# Patient Record
Sex: Male | Born: 1965
Health system: Southern US, Community
[De-identification: ages and names within clinical notes are randomized; demographics above are authoritative.]

## PROBLEM LIST (undated history)

## (undated) DIAGNOSIS — R011 Cardiac murmur, unspecified: Secondary | ICD-10-CM

## (undated) DIAGNOSIS — K219 Gastro-esophageal reflux disease without esophagitis: Secondary | ICD-10-CM

## (undated) DIAGNOSIS — J301 Allergic rhinitis due to pollen: Secondary | ICD-10-CM

## (undated) DIAGNOSIS — M1990 Unspecified osteoarthritis, unspecified site: Secondary | ICD-10-CM

## (undated) HISTORY — DX: Gastro-esophageal reflux disease without esophagitis: K21.9

## (undated) HISTORY — DX: Unspecified osteoarthritis, unspecified site: M19.90

## (undated) HISTORY — PX: SKIN CANCER EXCISION: SHX779

## (undated) HISTORY — PX: BILATERAL KNEE ARTHROSCOPY: SUR91

## (undated) HISTORY — DX: Cardiac murmur, unspecified: R01.1

## (undated) HISTORY — DX: Allergic rhinitis due to pollen: J30.1

## (undated) HISTORY — PX: ROTATOR CUFF REPAIR: SHX139

---

## 2003-04-22 ENCOUNTER — Encounter: Admission: RE | Admit: 2003-04-22 | Discharge: 2003-04-22 | Payer: Self-pay | Admitting: Family Medicine

## 2008-04-24 ENCOUNTER — Encounter: Payer: Self-pay | Admitting: Family Medicine

## 2008-07-17 ENCOUNTER — Ambulatory Visit: Payer: Self-pay | Admitting: Family Medicine

## 2008-07-17 DIAGNOSIS — J019 Acute sinusitis, unspecified: Secondary | ICD-10-CM | POA: Insufficient documentation

## 2008-07-17 DIAGNOSIS — K219 Gastro-esophageal reflux disease without esophagitis: Secondary | ICD-10-CM | POA: Insufficient documentation

## 2008-07-17 DIAGNOSIS — E119 Type 2 diabetes mellitus without complications: Secondary | ICD-10-CM | POA: Insufficient documentation

## 2008-08-12 ENCOUNTER — Ambulatory Visit: Payer: Self-pay | Admitting: Family Medicine

## 2008-10-16 ENCOUNTER — Ambulatory Visit: Payer: Self-pay | Admitting: Family Medicine

## 2008-10-16 DIAGNOSIS — M25519 Pain in unspecified shoulder: Secondary | ICD-10-CM | POA: Insufficient documentation

## 2008-10-16 LAB — CONVERTED CEMR LAB: Hgb A1c MFr Bld: 6.7 % — ABNORMAL HIGH (ref 4.6–6.5)

## 2009-03-25 ENCOUNTER — Telehealth: Payer: Self-pay | Admitting: Family Medicine

## 2009-04-17 ENCOUNTER — Ambulatory Visit: Payer: Self-pay | Admitting: Family Medicine

## 2009-04-17 LAB — CONVERTED CEMR LAB
ALT: 30 units/L (ref 0–53)
AST: 18 units/L (ref 0–37)
Albumin: 3.7 g/dL (ref 3.5–5.2)
Alkaline Phosphatase: 55 units/L (ref 39–117)
BUN: 18 mg/dL (ref 6–23)
Basophils Absolute: 0 10*3/uL (ref 0.0–0.1)
Basophils Relative: 0.5 % (ref 0.0–3.0)
Bilirubin, Direct: 0.1 mg/dL (ref 0.0–0.3)
Blood in Urine, dipstick: NEGATIVE
CO2: 30 meq/L (ref 19–32)
Calcium: 9.1 mg/dL (ref 8.4–10.5)
Chloride: 105 meq/L (ref 96–112)
Cholesterol: 159 mg/dL (ref 0–200)
Creatinine, Ser: 1 mg/dL (ref 0.4–1.5)
Creatinine,U: 145 mg/dL
Eosinophils Absolute: 0.1 10*3/uL (ref 0.0–0.7)
Eosinophils Relative: 1.5 % (ref 0.0–5.0)
GFR calc non Af Amer: 86.94 mL/min (ref >60)
Glucose, Bld: 139 mg/dL — ABNORMAL HIGH (ref 70–99)
Glucose, Urine, Semiquant: NEGATIVE
HCT: 38.9 % — ABNORMAL LOW (ref 39.0–52.0)
HDL: 41 mg/dL (ref >39.00)
Hemoglobin: 13.2 g/dL (ref 13.0–17.0)
Hgb A1c MFr Bld: 6.7 % — ABNORMAL HIGH (ref 4.6–6.5)
Ketones, urine, test strip: NEGATIVE
LDL Cholesterol: 109 mg/dL — ABNORMAL HIGH (ref 0–99)
Lymphocytes Relative: 27.5 % (ref 12.0–46.0)
Lymphs Abs: 1.1 10*3/uL (ref 0.7–4.0)
MCHC: 33.9 g/dL (ref 30.0–36.0)
MCV: 93.5 fL (ref 78.0–100.0)
Microalb Creat Ratio: 2.8 mg/g (ref 0.0–30.0)
Microalb, Ur: 0.4 mg/dL (ref 0.0–1.9)
Monocytes Absolute: 0.3 10*3/uL (ref 0.1–1.0)
Monocytes Relative: 7.3 % (ref 3.0–12.0)
Neutro Abs: 2.6 10*3/uL (ref 1.4–7.7)
Neutrophils Relative %: 63.2 % (ref 43.0–77.0)
Nitrite: NEGATIVE
Platelets: 243 10*3/uL (ref 150.0–400.0)
Potassium: 4.8 meq/L (ref 3.5–5.1)
Protein, U semiquant: NEGATIVE
RBC: 4.16 M/uL — ABNORMAL LOW (ref 4.22–5.81)
RDW: 12.1 % (ref 11.5–14.6)
Sodium: 141 meq/L (ref 135–145)
Specific Gravity, Urine: 1.02
TSH: 0.98 microintl units/mL (ref 0.35–5.50)
Total Bilirubin: 0.7 mg/dL (ref 0.3–1.2)
Total CHOL/HDL Ratio: 4
Total Protein: 6.1 g/dL (ref 6.0–8.3)
Triglycerides: 45 mg/dL (ref 0.0–149.0)
Urobilinogen, UA: 0.2
VLDL: 9 mg/dL (ref 0.0–40.0)
WBC Urine, dipstick: NEGATIVE
WBC: 4.1 10*3/uL — ABNORMAL LOW (ref 4.5–10.5)
pH: 5.5

## 2009-05-05 ENCOUNTER — Ambulatory Visit: Payer: Self-pay | Admitting: Family Medicine

## 2009-06-27 ENCOUNTER — Ambulatory Visit: Payer: Self-pay | Admitting: Family Medicine

## 2009-06-27 DIAGNOSIS — J069 Acute upper respiratory infection, unspecified: Secondary | ICD-10-CM | POA: Insufficient documentation

## 2009-10-01 ENCOUNTER — Encounter: Payer: Self-pay | Admitting: Family Medicine

## 2009-11-03 ENCOUNTER — Ambulatory Visit: Payer: Self-pay | Admitting: Family Medicine

## 2009-11-05 LAB — CONVERTED CEMR LAB: Hgb A1c MFr Bld: 7.2 % — ABNORMAL HIGH (ref 4.6–6.5)

## 2010-03-10 ENCOUNTER — Ambulatory Visit: Payer: Self-pay | Admitting: Family Medicine

## 2010-03-11 LAB — CONVERTED CEMR LAB: Hgb A1c MFr Bld: 7 % — ABNORMAL HIGH (ref 4.6–6.5)

## 2010-04-22 ENCOUNTER — Ambulatory Visit
Admission: RE | Admit: 2010-04-22 | Discharge: 2010-04-22 | Payer: Self-pay | Source: Home / Self Care | Attending: Internal Medicine | Admitting: Internal Medicine

## 2010-04-27 ENCOUNTER — Other Ambulatory Visit: Payer: Self-pay | Admitting: Family Medicine

## 2010-04-27 ENCOUNTER — Ambulatory Visit
Admission: RE | Admit: 2010-04-27 | Discharge: 2010-04-27 | Payer: Self-pay | Source: Home / Self Care | Attending: Family Medicine | Admitting: Family Medicine

## 2010-04-27 LAB — HEPATIC FUNCTION PANEL
ALT: 20 U/L (ref 0–53)
AST: 18 U/L (ref 0–37)
Albumin: 4.1 g/dL (ref 3.5–5.2)
Alkaline Phosphatase: 61 U/L (ref 39–117)
Bilirubin, Direct: 0.1 mg/dL (ref 0.0–0.3)
Total Bilirubin: 0.8 mg/dL (ref 0.3–1.2)
Total Protein: 6.7 g/dL (ref 6.0–8.3)

## 2010-04-27 LAB — LIPID PANEL
Cholesterol: 163 mg/dL (ref 0–200)
HDL: 39.4 mg/dL (ref >39.00)
LDL Cholesterol: 112 mg/dL — ABNORMAL HIGH (ref 0–99)
Total CHOL/HDL Ratio: 4
Triglycerides: 58 mg/dL (ref 0.0–149.0)
VLDL: 11.6 mg/dL (ref 0.0–40.0)

## 2010-04-27 LAB — CBC WITH DIFFERENTIAL/PLATELET
Basophils Absolute: 0 10*3/uL (ref 0.0–0.1)
Basophils Relative: 0.3 % (ref 0.0–3.0)
Eosinophils Absolute: 0 10*3/uL (ref 0.0–0.7)
Eosinophils Relative: 1 % (ref 0.0–5.0)
HCT: 37.8 % — ABNORMAL LOW (ref 39.0–52.0)
Hemoglobin: 13 g/dL (ref 13.0–17.0)
Lymphocytes Relative: 23.9 % (ref 12.0–46.0)
Lymphs Abs: 1.2 10*3/uL (ref 0.7–4.0)
MCHC: 34.5 g/dL (ref 30.0–36.0)
MCV: 92.1 fl (ref 78.0–100.0)
Monocytes Absolute: 0.3 10*3/uL (ref 0.1–1.0)
Monocytes Relative: 5.9 % (ref 3.0–12.0)
Neutro Abs: 3.5 10*3/uL (ref 1.4–7.7)
Neutrophils Relative %: 68.9 % (ref 43.0–77.0)
Platelets: 248 10*3/uL (ref 150.0–400.0)
RBC: 4.1 Mil/uL — ABNORMAL LOW (ref 4.22–5.81)
RDW: 12.4 % (ref 11.5–14.6)
WBC: 5.1 10*3/uL (ref 4.5–10.5)

## 2010-04-27 LAB — CONVERTED CEMR LAB
Glucose, Urine, Semiquant: NEGATIVE
Nitrite: NEGATIVE
Specific Gravity, Urine: 1.015
WBC Urine, dipstick: NEGATIVE

## 2010-04-27 LAB — TSH: TSH: 1.49 u[IU]/mL (ref 0.35–5.50)

## 2010-04-27 LAB — MICROALBUMIN / CREATININE URINE RATIO
Creatinine,U: 80.2 mg/dL
Microalb Creat Ratio: 0.2 mg/g (ref 0.0–30.0)
Microalb, Ur: 0.2 mg/dL (ref 0.0–1.9)

## 2010-04-27 LAB — BASIC METABOLIC PANEL
BUN: 21 mg/dL (ref 6–23)
CO2: 28 mEq/L (ref 19–32)
Calcium: 9.3 mg/dL (ref 8.4–10.5)
Chloride: 105 mEq/L (ref 96–112)
Creatinine, Ser: 0.9 mg/dL (ref 0.4–1.5)
GFR: 98.97 mL/min (ref >60.00)
Glucose, Bld: 130 mg/dL — ABNORMAL HIGH (ref 70–99)
Potassium: 4.5 mEq/L (ref 3.5–5.1)
Sodium: 141 mEq/L (ref 135–145)

## 2010-04-27 LAB — HEMOGLOBIN A1C: Hgb A1c MFr Bld: 7 % — ABNORMAL HIGH (ref 4.6–6.5)

## 2010-05-11 ENCOUNTER — Ambulatory Visit
Admission: RE | Admit: 2010-05-11 | Discharge: 2010-05-11 | Payer: Self-pay | Source: Home / Self Care | Attending: Family Medicine | Admitting: Family Medicine

## 2010-05-11 DIAGNOSIS — M7711 Lateral epicondylitis, right elbow: Secondary | ICD-10-CM | POA: Insufficient documentation

## 2010-05-19 NOTE — Letter (Signed)
Summary: Physical Examination for [facility]  Physical Examination for [place] [facility]   Imported By: Maryln Gottron 10/02/2009 13:06:45  _____________________________________________________________________  External Attachment:    Type:   Image     Comment:   External Document

## 2010-05-19 NOTE — Assessment & Plan Note (Signed)
Summary: cpx/cjr/pt rsc/cjr   Vital Signs:  Spencer Duffy profile:   45 year old male Height:      72 inches Weight:      194 pounds BMI:     26.41 Temp:     97.8 degrees F oral Pulse rate:   80 / minute Pulse rhythm:   regular Resp:     12 per minute BP sitting:   110 / 82  (left arm) Cuff size:   regular  Vitals Entered By: Sid Falcon LPN (May 05, 2009 8:55 AM) CC: CPX, labs done   History of Present Illness: Spencer Duffy here for complete physical.  Limited exercise recently secondary to shoulder pain. Has seen orthopedist and scheduled for surgery of right rotator cuff in 2 days. Blood sugars relatively stable. Currently on Avandamet and has questions about possible change to Actos met. Other medications reviewed. Compliant with all medications.  Several immunizations recently through health department for upcoming travel. Tetanus updated November 2010.  Family hx and social hx reviewed and no changes.  Allergies (verified): No Known Drug Allergies  Past History:  Past Medical History: Last updated: 07/17/2008 Arthritis Diabetes GERD h Hay Fever/Allergies Heart Murmur  Past Surgical History: Last updated: 07/17/2008 Bil knee scope twice Skin CA on chest May 2009  Family History: Last updated: 07/17/2008 Arthritis Family History Hypertension Diabetes Parent, Grandparent  Social History: Last updated: 07/17/2008 Occupation:   Married Never Smoked Alcohol use-no  Review of Systems  The Spencer Duffy denies anorexia, fever, weight loss, weight gain, vision loss, decreased hearing, hoarseness, chest pain, syncope, dyspnea on exertion, peripheral edema, prolonged cough, headaches, hemoptysis, abdominal pain, melena, hematochezia, severe indigestion/heartburn, hematuria, incontinence, genital sores, muscle weakness, suspicious skin lesions, transient blindness, difficulty walking, depression, unusual weight change, enlarged lymph nodes, and testicular masses.     Physical Exam  General:  Well-developed,well-nourished,in no acute distress; alert,appropriate and cooperative throughout examination Head:  Normocephalic and atraumatic without obvious abnormalities. No apparent alopecia or balding. Eyes:  No corneal or conjunctival inflammation noted. EOMI. Perrla. Funduscopic exam benign, without hemorrhages, exudates or papilledema. Vision grossly normal. Ears:  External ear exam shows no significant lesions or deformities.  Otoscopic examination reveals clear canals, tympanic membranes are intact bilaterally without bulging, retraction, inflammation or discharge. Hearing is grossly normal bilaterally. Nose:  External nasal examination shows no deformity or inflammation. Nasal mucosa are pink and moist without lesions or exudates. Mouth:  Oral mucosa and oropharynx without lesions or exudates.  Teeth in good repair. Neck:  No deformities, masses, or tenderness noted. Chest Wall:  No deformities, masses, tenderness or gynecomastia noted. Lungs:  Normal respiratory effort, chest expands symmetrically. Lungs are clear to auscultation, no crackles or wheezes. Heart:  Normal rate and regular rhythm. S1 and S2 normal without gallop, murmur, click, rub or other extra sounds. Abdomen:  Bowel sounds positive,abdomen soft and non-tender without masses, organomegaly or hernias noted. Genitalia:  Testes bilaterally descended without nodularity, tenderness or masses. No scrotal masses or lesions. No penis lesions or urethral discharge. Msk:  No deformity or scoliosis noted of thoracic or lumbar spine.   Extremities:  No clubbing, cyanosis, edema, or deformity noted with normal full range of motion of all joints.   Neurologic:  No cranial nerve deficits noted. Station and gait are normal. Plantar reflexes are down-going bilaterally. DTRs are symmetrical throughout. Sensory, motor and coordinative functions appear intact. Skin:  no concerning skin lesions noted. Cervical  Nodes:  No lymphadenopathy noted Psych:  Cognition and judgment appear intact.  Alert and cooperative with normal attention span and concentration. No apparent delusions, illusions, hallucinations   Impression & Recommendations:  Problem # 1:  Preventive Health Care (ICD-V70.0) get back to regular exercise. Switch from Avandamet to Actos met  Complete Medication List: 1)  Altace 5 Mg Tabs (Ramipril) .... Once daily 2)  Metformin Hcl 500 Mg Tabs (Metformin hcl) .... Once daily 3)  Centrum Ultra Mens Tabs (Multiple vitamins-minerals) .... Once daily 4)  Claritin 10 Mg Tabs (Loratadine) .... Once daily 5)  Ambien 10 Mg Tabs (Zolpidem tartrate) .... As needed 6)  Promethazine Hcl 25 Mg Tabs (Promethazine hcl) .... One tab by mouth as needed nausea 7)  Promethegan 25 Mg Supp (Promethazine hcl) .... Rectal suppository every 4-6 hours as needed nausea 8)  Actoplus Met Xr 15-1000 Mg Xr24h-tab (Pioglitazone hcl-metformin hcl) .... Once daily  Spencer Duffy Instructions: 1)  Please schedule a follow-up appointment in 6 months .  2)  It is important that you exercise reguarly at least 20 minutes 5 times a week. If you develop chest pain, have severe difficulty breathing, or feel very tired, stop exercising immediately and seek medical attention.  3)  Check your blood sugars regularly. If your readings are usually above:  or below 70 you should contact our office.  4)  It is important that your diabetic A1c level is checked every 3 months.  5)  See your eye doctor yearly to check for diabetic eye damage. 6)  Check your feet each night  for sore areas, calluses or signs of infection.  Prescriptions: ACTOPLUS MET XR 15-1000 MG XR24H-TAB (PIOGLITAZONE HCL-METFORMIN HCL) once daily  #90 x 3   Entered and Authorized by:   Evelena Peat MD   Signed by:   Evelena Peat MD on 05/05/2009   Method used:   Electronically to        [facility]* (mail-order)             ,          Ph: [phone]        Fax: [phone]   RxID:   [id number]   Preventive Care Screening  Last Tetanus Booster:    Date:  02/17/2009    Results:  Historical     Immunization History:  Hepatitis B Immunization History:    Hepatitis B # 1:  historical (03/05/2009)    Hepatitis B # 2:  historical (04/07/2009)  Hepatitis B History:    Hepatitis B # 7:  yes (03/05/2009)  Hepatitis A Immunization History:    Hepatitis A # 1:  historical (06/17/2000)    Hepatitis A # 2:  historical (01/17/2001)

## 2010-05-19 NOTE — Assessment & Plan Note (Signed)
Summary: ?sinus inf/njr   Vital Signs:  Patient profile:   45 year old male Temp:     98 degrees F BP sitting:   100 / 70  (left arm) Cuff size:   regular  Vitals Entered By: Sid Falcon LPN (June 27, 2009 4:21 PM) CC: cough, congestion X 1 week   History of Present Illness: Acute visit. Onset about 8 days ago of URI type symptoms with sinus congestion, sore throat and dry cough. Mild body aches. No fever. Cough worsened at night. Not relieved with NyQuil. Patient is nonsmoker.  No dyspnea.    Allergies (verified): No Known Drug Allergies  Past History:  Past Medical History: Last updated: 07/17/2008 Arthritis Diabetes GERD h Hay Fever/Allergies Heart Murmur PMH reviewed for relevance  Review of Systems      See HPI  Physical Exam  General:  Well-developed,well-nourished,in no acute distress; alert,appropriate and cooperative throughout examination Ears:  External ear exam shows no significant lesions or deformities.  Otoscopic examination reveals clear canals, tympanic membranes are intact bilaterally without bulging, retraction, inflammation or discharge. Hearing is grossly normal bilaterally. Nose:  External nasal examination shows no deformity or inflammation. Nasal mucosa are pink and moist without lesions or exudates. Mouth:  Oral mucosa and oropharynx without lesions or exudates.  Teeth in good repair. Neck:  No deformities, masses, or tenderness noted. Lungs:  Normal respiratory effort, chest expands symmetrically. Lungs are clear to auscultation, no crackles or wheezes. Heart:  Normal rate and regular rhythm. S1 and S2 normal without gallop, murmur, click, rub or other extra sounds.   Impression & Recommendations:  Problem # 1:  VIRAL URI (ICD-465.9) cough med for symptom relief and f/u as needed if worsening symptoms. His updated medication list for this problem includes:    Claritin 10 Mg Tabs (Loratadine) ..... Once daily    Promethazine Hcl 25 Mg  Tabs (Promethazine hcl) ..... One tab by mouth as needed nausea    Promethegan 25 Mg Supp (Promethazine hcl) .Marland Kitchen... Rectal suppository every 4-6 hours as needed nausea    Hydrocodone-homatropine 5-1.5 Mg/49ml Syrp (Hydrocodone-homatropine) ..... One tsp by mouth q4-6 hours as needed cough  Complete Medication List: 1)  Altace 5 Mg Tabs (Ramipril) .... Once daily 2)  Metformin Hcl 500 Mg Tabs (Metformin hcl) .... Once daily 3)  Centrum Ultra Mens Tabs (Multiple vitamins-minerals) .... Once daily 4)  Claritin 10 Mg Tabs (Loratadine) .... Once daily 5)  Ambien 10 Mg Tabs (Zolpidem tartrate) .... As needed 6)  Promethazine Hcl 25 Mg Tabs (Promethazine hcl) .... One tab by mouth as needed nausea 7)  Promethegan 25 Mg Supp (Promethazine hcl) .... Rectal suppository every 4-6 hours as needed nausea 8)  Actoplus Met Xr 15-1000 Mg Xr24h-tab (Pioglitazone hcl-metformin hcl) .... Once daily 9)  Hydrocodone-homatropine 5-1.5 Mg/80ml Syrp (Hydrocodone-homatropine) .... One tsp by mouth q4-6 hours as needed cough  Patient Instructions: 1)  Get plenty of rest, drink lots of clear liquids, and use Tylenol or Ibuprofen for fever and comfort. Return in 7-10 days if you're not better: sooner if you'er feeling worse.  Prescriptions: AMBIEN 10 MG TABS (ZOLPIDEM TARTRATE) as needed  #30 x 3   Entered and Authorized by:   Evelena Peat MD   Signed by:   Evelena Peat MD on 06/27/2009   Method used:   Print then Give to Patient   RxID:   [id number] HYDROCODONE-HOMATROPINE 5-1.5 MG/5ML SYRP (HYDROCODONE-HOMATROPINE) one tsp by mouth q4-6 hours as needed cough  #120 ml x  0   Entered and Authorized by:   Evelena Peat MD   Signed by:   Evelena Peat MD on 06/27/2009   Method used:   Print then Give to Patient   RxID:   [id number]

## 2010-05-19 NOTE — Assessment & Plan Note (Signed)
Summary: 6 mo rov/mm   Vital Signs:  Patient profile:   45 year old male Weight:      203 pounds Temp:     98.0 degrees F oral BP sitting:   110 / 70  (left arm) Cuff size:   large  Vitals Entered By: Sid Falcon LPN (November 03, 2009 8:42 AM) CC: 6 month follow-up   History of Present Illness: Here for medical follow up. Less exercise since last visit and some weight gain. CBGs stable to slightly increased.  No sxs of hyperglycemia.  No chest pains.  Episode last week of severe cramps after working outside for several hours. Resolved with fluid replacement.  Allergies (verified): No Known Drug Allergies  Past History:  Past Medical History: Arthritis Diabetes-Type 2 GERD h Hay Fever/Allergies Heart Murmur  Review of Systems       The patient complains of weight gain.  The patient denies weight loss, chest pain, syncope, dyspnea on exertion, and peripheral edema.    Physical Exam  General:  Well-developed,well-nourished,in no acute distress; alert,appropriate and cooperative throughout examination Head:  Normocephalic and atraumatic without obvious abnormalities. No apparent alopecia or balding. Eyes:  pupils equal, pupils round, and pupils reactive to light.   Neck:  No deformities, masses, or tenderness noted. Lungs:  Normal respiratory effort, chest expands symmetrically. Lungs are clear to auscultation, no crackles or wheezes. Heart:  Normal rate and regular rhythm. S1 and S2 normal without gallop, murmur, click, rub or other extra sounds.  Diabetes Management Exam:    Foot Exam (with socks and/or shoes not present):       Sensory-Pinprick/Light touch:          Left medial foot (L-4): normal          Left dorsal foot (L-5): normal          Left lateral foot (S-1): normal          Right medial foot (L-4): normal          Right dorsal foot (L-5): normal          Right lateral foot (S-1): normal       Sensory-Monofilament:          Left foot: normal   Right foot: normal       Inspection:          Left foot: normal          Right foot: normal       Nails:          Left foot: normal          Right foot: normal    Eye Exam:       Eye Exam done elsewhere          Date: 07/18/2009          Results: normal   Impression & Recommendations:  Problem # 1:  AODM (ICD-250.00) Assessment Unchanged  His updated medication list for this problem includes:    Altace 5 Mg Tabs (Ramipril) ..... Once daily    Metformin Hcl 500 Mg Tabs (Metformin hcl) ..... Once daily    Actoplus Met Xr 15-1000 Mg Xr24h-tab (Pioglitazone hcl-metformin hcl) ..... Once daily  Orders: Venipuncture (16109) TLB-A1C / Hgb A1C (Glycohemoglobin) (83036-A1C)  Complete Medication List: 1)  Altace 5 Mg Tabs (Ramipril) .... Once daily 2)  Metformin Hcl 500 Mg Tabs (Metformin hcl) .... Once daily 3)  Centrum Ultra Mens Tabs (Multiple vitamins-minerals) .... Once daily 4)  Claritin  10 Mg Tabs (Loratadine) .... Once daily 5)  Ambien 10 Mg Tabs (Zolpidem tartrate) .... As needed 6)  Promethazine Hcl 25 Mg Tabs (Promethazine hcl) .... One tab by mouth as needed nausea 7)  Promethegan 25 Mg Supp (Promethazine hcl) .... Rectal suppository every 4-6 hours as needed nausea 8)  Actoplus Met Xr 15-1000 Mg Xr24h-tab (Pioglitazone hcl-metformin hcl) .... Once daily  Patient Instructions: 1)  Please schedule a follow-up appointment in 6 months .  2)  Check your blood sugars regularly. If your readings are usually above:  or below 70 you should contact our office.  3)  It is important that your diabetic A1c level is checked every 3 months.  4)  See your eye doctor yearly to check for diabetic eye damage. 5)  Check your feet each night  for sore areas, calluses or signs of infection.

## 2010-05-21 NOTE — Assessment & Plan Note (Signed)
Summary: HURT ARM OVER THE WEEKEND//SLM   Vital Signs:  Patient profile:   45 year old male Weight:      198 pounds BMI:     26.95 Pulse rate:   60 / minute BP sitting:   98 / 60  (left arm)  Vitals Entered By: Kyung Rudd, CMA (April 22, 2010 10:20 AM) CC: arm began hurting on Saturday...can't bend it or pick up anything larger than a few ounce. Pain in elbow and radiates outward. constant pain   CC:  arm began hurting on Saturday...can't bend it or pick up anything larger than a few ounce. Pain in elbow and radiates outward. constant pain.  History of Present Illness: Patient presents to clinic as a workin for evaluation of arm pain. Notes 5d h/o right arm pain located along lateral elbow. No injury/trauma, radicular arm pain or joint erythema/swelling. Did attempt motrin 600-800mg  on a few occasions without improvement. No h/o previous elbow pain. No alleviating or exacerbating factors.   Current Medications (verified): 1)  Altace 5 Mg Tabs (Ramipril) .... Once Daily 2)  Metformin Hcl 500 Mg Tabs (Metformin Hcl) .... Once Daily 3)  Centrum Ultra Mens  Tabs (Multiple Vitamins-Minerals) .... Once Daily 4)  Claritin 10 Mg Tabs (Loratadine) .... Once Daily 5)  Ambien 10 Mg Tabs (Zolpidem Tartrate) .... As Needed 6)  Promethazine Hcl 25 Mg Tabs (Promethazine Hcl) .... One Tab By Mouth As Needed Nausea 7)  Promethegan 25 Mg Supp (Promethazine Hcl) .... Rectal Suppository Every 4-6 Hours As Needed Nausea 8)  Actoplus Met Xr 15-1000 Mg Xr24h-Tab (Pioglitazone Hcl-Metformin Hcl) .... Once Daily  Allergies (verified): No Known Drug Allergies  Past History:  Past medical, surgical, family and social histories (including risk factors) reviewed for relevance to current acute and chronic problems.  Past Medical History: Reviewed history from 11/03/2009 and no changes required. Arthritis Diabetes-Type 2 GERD h Hay Fever/Allergies Heart Murmur  Past Surgical History: Reviewed  history from 07/17/2008 and no changes required. Bil knee scope twice Skin CA on chest May 2009  Family History: Reviewed history from 07/17/2008 and no changes required. Arthritis Family History Hypertension Diabetes Parent, Grandparent  Social History: Reviewed history from 07/17/2008 and no changes required. Occupation:   Married Never Smoked Alcohol use-no  Review of Systems      See HPI General:  Denies chills and fever. GI:  Denies abdominal pain, bloody stools, and indigestion. MS:  Complains of joint pain; denies joint redness, joint swelling, and loss of strength.  Physical Exam  General:  Well-developed,well-nourished,in no acute distress; alert,appropriate and cooperative throughout examination Head:  Normocephalic and atraumatic without obvious abnormalities. No apparent alopecia or balding. Eyes:  pupils equal, pupils round, corneas and lenses clear, and no injection.   Ears:  no external deformities.   Nose:  no external deformity.   Msk:  Right elbow demonstrates no erythema, warmth or effusion. No tenderness at elbow. ROM only limited by pain. + tenderness at right lateral epicondyl.  left elbow and epicondyl nl   Impression & Recommendations:  Problem # 1:  LATERAL EPICONDYLITIS OF ELBOW (ICD-726.32) Assessment New Stop motrin. Begin voltaren x 1week with food and no other nsaids. Vicodin as needed pain temporarily. Followup if no improvement or worsening.  Complete Medication List: 1)  Altace 5 Mg Tabs (Ramipril) .... Once daily 2)  Metformin Hcl 500 Mg Tabs (Metformin hcl) .... Once daily 3)  Centrum Ultra Mens Tabs (Multiple vitamins-minerals) .... Once daily 4)  Claritin 10  Mg Tabs (Loratadine) .... Once daily 5)  Ambien 10 Mg Tabs (Zolpidem tartrate) .... As needed 6)  Promethazine Hcl 25 Mg Tabs (Promethazine hcl) .... One tab by mouth as needed nausea 7)  Promethegan 25 Mg Supp (Promethazine hcl) .... Rectal suppository every 4-6 hours as needed  nausea 8)  Actoplus Met Xr 15-1000 Mg Xr24h-tab (Pioglitazone hcl-metformin hcl) .... Once daily 9)  Hydrocodone-acetaminophen 5-500 Mg Tabs (Hydrocodone-acetaminophen) .... One by mouth q6 hours as needed pain 10)  Diclofenac Sodium 75 Mg Tbec (Diclofenac sodium) .... One by mouth two times a day x 7days then one by mouth two times a day as needed arm pain Prescriptions: DICLOFENAC SODIUM 75 MG TBEC (DICLOFENAC SODIUM) one by mouth two times a day x 7days then one by mouth two times a day as needed arm pain  #30 x 0   Entered and Authorized by:   Edwyna Perfect MD   Signed by:   Edwyna Perfect MD on 04/22/2010   Method used:   Print then Give to Patient   RxID:   [id number] HYDROCODONE-ACETAMINOPHEN 5-500 MG TABS (HYDROCODONE-ACETAMINOPHEN) one by mouth q6 hours as needed pain  #15 x 0   Entered and Authorized by:   Edwyna Perfect MD   Signed by:   Edwyna Perfect MD on 04/22/2010   Method used:   Print then Give to Patient   RxID:   [id number]    Orders Added: 1)  Est. Patient Level III [53664]

## 2010-05-21 NOTE — Assessment & Plan Note (Signed)
Summary: cpx//slm/pt rsc/cjr   Vital Signs:  Patient profile:   45 year old male Height:      72 inches Weight:      199 pounds Temp:     98.4 degrees F oral BP sitting:   110 / 70  (left arm) Cuff size:   large  Vitals Entered By: Sid Falcon LPN (May 11, 2010 8:59 AM)  History of Present Illness: Here for CPE.  Not exercising regularly and poor diet compliance at times. CBGs have been stable.    medications have been reviewed. He is compliant with all. No recent hypoglycemic symptoms and no hyperglycemic symptoms. Denies any chest pains or dizziness.  Social history family history reviewed no significant changes  New separate problem of right elbow pain. Present for several months. No injury. Worse with gripping. Try diclofenac which helped minimally. Icing inconsistently. Location of pain lateral elbow. Deep achy pain. Moderate severity  Lipid Management History:      Positive NCEP/ATP III risk factors include diabetes and HDL cholesterol less than 40.  Negative NCEP/ATP III risk factors include male age less than 64 years old, non-tobacco-user status, non-hypertensive, no ASHD (atherosclerotic heart disease), no prior stroke/TIA, no peripheral vascular disease, and no history of aortic aneurysm.     Clinical Review Panels:  Immunizations   Last Tetanus Booster:  Historical (02/17/2009)   Last Flu Vaccine:  Historical (02/07/2009)  Lipid Management   Cholesterol:  163 (04/27/2010)   LDL (bad choesterol):  112 (04/27/2010)   HDL (good cholesterol):  39.40 (04/27/2010)  Diabetes Management   HgBA1C:  7.0 (04/27/2010)   Creatinine:  0.9 (04/27/2010)   Last Foot Exam:  yes (11/03/2009)   Last Flu Vaccine:  Historical (02/07/2009)  CBC   WBC:  5.1 (04/27/2010)   RBC:  4.10 (04/27/2010)   Hgb:  13.0 (04/27/2010)   Hct:  37.8 (04/27/2010)   Platelets:  248.0 (04/27/2010)   MCV  92.1 (04/27/2010)   MCHC  34.5 (04/27/2010)   RDW  12.4 (04/27/2010)   PMN:  68.9  (04/27/2010)   Lymphs:  23.9 (04/27/2010)   Monos:  5.9 (04/27/2010)   Eosinophils:  1.0 (04/27/2010)   Basophil:  0.3 (04/27/2010)  Complete Metabolic Panel   Glucose:  130 (04/27/2010)   Sodium:  141 (04/27/2010)   Potassium:  4.5 (04/27/2010)   Chloride:  105 (04/27/2010)   CO2:  28 (04/27/2010)   BUN:  21 (04/27/2010)   Creatinine:  0.9 (04/27/2010)   Albumin:  4.1 (04/27/2010)   Total Protein:  6.7 (04/27/2010)   Calcium:  9.3 (04/27/2010)   Total Bili:  0.8 (04/27/2010)   Alk Phos:  61 (04/27/2010)   SGPT (ALT):  20 (04/27/2010)   SGOT (AST):  18 (04/27/2010)   Allergies (verified): No Known Drug Allergies  Past History:  Family History: Last updated: 07/17/2008 Arthritis Family History Hypertension Diabetes Parent, Grandparent  Social History: Last updated: 07/17/2008 Occupation:   Married Never Smoked Alcohol use-no  Risk Factors: Smoking Status: never (07/17/2008)  Past Medical History: Arthritis Diabetes-Type 2 GERD Hay Fever/Allergies  Past Surgical History: Bil knee scope twice Dysplastic nevus chest May 2009 PMH-FH-SH reviewed for relevance  Review of Systems  The patient denies anorexia, fever, weight loss, weight gain, vision loss, decreased hearing, hoarseness, chest pain, syncope, dyspnea on exertion, peripheral edema, prolonged cough, headaches, hemoptysis, abdominal pain, melena, hematochezia, severe indigestion/heartburn, hematuria, incontinence, genital sores, muscle weakness, suspicious skin lesions, transient blindness, difficulty walking, depression, unusual weight change, abnormal bleeding, enlarged  lymph nodes, angioedema, and testicular masses.    Physical Exam  General:  Well-developed,well-nourished,in no acute distress; alert,appropriate and cooperative throughout examination Head:  normocephalic and atraumatic.   Eyes:  No corneal or conjunctival inflammation noted. EOMI. Perrla. Funduscopic exam benign, without  hemorrhages, exudates or papilledema. Vision grossly normal. Ears:  External ear exam shows no significant lesions or deformities.  Otoscopic examination reveals clear canals, tympanic membranes are intact bilaterally without bulging, retraction, inflammation or discharge. Hearing is grossly normal bilaterally. Mouth:  Oral mucosa and oropharynx without lesions or exudates.  Teeth in good repair. Neck:  No deformities, masses, or tenderness noted. Lungs:  Normal respiratory effort, chest expands symmetrically. Lungs are clear to auscultation, no crackles or wheezes. Heart:  Normal rate and regular rhythm. S1 and S2 normal without gallop, murmur, click, rub or other extra sounds. Abdomen:  Bowel sounds positive,abdomen soft and non-tender without masses, organomegaly or hernias noted. Rectal:  No external abnormalities noted. Normal sphincter tone. No rectal masses or tenderness. Prostate:  Prostate gland firm and smooth, no enlargement, nodularity, tenderness, mass, asymmetry or induration. Msk:  No deformity or scoliosis noted of thoracic or lumbar spine.   Extremities:  No clubbing, cyanosis, edema, or deformity noted with normal full range of motion of all joints.   Neurologic:  alert & oriented X3, cranial nerves II-XII intact, and strength normal in all extremities.   Skin:  no rashes and no suspicious lesions.   Cervical Nodes:  No lymphadenopathy noted Psych:  Oriented X3, good eye contact, not anxious appearing, and not depressed appearing.     Impression & Recommendations:  Problem # 1:  ROUTINE GENERAL MEDICAL EXAM@HEALTH  CARE FACL (ICD-V70.0) diet discussed.  Needs more regular exercise. Labs reviewed and A1C stable.  LDL slightly above goal and pt reluctant to start statin after full discussion of risk and benefits.    Problem # 2:  LATERAL EPICONDYLITIS (ICD-726.32)  discussed risks and benefits of corticosteroid injection and patient consented. Right elbow prepped with  Betadine. Using 25-gauge 1 inch needle injected 40 mg Depo-Medrol and 1 cc of plain Xylocaine without difficulty. Patient tolerated well. Continue icing 2-3 times daily  Orders: Injection, Tendon / Ligament (16109)  Complete Medication List: 1)  Altace 5 Mg Tabs (Ramipril) .... Once daily 2)  Metformin Hcl 500 Mg Tabs (Metformin hcl) .... Once daily 3)  Centrum Ultra Mens Tabs (Multiple vitamins-minerals) .... Once daily 4)  Claritin 10 Mg Tabs (Loratadine) .... Once daily 5)  Ambien 10 Mg Tabs (Zolpidem tartrate) .... As needed 6)  Actoplus Met Xr 15-1000 Mg Xr24h-tab (Pioglitazone hcl-metformin hcl) .... Once daily  Lipid Assessment/Plan:      The patient's calculated 10 year coronary heart disease risk is 7 %.  Based on NCEP/ATP III, the patient's risk factor category is "history of diabetes".  The patient's lipid goals are as follows: Total cholesterol goal is 200; LDL cholesterol goal is 100; HDL cholesterol goal is 40; Triglyceride goal is 150.     Orders Added: 1)  Est. Patient 40-64 years [99396] 2)  Injection, Tendon / Ligament [20550]

## 2010-05-28 ENCOUNTER — Telehealth: Payer: Self-pay | Admitting: *Deleted

## 2010-05-28 NOTE — Telephone Encounter (Signed)
Pt is still having elbow pain x 2 months, and wants to know what the next steps would be?

## 2010-05-28 NOTE — Telephone Encounter (Signed)
Make sur patient is icing elbow regularly.  Options include one more repeat injection and PT Could refer to orthopedics but they will likely just repeat steroid and above.

## 2010-05-28 NOTE — Telephone Encounter (Signed)
Appt scheduled for the 16 th of Feb for injection.

## 2010-05-29 ENCOUNTER — Ambulatory Visit (INDEPENDENT_AMBULATORY_CARE_PROVIDER_SITE_OTHER): Payer: Self-pay | Admitting: Family Medicine

## 2010-05-29 DIAGNOSIS — Z0289 Encounter for other administrative examinations: Secondary | ICD-10-CM

## 2010-05-29 DIAGNOSIS — Z049 Encounter for examination and observation for unspecified reason: Secondary | ICD-10-CM

## 2010-06-01 NOTE — Progress Notes (Signed)
No show

## 2010-06-03 ENCOUNTER — Encounter: Payer: Self-pay | Admitting: Family Medicine

## 2010-06-04 ENCOUNTER — Encounter: Payer: Self-pay | Admitting: Family Medicine

## 2010-06-04 ENCOUNTER — Ambulatory Visit (INDEPENDENT_AMBULATORY_CARE_PROVIDER_SITE_OTHER): Payer: 59 | Admitting: Family Medicine

## 2010-06-04 VITALS — BP 110/68 | Temp 98.1°F | Ht 71.5 in | Wt 194.0 lb

## 2010-06-04 DIAGNOSIS — M7710 Lateral epicondylitis, unspecified elbow: Secondary | ICD-10-CM

## 2010-06-04 MED ORDER — DICLOFENAC SODIUM 1.5 % TD SOLN: 10.0000 [drp] | Freq: Four times a day (QID) | TRANSDERMAL | Status: DC

## 2010-06-04 NOTE — Progress Notes (Addendum)
  Subjective:    Patient ID: Spencer Duffy, male    DOB: [birth year], 45 y.o.   MRN: [id number]  HPI  persistent soreness right lateral elbow. Worse with wrist extension. Recent cortisone injection without much improvement. Icing about twice daily. No injury or any overuse activities. No redness or warmth. Pain is located lateral elbow and moderate severity. Worse with gripping activities. No weakness.   Review of Systems     Objective:   Physical Exam     Patient has full range motion right elbow. No ecchymosis erythema or warmth. Tenderness over the lateral epicondylar region. Pain which is exacerbated with extension against resistance but not flexion against resistance. Normal sensory function right elbow. Full grip strength    Assessment & Plan:   right lateral epicondylitis. Discussed risk and benefits of the cortisone injection patient consented. Depo-Medrol 20 mg and 1 cc of plain Xylocaine injected. Patient tolerated well. Continue icing, stretching and consider topical diclofenac. Tendon injected was Extensor [employer].

## 2010-07-10 ENCOUNTER — Other Ambulatory Visit: Payer: Self-pay | Admitting: Family Medicine

## 2010-07-14 ENCOUNTER — Encounter: Payer: Self-pay | Admitting: Family Medicine

## 2010-08-11 ENCOUNTER — Other Ambulatory Visit: Payer: Self-pay | Admitting: Family Medicine

## 2010-09-25 ENCOUNTER — Ambulatory Visit (INDEPENDENT_AMBULATORY_CARE_PROVIDER_SITE_OTHER): Payer: 59 | Admitting: Internal Medicine

## 2010-09-25 ENCOUNTER — Encounter: Payer: Self-pay | Admitting: Internal Medicine

## 2010-09-25 VITALS — BP 100/60 | HR 81 | Wt 192.0 lb

## 2010-09-25 DIAGNOSIS — M7710 Lateral epicondylitis, unspecified elbow: Secondary | ICD-10-CM

## 2010-09-25 MED ORDER — OXYCODONE-ACETAMINOPHEN 7.5-500 MG PO TABS: 1.0000 | ORAL_TABLET | Freq: Four times a day (QID) | ORAL | Status: DC | PRN

## 2010-09-25 NOTE — Assessment & Plan Note (Signed)
Persistent symptoms despite appropriate therapy. Attempt pain control Percocet when necessary short-term. Discussed potential side effects. Recommend and will schedule orthopedic consult

## 2010-09-25 NOTE — Progress Notes (Signed)
  Subjective:    Patient ID: Spencer Duffy, male    DOB: 20-Jun-1965, 45 y.o.   MRN: [id number]  HPI Pt presents to clinic for evaluation of elbow pain. Has suffered from several month history of right lateral epicondylar pain and tenderness which limits range of motion. Diagnosed with epicondylitis and has attempted and failed by mouth in sense, topical NSAIDs, steroid injection on two occasions. Currently is taking Motrin 60 mg three times a day over the past four days without improvement and also without GI adverse effect. Pain worsens with pronation and supination. No other alleviating or exacerbating factors. No other complaints.  Reviewed past medical history, medications and allergies.  Review of Systems  Constitutional: Negative for fever and chills.  Musculoskeletal: Positive for arthralgias. Negative for joint swelling.  Skin: Negative for rash and wound.        Objective:   Physical Exam  Nursing note and vitals reviewed. Constitutional: He appears well-developed and well-nourished. No distress.  HENT:  Head: Normocephalic and atraumatic.  Eyes: Conjunctivae are normal. No scleral icterus.  Musculoskeletal:       Point tenderness right lateral epicondylar area. No bony abnormality. Pain reproduced with wrist extension against resistance. Also reproduced with supination and pronation. Elbow without obvious erythema warmth or effusion. Full range of motion limited only by pain  Neurological: He is alert.  Skin: Skin is warm and dry. No rash noted. He is not diaphoretic. No erythema. No pallor.  Psychiatric: He has a normal mood and affect.          Assessment & Plan:

## 2011-05-20 ENCOUNTER — Other Ambulatory Visit: Payer: Self-pay | Admitting: Family Medicine

## 2011-06-04 ENCOUNTER — Other Ambulatory Visit (INDEPENDENT_AMBULATORY_CARE_PROVIDER_SITE_OTHER): Payer: 59

## 2011-06-04 DIAGNOSIS — Z0000 Encounter for general adult medical examination without abnormal findings: Secondary | ICD-10-CM

## 2011-06-04 LAB — BASIC METABOLIC PANEL
BUN: 22 mg/dL (ref 6–23)
CO2: 28 mEq/L (ref 19–32)
Chloride: 105 mEq/L (ref 96–112)
Creatinine, Ser: 0.8 mg/dL (ref 0.4–1.5)

## 2011-06-04 LAB — HEPATIC FUNCTION PANEL
Albumin: 3.8 g/dL (ref 3.5–5.2)
Bilirubin, Direct: 0 mg/dL (ref 0.0–0.3)
Total Protein: 6.3 g/dL (ref 6.0–8.3)

## 2011-06-04 LAB — LIPID PANEL
Cholesterol: 134 mg/dL (ref 0–200)
LDL Cholesterol: 81 mg/dL (ref 0–99)
Triglycerides: 31 mg/dL (ref 0.0–149.0)

## 2011-06-04 LAB — CBC WITH DIFFERENTIAL/PLATELET
Basophils Relative: 0.4 % (ref 0.0–3.0)
Eosinophils Absolute: 0.1 10*3/uL (ref 0.0–0.7)
MCHC: 33.6 g/dL (ref 30.0–36.0)
MCV: 91.4 fl (ref 78.0–100.0)
Monocytes Absolute: 0.3 10*3/uL (ref 0.1–1.0)
Neutrophils Relative %: 61.7 % (ref 43.0–77.0)
Platelets: 231 10*3/uL (ref 150.0–400.0)
RBC: 4.16 Mil/uL — ABNORMAL LOW (ref 4.22–5.81)

## 2011-06-04 LAB — POCT URINALYSIS DIPSTICK
Bilirubin, UA: NEGATIVE
Blood, UA: NEGATIVE
Ketones, UA: NEGATIVE
Leukocytes, UA: NEGATIVE
pH, UA: 6

## 2011-06-04 LAB — MICROALBUMIN / CREATININE URINE RATIO: Microalb Creat Ratio: 0.3 mg/g (ref 0.0–30.0)

## 2011-06-04 LAB — HEMOGLOBIN A1C: Hgb A1c MFr Bld: 6.9 % — ABNORMAL HIGH (ref 4.6–6.5)

## 2011-06-11 ENCOUNTER — Encounter: Payer: Self-pay | Admitting: Family Medicine

## 2011-06-11 ENCOUNTER — Ambulatory Visit (INDEPENDENT_AMBULATORY_CARE_PROVIDER_SITE_OTHER): Payer: 59 | Admitting: Family Medicine

## 2011-06-11 VITALS — BP 118/68 | HR 72 | Temp 98.0°F | Resp 12 | Ht 71.75 in | Wt 188.0 lb

## 2011-06-11 DIAGNOSIS — Z0000 Encounter for general adult medical examination without abnormal findings: Secondary | ICD-10-CM

## 2011-06-11 DIAGNOSIS — D649 Anemia, unspecified: Secondary | ICD-10-CM

## 2011-06-11 LAB — FERRITIN: Ferritin: 78.6 ng/mL (ref 22.0–322.0)

## 2011-06-11 NOTE — Progress Notes (Signed)
Subjective:    Patient ID: Spencer Duffy, male    DOB: 03-18-1966, 46 y.o.   MRN: [id number]  HPI  Complete physical. Patient has history of type 2 diabetes, GERD and has some ongoing problems with lateral epicondylitis which is gradually improving. He had influenza back in December. Full recovery. No history of Pneumovax. Tetanus up to date.  Patient is recently taking nonsteroidals for about 2-3 months related to his lateral epicondylitis. He saw orthopedist for that. No recent abdominal pain. No prior history of anemia. Nonsmoker.  No bloody stools.  Past Medical History  Diagnosis Date  . Arthritis   . Diabetes mellitus     TYPE II  . GERD (gastroesophageal reflux disease)   . Hay fever     ALLERGIES  . Heart murmur    Past Surgical History  Procedure Date  . Bilateral knee arthroscopy     X2  . Skin cancer excision     ON CHEST    reports that he has never smoked. He does not have any smokeless tobacco history on file. He reports that he does not drink alcohol. His drug history not on file. family history includes Arthritis in his other; Diabetes in his other; and Hypertension in his other. No Known Allergies    Review of Systems  Constitutional: Negative for fever, activity change, appetite change and fatigue.  HENT: Negative for ear pain, congestion and trouble swallowing.   Eyes: Negative for pain and visual disturbance.  Respiratory: Negative for cough, shortness of breath and wheezing.   Cardiovascular: Negative for chest pain and palpitations.  Gastrointestinal: Negative for nausea, vomiting, abdominal pain, diarrhea, constipation, blood in stool, abdominal distention and rectal pain.  Genitourinary: Negative for dysuria, hematuria and testicular pain.  Musculoskeletal: Negative for joint swelling and arthralgias.       Patient has had some progressive pains right metatarsophalangeal joint. Had x-rays in another office and apparently had osteoarthritis type changes.    Skin: Negative for rash.  Neurological: Negative for dizziness, syncope and headaches.  Hematological: Negative for adenopathy.  Psychiatric/Behavioral: Negative for confusion and dysphoric mood.       Objective:   Physical Exam  Constitutional: He is oriented to person, place, and time. He appears well-developed and well-nourished. No distress.  HENT:  Head: Normocephalic and atraumatic.  Right Ear: External ear normal.  Left Ear: External ear normal.  Mouth/Throat: Oropharynx is clear and moist.  Eyes: Conjunctivae and EOM are normal. Pupils are equal, round, and reactive to light.  Neck: Normal range of motion. Neck supple. No thyromegaly present.  Cardiovascular: Normal rate, regular rhythm and normal heart sounds.   No murmur heard. Pulmonary/Chest: No respiratory distress. He has no wheezes. He has no rales.  Abdominal: Soft. Bowel sounds are normal. He exhibits no distension and no mass. There is no tenderness. There is no rebound and no guarding.  Genitourinary: Rectum normal and prostate normal.       Hemoccult negative  Musculoskeletal: He exhibits no edema.  Lymphadenopathy:    He has no cervical adenopathy.  Neurological: He is alert and oriented to person, place, and time. He displays normal reflexes. No cranial nerve deficit.  Skin: No rash noted.  Psychiatric: He has a normal mood and affect.          Assessment & Plan:  #1 complete physical. Suggested Pneumovax with his diabetes status. Continue yearly flu vaccine. Continue yearly eye exam. #2 mild anemia by recent labs with hemoglobin 12.8. Normocytic.  Hemoccult negative today.  Obtain serum iron and ferritin level. If low consider GI referral. Avoid nonsteroidals. Hemoccult cards given

## 2011-06-11 NOTE — Patient Instructions (Addendum)
Continue with yearly eye exams Consider pneumonia vaccine (pneumovax)

## 2011-06-15 NOTE — Progress Notes (Signed)
Quick Note:  Pt informed on home personally identified VM ______ 

## 2011-06-22 ENCOUNTER — Other Ambulatory Visit (INDEPENDENT_AMBULATORY_CARE_PROVIDER_SITE_OTHER): Payer: 59

## 2011-06-22 DIAGNOSIS — IMO0001 Reserved for inherently not codable concepts without codable children: Secondary | ICD-10-CM

## 2011-06-22 DIAGNOSIS — K921 Melena: Secondary | ICD-10-CM

## 2011-06-22 LAB — HEMOCCULT GUIAC POC 1CARD (OFFICE)
Card #1 Date: NEGATIVE
Card #3 Date: NEGATIVE
Fecal Occult Blood, POC: NEGATIVE

## 2011-06-23 ENCOUNTER — Other Ambulatory Visit: Payer: 59

## 2011-06-23 NOTE — Progress Notes (Signed)
Quick Note:  Pt informed on personally identified VM ______ 

## 2011-06-26 ENCOUNTER — Other Ambulatory Visit: Payer: Self-pay | Admitting: Family Medicine

## 2011-10-08 ENCOUNTER — Encounter: Payer: Self-pay | Admitting: Family Medicine

## 2011-10-08 ENCOUNTER — Ambulatory Visit (INDEPENDENT_AMBULATORY_CARE_PROVIDER_SITE_OTHER): Payer: 59 | Admitting: Family Medicine

## 2011-10-08 VITALS — BP 118/72 | Temp 98.0°F | Wt 195.0 lb

## 2011-10-08 DIAGNOSIS — M79673 Pain in unspecified foot: Secondary | ICD-10-CM

## 2011-10-08 DIAGNOSIS — M79609 Pain in unspecified limb: Secondary | ICD-10-CM

## 2011-10-08 MED ORDER — OXYCODONE-ACETAMINOPHEN 7.5-500 MG PO TABS: 1.0000 | ORAL_TABLET | Freq: Four times a day (QID) | ORAL | Status: DC | PRN

## 2011-10-08 MED ORDER — GABAPENTIN 300 MG PO CAPS: ORAL_CAPSULE | ORAL | Status: DC

## 2011-10-08 NOTE — Progress Notes (Signed)
  Subjective:    Patient ID: Spencer Duffy, male    DOB: 05-Jun-1965, 46 y.o.   MRN: [id number]  HPI  Patient seen with right foot pain. Started last week. 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 Location is dorsum of the foot. Radiates toward the great toe. Occasional burning and stinging pain. Occasional numbness right toe. No definite weakness. No history of gout. No fever or chills. Took Advil without relief. Had one leftover hydrocodone which helped somewhat. Denies back pain   Review of Systems  Constitutional: Negative for fever and chills.  Musculoskeletal: Negative for gait problem.       Objective:   Physical Exam  Constitutional: He appears well-developed and well-nourished.  Cardiovascular: Normal rate and regular rhythm.   Pulmonary/Chest: Effort normal and breath sounds normal. No respiratory distress. He has no wheezes. He has no rales.  Musculoskeletal:       Right foot reveals no obvious swelling. No ecchymosis. No warmth. No erythema. Good distal foot pulses. He has pain and tenderness palpating the mid section of the foot. Full range of motion right ankle. Achilles tendon intact. Full-strength with plantar flexion dorsiflexion.  Neurological:       Symmetric knee and ankle reflex bilaterally          Assessment & Plan:  Right foot pain. Question neuropathic pain. Trial gabapentin 300 mg at night for 3 nights then titrate to 300 mg twice daily.  limited Percocet 7.5 mg 1 every 6 hours for severe pain as a supplement. Touch base next week if not improving

## 2011-11-11 ENCOUNTER — Other Ambulatory Visit: Payer: Self-pay | Admitting: Orthopedic Surgery

## 2011-11-11 DIAGNOSIS — M25519 Pain in unspecified shoulder: Secondary | ICD-10-CM

## 2011-11-12 ENCOUNTER — Encounter: Payer: Self-pay | Admitting: Family Medicine

## 2011-11-14 ENCOUNTER — Ambulatory Visit
Admission: RE | Admit: 2011-11-14 | Discharge: 2011-11-14 | Disposition: A | Discharge status: Home / Self Care | Payer: 59 | Source: Ambulatory Visit | Attending: Orthopedic Surgery | Admitting: Orthopedic Surgery

## 2011-11-14 DIAGNOSIS — M25519 Pain in unspecified shoulder: Secondary | ICD-10-CM

## 2011-12-17 ENCOUNTER — Telehealth: Payer: Self-pay

## 2011-12-17 MED ORDER — ZOLPIDEM TARTRATE 10 MG PO TABS: 10.0000 mg | ORAL_TABLET | ORAL | Status: DC | PRN

## 2011-12-17 NOTE — Telephone Encounter (Signed)
OK to refill

## 2011-12-17 NOTE — Telephone Encounter (Signed)
Rx request for zolpidem tartrate 10 mg- Take 1 tablet as directed.  Pt last seen 10/08/11. Pls advise.

## 2011-12-17 NOTE — Addendum Note (Signed)
Addended by: Beverely Low on: 12/17/2011 05:22 PM   Modules accepted: Orders

## 2012-02-04 ENCOUNTER — Other Ambulatory Visit: Payer: Self-pay | Admitting: Family Medicine

## 2012-02-07 NOTE — Telephone Encounter (Signed)
Ambien last filled 12-17-11, #30 with 0 refills

## 2012-02-08 NOTE — Telephone Encounter (Signed)
Refill OK

## 2012-03-29 ENCOUNTER — Ambulatory Visit (INDEPENDENT_AMBULATORY_CARE_PROVIDER_SITE_OTHER): Payer: 59 | Admitting: Family Medicine

## 2012-03-29 ENCOUNTER — Encounter: Payer: Self-pay | Admitting: Family Medicine

## 2012-03-29 VITALS — BP 120/64 | Temp 97.9°F | Wt 198.0 lb

## 2012-03-29 DIAGNOSIS — E119 Type 2 diabetes mellitus without complications: Secondary | ICD-10-CM

## 2012-03-29 DIAGNOSIS — Z7189 Other specified counseling: Secondary | ICD-10-CM

## 2012-03-29 DIAGNOSIS — G4700 Insomnia, unspecified: Secondary | ICD-10-CM

## 2012-03-29 DIAGNOSIS — Z7184 Encounter for health counseling related to travel: Secondary | ICD-10-CM

## 2012-03-29 LAB — MICROALBUMIN / CREATININE URINE RATIO
Creatinine,U: 91.8 mg/dL
Microalb Creat Ratio: 0.4 mg/g (ref 0.0–30.0)

## 2012-03-29 LAB — HEMOGLOBIN A1C: Hgb A1c MFr Bld: 6.9 % — ABNORMAL HIGH (ref 4.6–6.5)

## 2012-03-29 MED ORDER — ATOVAQUONE-PROGUANIL HCL 250-100 MG PO TABS: ORAL_TABLET | ORAL | Status: DC

## 2012-03-29 MED ORDER — TYPHOID VACCINE PO CPDR: 1.0000 | DELAYED_RELEASE_CAPSULE | ORAL | Status: DC

## 2012-03-29 MED ORDER — ONDANSETRON 8 MG PO TBDP: 8.0000 mg | ORAL_TABLET | Freq: Three times a day (TID) | ORAL | Status: DC | PRN

## 2012-03-29 MED ORDER — ESZOPICLONE 3 MG PO TABS: 3.0000 mg | ORAL_TABLET | Freq: Every day | ORAL | Status: DC

## 2012-03-29 MED ORDER — CIPROFLOXACIN HCL 500 MG PO TABS: 500.0000 mg | ORAL_TABLET | Freq: Two times a day (BID) | ORAL | Status: DC

## 2012-03-29 NOTE — Progress Notes (Signed)
  Subjective:    Patient ID: Spencer Duffy, male    DOB: 05-03-1965, 46 y.o.   MRN: [id number]  HPI  Type 2 diabetes. History of excellent control. Very little exercise recently. Remains on Actos met. No symptoms of hyperglycemia. Gets regular eye exams. No recent foot problems.  Upcoming travel to [place] for work.  Patient's tetanus up-to-date. Hepatitis A up-to-date. Needs malaria and typhoid fever vaccination and prevention.  Had typhoid injection this was over 2 years ago.  Chronic insomnia. Recently increased difficulties with postoperative left shoulder pain. Has used Ambien but only getting 2-3 hours sleep. Requesting alternative, especially with upcoming travel. Also sleep with Ambien but very short duration. No regular alcohol use. No excessive caffeine use.  Past Medical History  Diagnosis Date  . Arthritis   . Diabetes mellitus     TYPE II  . GERD (gastroesophageal reflux disease)   . Hay fever     ALLERGIES  . Heart murmur    Past Surgical History  Procedure Date  . Bilateral knee arthroscopy     X2  . Skin cancer excision     ON CHEST    reports that he has never smoked. He does not have any smokeless tobacco history on file. He reports that he does not drink alcohol. His drug history not on file. family history includes Arthritis in his other; Diabetes in his other; and Hypertension in his other. No Known Allergies    Review of Systems  Constitutional: Negative for fatigue.  Eyes: Negative for visual disturbance.  Respiratory: Negative for cough, chest tightness and shortness of breath.   Cardiovascular: Negative for chest pain, palpitations and leg swelling.  Neurological: Negative for dizziness, syncope, weakness, light-headedness and headaches.       Objective:   Physical Exam  Constitutional: He appears well-developed and well-nourished.  Neck: Neck supple. No thyromegaly present.  Cardiovascular: Normal rate and regular rhythm.   Pulmonary/Chest: Effort  normal and breath sounds normal. No respiratory distress. He has no wheezes. He has no rales.  Musculoskeletal: He exhibits no edema.       Feet reveal no skin lesions. Good distal foot pulses. Good capillary refill. No calluses. Normal sensation with monofilament testing           Assessment & Plan:  #1 type 2 diabetes. Excellent control history. Recheck A1c. Continue regular eye exams. #2 travel medicine consultation. Tetanus up-to-date. Malarone for malaria prevention with instructions given. Vivotif which he will start now and he knows not to overlap with Malarone therapy. #3 chronic insomnia. Try Lunesta 3 mg each bedtime as needed

## 2012-03-31 NOTE — Progress Notes (Signed)
Quick Note:  Pt informed ______ 

## 2012-04-21 ENCOUNTER — Other Ambulatory Visit: Payer: Self-pay | Admitting: Family Medicine

## 2012-04-24 ENCOUNTER — Telehealth: Payer: Self-pay | Admitting: Family Medicine

## 2012-04-24 NOTE — Telephone Encounter (Signed)
Patient Information:  Caller Name: Robinson  Phone: [phone]  Patient: Spencer Duffy, Spencer Duffy  Gender: Male  DOB: 01-09-1966  Age: 47 Years  PCP: Evelena Peat [facility])  Office Follow Up:  Does the office need to follow up with this patient?: No  Instructions For The Office: N/A  RN Note:  Advised saline spray for congestion.  Symptoms  Reason For Call & Symptoms: Has nasal congestion, pressure, mild cough since 12-26. Afebrile.  Reviewed Health History In EMR: Yes  Reviewed Medications In EMR: Yes  Reviewed Allergies In EMR: Yes  Reviewed Surgeries / Procedures: Yes  Date of Onset of Symptoms: 04/13/2012  Treatments Tried: Alka Seltzer Plus  Treatments Tried Worked: No  Guideline(s) Used:  Colds  Disposition Per Guideline:   See Today or Tomorrow in Office  Reason For Disposition Reached:   Sinus congestion (pressure, fullness) present > 10 days  Advice Given:  N/A  Appointment Scheduled:  04/25/2012 08:30:00 Appointment Scheduled Provider:  Evelena Peat (Family Practice)

## 2012-04-25 ENCOUNTER — Encounter: Payer: Self-pay | Admitting: Family Medicine

## 2012-04-25 ENCOUNTER — Ambulatory Visit (INDEPENDENT_AMBULATORY_CARE_PROVIDER_SITE_OTHER): Payer: 59 | Admitting: Family Medicine

## 2012-04-25 VITALS — BP 110/78 | Temp 97.8°F | Wt 198.0 lb

## 2012-04-25 DIAGNOSIS — J019 Acute sinusitis, unspecified: Secondary | ICD-10-CM

## 2012-04-25 MED ORDER — AMOXICILLIN 875 MG PO TABS: 875.0000 mg | ORAL_TABLET | Freq: Two times a day (BID) | ORAL | Status: DC

## 2012-04-25 NOTE — Patient Instructions (Addendum)

## 2012-04-25 NOTE — Progress Notes (Signed)
  Subjective:    Patient ID: Spencer Duffy, male    DOB: 02-Jul-1965, 47 y.o.   MRN: [id number]  HPI  Onset about 10-11 days ago typical cold-like symptoms. Since then he's had some progressive sinus pressure. His nasal discharge alternates between clear in colored. Increased fatigue. Frequent headaches. Sore throat off and on. Occasional dry cough. No fever. No nausea or vomiting. Has taken Alka-Seltzer without much relief. Nonsmoker. Upcoming travel to [place]  Review of Systems As per history of present illness    Objective:   Physical Exam  Constitutional: He appears well-developed and well-nourished.  HENT:  Right Ear: External ear normal.  Left Ear: External ear normal.  Nose: Nose normal.  Mouth/Throat: Oropharynx is clear and moist.  Neck: Neck supple. No thyromegaly present.  Cardiovascular: Normal rate and regular rhythm.   Pulmonary/Chest: Effort normal and breath sounds normal. No respiratory distress. He has no wheezes. He has no rales.  Lymphadenopathy:    He has no cervical adenopathy.          Assessment & Plan:  Acute sinusitis. Given duration of symptoms start amoxicillin 875 mg twice daily for 10 days.

## 2012-05-20 ENCOUNTER — Other Ambulatory Visit: Payer: Self-pay | Admitting: Family Medicine

## 2012-08-07 ENCOUNTER — Other Ambulatory Visit (INDEPENDENT_AMBULATORY_CARE_PROVIDER_SITE_OTHER): Payer: 59

## 2012-08-07 DIAGNOSIS — Z0000 Encounter for general adult medical examination without abnormal findings: Secondary | ICD-10-CM

## 2012-08-07 LAB — MICROALBUMIN / CREATININE URINE RATIO: Microalb Creat Ratio: 0.9 mg/g (ref 0.0–30.0)

## 2012-08-07 LAB — BASIC METABOLIC PANEL
BUN: 15 mg/dL (ref 6–23)
CO2: 29 mEq/L (ref 19–32)
Calcium: 9 mg/dL (ref 8.4–10.5)
Chloride: 100 mEq/L (ref 96–112)
Creatinine, Ser: 0.9 mg/dL (ref 0.4–1.5)

## 2012-08-07 LAB — CBC WITH DIFFERENTIAL/PLATELET
Basophils Absolute: 0 10*3/uL (ref 0.0–0.1)
Eosinophils Absolute: 0 10*3/uL (ref 0.0–0.7)
Lymphocytes Relative: 30.4 % (ref 12.0–46.0)
MCHC: 33.5 g/dL (ref 30.0–36.0)
MCV: 91.4 fl (ref 78.0–100.0)
Monocytes Absolute: 0.4 10*3/uL (ref 0.1–1.0)
Neutrophils Relative %: 60.6 % (ref 43.0–77.0)
Platelets: 278 10*3/uL (ref 150.0–400.0)
RBC: 4.52 Mil/uL (ref 4.22–5.81)
RDW: 12.8 % (ref 11.5–14.6)

## 2012-08-07 LAB — POCT URINALYSIS DIPSTICK
Bilirubin, UA: NEGATIVE
Blood, UA: NEGATIVE
Glucose, UA: NEGATIVE
Leukocytes, UA: NEGATIVE
Nitrite, UA: NEGATIVE
Urobilinogen, UA: 0.2
pH, UA: 6.5

## 2012-08-07 LAB — LIPID PANEL
Cholesterol: 163 mg/dL (ref 0–200)
HDL: 41 mg/dL (ref >39.00)
LDL Cholesterol: 111 mg/dL — ABNORMAL HIGH (ref 0–99)
Triglycerides: 55 mg/dL (ref 0.0–149.0)
VLDL: 11 mg/dL (ref 0.0–40.0)

## 2012-08-07 LAB — HEPATIC FUNCTION PANEL
Alkaline Phosphatase: 71 U/L (ref 39–117)
Bilirubin, Direct: 0 mg/dL (ref 0.0–0.3)
Total Bilirubin: 0.8 mg/dL (ref 0.3–1.2)
Total Protein: 7.2 g/dL (ref 6.0–8.3)

## 2012-08-07 LAB — TSH: TSH: 0.44 u[IU]/mL (ref 0.35–5.50)

## 2012-08-09 ENCOUNTER — Encounter: Payer: 59 | Admitting: Family Medicine

## 2012-08-10 ENCOUNTER — Encounter: Payer: Self-pay | Admitting: Family Medicine

## 2012-08-10 ENCOUNTER — Ambulatory Visit (INDEPENDENT_AMBULATORY_CARE_PROVIDER_SITE_OTHER): Payer: 59 | Admitting: Family Medicine

## 2012-08-10 VITALS — BP 100/60 | HR 72 | Temp 98.4°F | Resp 12 | Ht 71.25 in | Wt 197.0 lb

## 2012-08-10 DIAGNOSIS — Z23 Encounter for immunization: Secondary | ICD-10-CM

## 2012-08-10 DIAGNOSIS — Z0000 Encounter for general adult medical examination without abnormal findings: Secondary | ICD-10-CM

## 2012-08-10 DIAGNOSIS — E119 Type 2 diabetes mellitus without complications: Secondary | ICD-10-CM

## 2012-08-10 NOTE — Progress Notes (Signed)
  Subjective:    Patient ID: Spencer Duffy, male    DOB: 08-19-1965, 47 y.o.   MRN: [id number]  HPI Patient here for complete physical History of type 2 diabetes which is currently controlled with Actos plus metformin No history of any complications. A1c has been well controlled. No history of Pneumovax. Gets regular eye care. Last tetanus 2014. Remains on Altace 5 mg daily. Has started back recently more consistent exercise.  Past Medical History  Diagnosis Date  . Arthritis   . Diabetes mellitus     TYPE II  . GERD (gastroesophageal reflux disease)   . Hay fever     ALLERGIES  . Heart murmur    Past Surgical History  Procedure Laterality Date  . Bilateral knee arthroscopy      X2  . Skin cancer excision      ON CHEST    reports that he has never smoked. He does not have any smokeless tobacco history on file. He reports that he does not drink alcohol. His drug history is not on file. family history includes Arthritis in his other; Diabetes in his other; and Hypertension in his other. No Known Allergies    Review of Systems  Constitutional: Negative for fever, activity change, appetite change, fatigue and unexpected weight change.  HENT: Negative for ear pain, congestion and trouble swallowing.   Eyes: Negative for pain and visual disturbance.  Respiratory: Negative for cough, shortness of breath and wheezing.   Cardiovascular: Negative for chest pain and palpitations.  Gastrointestinal: Negative for nausea, vomiting, abdominal pain, diarrhea, constipation, blood in stool, abdominal distention and rectal pain.  Genitourinary: Negative for dysuria, hematuria and testicular pain.  Musculoskeletal: Negative for joint swelling and arthralgias.  Skin: Negative for rash.  Neurological: Negative for dizziness, syncope and headaches.  Hematological: Negative for adenopathy.  Psychiatric/Behavioral: Negative for confusion and dysphoric mood.       Objective:   Physical Exam   Constitutional: He is oriented to person, place, and time. He appears well-developed and well-nourished. No distress.  HENT:  Head: Normocephalic and atraumatic.  Right Ear: External ear normal.  Left Ear: External ear normal.  Mouth/Throat: Oropharynx is clear and moist.  Eyes: Conjunctivae and EOM are normal. Pupils are equal, round, and reactive to light.  Neck: Normal range of motion. Neck supple. No thyromegaly present.  Cardiovascular: Normal rate, regular rhythm and normal heart sounds.   No murmur heard. Pulmonary/Chest: No respiratory distress. He has no wheezes. He has no rales.  Abdominal: Soft. Bowel sounds are normal. He exhibits no distension and no mass. There is no tenderness. There is no rebound and no guarding.  Musculoskeletal: He exhibits no edema.  Lymphadenopathy:    He has no cervical adenopathy.  Neurological: He is alert and oriented to person, place, and time. He displays normal reflexes. No cranial nerve deficit.  Skin: No rash noted.  Right upper back region reveals somewhat darkly pigmented freckled lesion which is about 6 mm diameter. Slightly irregular border. Some color variegation.  Psychiatric: He has a normal mood and affect.          Assessment & Plan:  #1 health maintenance. Pneumovax given since he is a diabetic. Tetanus up-to-date. Labs reviewed with patient. A1c remains well controlled. We discussed potential alternatives to Actos but at this point he wishes to continue with current regimen. #2 somewhat darkly pigmented lesion/freckled right upper back. Patient will schedule for excision

## 2012-08-24 ENCOUNTER — Encounter: Payer: Self-pay | Admitting: Family Medicine

## 2012-08-24 ENCOUNTER — Ambulatory Visit (INDEPENDENT_AMBULATORY_CARE_PROVIDER_SITE_OTHER): Payer: 59 | Admitting: Family Medicine

## 2012-08-24 VITALS — BP 110/72 | Temp 97.9°F

## 2012-08-24 DIAGNOSIS — L989 Disorder of the skin and subcutaneous tissue, unspecified: Secondary | ICD-10-CM

## 2012-08-24 NOTE — Progress Notes (Signed)
  Subjective:    Patient ID: Spencer Duffy, male    DOB: 1965/11/24, 47 y.o.   MRN: [id number]  HPI Pigmented lesion right upper back Noted on physical recently No symptoms such as itching or bleeding. We noted some slightly atypical features with darker pigment, inhomogenous pigmentation, and slightly irregular border  No history of skin cancer  Past Medical History  Diagnosis Date  . Arthritis   . Diabetes mellitus     TYPE II  . GERD (gastroesophageal reflux disease)   . Hay fever     ALLERGIES  . Heart murmur    Past Surgical History  Procedure Laterality Date  . Bilateral knee arthroscopy      X2  . Skin cancer excision      ON CHEST    reports that he has never smoked. He does not have any smokeless tobacco history on file. He reports that he does not drink alcohol. His drug history is not on file. family history includes Arthritis in his other; Diabetes in his other; and Hypertension in his other. No Known Allergies    Review of Systems  Constitutional: Negative for appetite change and unexpected weight change.  Hematological: Negative for adenopathy.       Objective:   Physical Exam  Constitutional: He appears well-developed and well-nourished.  Cardiovascular: Normal rate and regular rhythm.   Skin:  Right upper back reveals pigmented lesion with area about 5 x 6 cm. Mild color variegation. Slightly irregular border. Not elevated.          Assessment & Plan:  Slightly atypical pigmented lesion right upper back. Suspect this just represents slightly atypical freckle. With skin type we recommended biopsy.  Discussed risks and benefits of shave excision including risks of bleeding, bruising, scar formation, and infection and patient consented to proceed.  Skin prepped with betadine and alcohol and shave excision of lesion with #15 blade with minimal bleeding.  Patient tolerated well.  Antibiotic and dressing  applied.

## 2012-08-24 NOTE — Patient Instructions (Addendum)
Keep wound dry for the first 24 hours then clean daily with soap and water for one week. Apply topical antibiotic daily for 3-4 days. Keep covered with clean dressing for 4-5 days. Follow up promptly for any signs of infection such as redness, warmth, pain, or drainage.  

## 2012-08-29 NOTE — Progress Notes (Signed)
Quick Note:  Pt informed ______ 

## 2012-09-27 ENCOUNTER — Ambulatory Visit: Payer: 59 | Admitting: Family Medicine

## 2012-12-04 ENCOUNTER — Telehealth: Payer: Self-pay | Admitting: Family Medicine

## 2012-12-04 MED ORDER — METFORMIN HCL 500 MG PO TABS: ORAL_TABLET | ORAL | Status: DC

## 2012-12-04 NOTE — Telephone Encounter (Signed)
PT is calling to request a years supply of metFORMIN (GLUCOPHAGE) 500 MG tablet, [facility] Please assist.

## 2012-12-04 NOTE — Telephone Encounter (Signed)
rx sent to pharmacy

## 2012-12-11 LAB — HM DIABETES EYE EXAM

## 2012-12-12 ENCOUNTER — Encounter: Payer: Self-pay | Admitting: Family Medicine

## 2012-12-14 ENCOUNTER — Telehealth: Payer: Self-pay | Admitting: Family Medicine

## 2012-12-14 ENCOUNTER — Ambulatory Visit (INDEPENDENT_AMBULATORY_CARE_PROVIDER_SITE_OTHER): Payer: 59 | Admitting: Internal Medicine

## 2012-12-14 ENCOUNTER — Encounter: Payer: Self-pay | Admitting: Internal Medicine

## 2012-12-14 VITALS — BP 102/76 | Temp 98.3°F | Wt 194.0 lb

## 2012-12-14 DIAGNOSIS — R52 Pain, unspecified: Secondary | ICD-10-CM

## 2012-12-14 DIAGNOSIS — R1031 Right lower quadrant pain: Secondary | ICD-10-CM | POA: Insufficient documentation

## 2012-12-14 LAB — POCT URINALYSIS DIPSTICK
Bilirubin, UA: NEGATIVE
Blood, UA: NEGATIVE
Protein, UA: NEGATIVE
pH, UA: 5.5

## 2012-12-14 LAB — BASIC METABOLIC PANEL
CO2: 27 mEq/L (ref 19–32)
Chloride: 100 mEq/L (ref 96–112)
Creat: 1.1 mg/dL (ref 0.50–1.35)
Glucose, Bld: 115 mg/dL — ABNORMAL HIGH (ref 70–99)

## 2012-12-14 LAB — CBC WITH DIFFERENTIAL/PLATELET
Basophils Absolute: 0 10*3/uL (ref 0.0–0.1)
Basophils Relative: 0 % (ref 0–1)
Eosinophils Absolute: 0 10*3/uL (ref 0.0–0.7)
Hemoglobin: 14.1 g/dL (ref 13.0–17.0)
MCH: 30.4 pg (ref 26.0–34.0)
MCHC: 34.6 g/dL (ref 30.0–36.0)
Monocytes Absolute: 0.3 10*3/uL (ref 0.1–1.0)
Monocytes Relative: 3 % (ref 3–12)
Neutrophils Relative %: 76 % (ref 43–77)
RDW: 13.2 % (ref 11.5–15.5)

## 2012-12-14 MED ORDER — TRAMADOL HCL 50 MG PO TABS: 50.0000 mg | ORAL_TABLET | Freq: Three times a day (TID) | ORAL | Status: DC | PRN

## 2012-12-14 NOTE — Progress Notes (Signed)
Subjective:    Patient ID: Spencer Duffy, male    DOB: September 21, 1965, 47 y.o.   MRN: [id number]  HPI  47 year old white male with history of type 2 diabetes and hypertension complains of acute onset right lower quadrant pain. His symptoms started around 6 PM. He noticed discomfort after cutting his grass. He wasn't sure whether symptoms triggered by sitting on his lawnmower. When he woke up this morning patient noticed discomfort in the right lower inguinal/right lower quadrant area. It seemed to get slightly worse as day progressed. Around noon patient went for a run. Jogging didn't seem to bother his right lower quadrant pain. As he was walking back, his pain got worse. He also noticed worsening of pain while he was showering. He sat in a meeting during the early afternoon and when he got up after prolonged sitting, he experienced transient sharp pain. When pain is severe,he rates it as 7/10.   There has been no associated fever, chills. No nausea vomiting, constipation or diarrhea.  Review of Systems Negative for urinary complaints, no flank pain.  Slight radiation of pain to right groin while he was taking a shower.  Past Medical History  Diagnosis Date  . Arthritis   . Diabetes mellitus     TYPE II  . GERD (gastroesophageal reflux disease)   . Hay fever     ALLERGIES  . Heart murmur     History   Social History  . Marital Status: Married    Spouse Name: N/A    Number of Children: N/A  . Years of Education: N/A   Occupational History  . Not on file.   Social History Main Topics  . Smoking status: Never Smoker   . Smokeless tobacco: Not on file  . Alcohol Use: No  . Drug Use:   . Sexual Activity:    Other Topics Concern  . Not on file   Social History Narrative  . No narrative on file    Past Surgical History  Procedure Laterality Date  . Bilateral knee arthroscopy      X2  . Skin cancer excision      ON CHEST    Family History  Problem Relation Age of Onset  .  Arthritis Other   . Hypertension Other   . Diabetes Other     PARENT, GRANDPARENT    No Known Allergies  Current Outpatient Prescriptions on File Prior to Visit  Medication Sig Dispense Refill  . ACTOPLUS MET XR 15-1000 MG TB24 TAKE 1 TABLET DAILY  90 tablet  2  . gabapentin (NEURONTIN) 300 MG capsule Take 300 mg by mouth at bedtime.       Marland Kitchen loratadine (CLARITIN) 10 MG tablet Take 10 mg by mouth daily.        . metFORMIN (GLUCOPHAGE) 500 MG tablet TAKE 1 TABLET ONCE DAILY  90 tablet  3  . ramipril (ALTACE) 5 MG capsule TAKE 1 CAPSULE DAILY  90 capsule  3   No current facility-administered medications on file prior to visit.    BP 102/76  Temp(Src) 98.3 F (36.8 C) (Oral)  Wt 194 lb (87.998 kg)  BMI 26.86 kg/m2       Objective:   Physical Exam  Constitutional: He is oriented to person, place, and time. He appears well-developed and well-nourished.  HENT:  Head: Normocephalic and atraumatic.  Mouth/Throat: Oropharynx is clear and moist.  Eyes: Conjunctivae are normal. Pupils are equal, round, and reactive to light.  Neck: Neck  supple.  Cardiovascular: Normal rate, regular rhythm and normal heart sounds.   No murmur heard. Pulmonary/Chest: Effort normal and breath sounds normal. He has no wheezes.  Abdominal: Soft. Bowel sounds are normal. There is no rebound and no guarding.  Right lower quadrant and right inguinal tenderness.  No obvious hernia.  No indirect hernia.  Normal testicular exam.  No pain with percussion of right and left flank  Musculoskeletal: He exhibits no edema.  Lymphadenopathy:    He has no cervical adenopathy.  Neurological: He is alert and oriented to person, place, and time. No cranial nerve deficit.  Skin: Skin is warm and dry. No rash noted. No erythema.  Psychiatric: He has a normal mood and affect. His behavior is normal.          Assessment & Plan:

## 2012-12-14 NOTE — Patient Instructions (Signed)
Our office will contact you re: blood test results Contact our office if your right lower quadrant pain gets worse and / or you develop fever.

## 2012-12-14 NOTE — Assessment & Plan Note (Signed)
47 year old white male with type 2 diabetes complains of acute onset right lower quadrant pain. No obvious hernia on exam. Unclear whether symptoms related to muscular strain versus early onset appendicitis. Obtain CBC with differential and sedimentation rate. If patient has leukocytosis and elevated sed rate, we discussed obtaining CT of abdomen and pelvis with IV contrast. He understands he will need to hold metformin before and after imaging study. Use tramadol 50 mg tid as needed.

## 2012-12-14 NOTE — Telephone Encounter (Signed)
Patient Information:  Caller Name: Levonte  Phone: [phone]  Patient: Spencer Duffy, Spencer Duffy  Gender: Male  DOB: 28-Oct-1965  Age: 47 Years  PCP: Evelena Peat [facility])  Office Follow Up:  Does the office need to follow up with this patient?: No  Instructions For The Office: N/A  RN Note:  Pt has RLQ pain what worsen w/ sitting and walking, running actually takes pain away. Pt denies vomiting or fever. Pt does not wish to be seen at ED.  Next available appt scheduled w/ Dr Artist Pais on 8-28 at 1600, due to Pt's transit time, Dr Caryl Never has no availablity.  Pt verbalized understanding.  Symptoms  Reason For Call & Symptoms: RLQ Pain  Reviewed Health History In EMR: Yes  Reviewed Medications In EMR: Yes  Reviewed Allergies In EMR: Yes  Reviewed Surgeries / Procedures: Yes  Date of Onset of Symptoms: 12/13/2012  Guideline(s) Used:  Abdominal Pain - Male  Disposition Per Guideline:   Go to ED Now (or to Office with PCP Approval)  Reason For Disposition Reached:   Constant abdominal pain lasting > 2 hours  Advice Given:  N/A  Patient Will Follow Care Advice:  YES  Appointment Scheduled:  12/14/2012 16:00:00 Appointment Scheduled Provider:  Artist Pais, Doe-Hyun Molly Maduro) (Adults only)

## 2012-12-15 ENCOUNTER — Ambulatory Visit: Payer: 59 | Admitting: Family Medicine

## 2012-12-20 ENCOUNTER — Encounter: Payer: Self-pay | Admitting: Family Medicine

## 2012-12-20 ENCOUNTER — Ambulatory Visit (INDEPENDENT_AMBULATORY_CARE_PROVIDER_SITE_OTHER): Payer: 59 | Admitting: Family Medicine

## 2012-12-20 VITALS — BP 102/76 | HR 60 | Temp 98.0°F | Wt 196.0 lb

## 2012-12-20 DIAGNOSIS — R1031 Right lower quadrant pain: Secondary | ICD-10-CM

## 2012-12-20 MED ORDER — GABAPENTIN 300 MG PO CAPS: 300.0000 mg | ORAL_CAPSULE | Freq: Two times a day (BID) | ORAL | Status: DC

## 2012-12-20 NOTE — Patient Instructions (Signed)
Follow up promptly for any fever or increased abdominal pain.

## 2012-12-20 NOTE — Progress Notes (Signed)
  Subjective:    Patient ID: Spencer Duffy, male    DOB: Dec 15, 1965, 47 y.o.   MRN: [id number]  HPI Followup regarding recent right lower quadrant abdominal pain. Recent office note reviewed. Patient developed right lower quadrant pain after cutting some grass. Pain was sharp and 7/10 in intensity and varied in intensity. For example, he had no pain with jogging but noted pain afterwards in the shower. Lab work here was unremarkable with normal white count and sedimentation rate of 4. His pain is improving at this time and is very minimal. Pain remains right lower caution without radiation. Worse with changing positions such as sitting up. He has never had any associated fever, stool changes, or any appetite changes No dysuria.  He only took two tramadol and this may have helped his pain slightly  Past Medical History  Diagnosis Date  . Arthritis   . Diabetes mellitus     TYPE II  . GERD (gastroesophageal reflux disease)   . Hay fever     ALLERGIES  . Heart murmur    Past Surgical History  Procedure Laterality Date  . Bilateral knee arthroscopy      X2  . Skin cancer excision      ON CHEST    reports that he has never smoked. He does not have any smokeless tobacco history on file. He reports that he does not drink alcohol. His drug history is not on file. family history includes Arthritis in his other; Diabetes in his other; Hypertension in his other. No Known Allergies    Review of Systems  Constitutional: Negative for fever and chills.  Gastrointestinal: Positive for abdominal pain. Negative for nausea, vomiting, diarrhea, constipation, blood in stool and abdominal distention.  Genitourinary: Negative for dysuria.  Skin: Negative for rash.       Objective:   Physical Exam  Constitutional: He appears well-developed and well-nourished.  Cardiovascular: Normal rate and regular rhythm.   Pulmonary/Chest: Effort normal and breath sounds normal. No respiratory distress. He  has no wheezes. He has no rales.  Abdominal: Soft. Bowel sounds are normal. He exhibits no distension and no mass. There is tenderness. There is no rebound and no guarding.  Patient has some mild tenderness right lower quadrant to deep palpation. No guarding or rebound. No mass.          Assessment & Plan:  Right lower quadrant abdominal pain. He has not any fever, elevated white count, change of appetite or other worrisome features for appendicitis. His pain is gradually improving and we suspect muscular strain. Observe for now. Followup promptly for any fever or worsening pain

## 2013-02-22 ENCOUNTER — Other Ambulatory Visit: Payer: Self-pay

## 2013-05-01 ENCOUNTER — Telehealth: Payer: Self-pay | Admitting: Family Medicine

## 2013-05-01 NOTE — Telephone Encounter (Signed)
Pt saw dr Elease Hashimoto for foot pain 3-4 mos ago. Advised pt if not better may need to see podiatrist. Knot on top of foot now too and not better. Pt wants to know if he should come back to see dr Elease Hashimoto, to get referral to foot dr/  pls advise

## 2013-05-02 ENCOUNTER — Other Ambulatory Visit: Payer: Self-pay | Admitting: Family Medicine

## 2013-05-02 DIAGNOSIS — M79673 Pain in unspecified foot: Secondary | ICD-10-CM

## 2013-05-02 NOTE — Telephone Encounter (Signed)
Order is placed.

## 2013-05-02 NOTE — Telephone Encounter (Signed)
I recommend he see Dr Caffie Pinto [facility]) unless he has another preference.

## 2013-05-09 ENCOUNTER — Other Ambulatory Visit: Payer: Self-pay | Admitting: Family Medicine

## 2013-05-11 ENCOUNTER — Ambulatory Visit (INDEPENDENT_AMBULATORY_CARE_PROVIDER_SITE_OTHER): Payer: 59 | Admitting: Podiatry

## 2013-05-11 ENCOUNTER — Encounter: Payer: Self-pay | Admitting: Podiatry

## 2013-05-11 VITALS — BP 118/74 | HR 65 | Ht 72.0 in | Wt 186.0 lb

## 2013-05-11 DIAGNOSIS — M79609 Pain in unspecified limb: Secondary | ICD-10-CM

## 2013-05-11 DIAGNOSIS — M898X9 Other specified disorders of bone, unspecified site: Secondary | ICD-10-CM

## 2013-05-11 DIAGNOSIS — M7751 Other enthesopathy of right foot: Secondary | ICD-10-CM | POA: Insufficient documentation

## 2013-05-11 DIAGNOSIS — M79671 Pain in right foot: Secondary | ICD-10-CM | POA: Insufficient documentation

## 2013-05-11 DIAGNOSIS — M792 Neuralgia and neuritis, unspecified: Secondary | ICD-10-CM | POA: Insufficient documentation

## 2013-05-11 DIAGNOSIS — G5790 Unspecified mononeuropathy of unspecified lower limb: Secondary | ICD-10-CM

## 2013-05-11 MED ORDER — ARCH BANDAGE MISC: 1.0000 | Freq: Once | Status: DC

## 2013-05-11 NOTE — Patient Instructions (Signed)
Seen for pain in right foot. Noted of palpable mass over the base of 2nd Metatarsal bone.  Injection given. Keep Metatarsal binder during the day.  Return in 3 weeks.

## 2013-05-11 NOTE — Progress Notes (Signed)
Subjective: 48 year old male presents complaining of pain on top of right foot x 8 months. He reveals an incident that he injured right foot that left with black and blue in big toe joint.   Review of Systems - General ROS: negative for - chills, fatigue, malaise, night sweats, sleep disturbance, weight gain or weight loss Ophthalmic ROS: negative ENT ROS: negative Respiratory ROS: no cough, shortness of breath, or wheezing Cardiovascular ROS: no chest pain or dyspnea on exertion Gastrointestinal ROS: no abdominal pain, change in bowel habits, or black or bloody stools Genito-Urinary ROS: no dysuria, trouble voiding, or hematuria Musculoskeletal ROS: Right knee swollen 2 weeks ago, now getting better.  Neurological ROS: no TIA or stroke symptoms Dermatological ROS: negative.  Objective: Dermatologic: Palpable mass, firm and raised about at the base of the 2nd metatarsal.  No open skin lesions.  Neurologic: Abnormal shooting pain upon light pressure over the dorsum at about the 2nd MCJ area.  Orthopedic: High arched cavus type foot. Palpable firm bony mass over the 2nd MCJ area dorsum. Radiographic examination of the right foot reveal circular lesion marker indicated over the first Lisfranc joint, medial to the 2nd MCJ and lateral to the first MCJ in AP vew.  There is irregular bony surface over the 2nd MCJ area under the lesion marker in lateral view. High arched foot with no other gross osseous deformities noted.  Assessment: 1. Tenosynovitis at the base of 2nd MCJ right foot.  2. Dorsomedial cutaneous nerve Neuralgia secondary to compression with shoe gear over the mass.   Plan: Reviewed clinical findings and available treatment options. Palpable mass injected with 4 mg of Dexamethasone and 4mg  of Triamcinolone mixed with 1 ml of Xylocaine given. Compression bandage applied to the midfoot.  Return in 3 weeks.

## 2013-06-01 ENCOUNTER — Ambulatory Visit (INDEPENDENT_AMBULATORY_CARE_PROVIDER_SITE_OTHER): Payer: 59 | Admitting: Podiatry

## 2013-06-01 ENCOUNTER — Encounter: Payer: Self-pay | Admitting: Podiatry

## 2013-06-01 VITALS — BP 119/71 | HR 70 | Ht 72.0 in | Wt 186.0 lb

## 2013-06-01 DIAGNOSIS — M7751 Other enthesopathy of right foot: Secondary | ICD-10-CM

## 2013-06-01 DIAGNOSIS — E119 Type 2 diabetes mellitus without complications: Secondary | ICD-10-CM | POA: Insufficient documentation

## 2013-06-01 DIAGNOSIS — G5790 Unspecified mononeuropathy of unspecified lower limb: Secondary | ICD-10-CM

## 2013-06-01 DIAGNOSIS — M792 Neuralgia and neuritis, unspecified: Secondary | ICD-10-CM

## 2013-06-01 DIAGNOSIS — M898X9 Other specified disorders of bone, unspecified site: Secondary | ICD-10-CM

## 2013-06-01 NOTE — Patient Instructions (Signed)
Seen for pain on right foot. Reviewed surgical resection of overgrown bony fibrous tissue that is underlying the sensitive nerve right foot.  Tentative surgery date is March 5th, 2015.  Call with any questions.

## 2013-06-01 NOTE — Progress Notes (Signed)
Subjective: 3 week follow up after injection and compression bandage for painful bump and nerve over top of right foot. Taking Gabapentin for the nerve pain. That helped with burning pain. He will not refill the medication.  Works in [employer] and sits at work.  Objective: No change in palpable raised bump at mid Lisfranc joint at about 2nd MCJ. Pain with direct light pressure. No associated edema or erythema. Mild bunion asymptomatic.   Assessment: Abnormal Fibro-osseous growth at about 2nd MCJ area right. Neuralgia pressure induced.  Plan: Injection did not help. Surgical option reviewed as per request. Resection of the dorsally protruding hard mass may alleviate symptom.  Consent form reviewed for resection of bony over growth right foot at mid lisfranc joint area.

## 2013-06-21 DIAGNOSIS — IMO0002 Reserved for concepts with insufficient information to code with codable children: Secondary | ICD-10-CM

## 2013-06-21 DIAGNOSIS — M898X9 Other specified disorders of bone, unspecified site: Secondary | ICD-10-CM

## 2013-06-21 HISTORY — PX: OTHER SURGICAL HISTORY: SHX169

## 2013-06-24 ENCOUNTER — Other Ambulatory Visit: Payer: Self-pay | Admitting: Family Medicine

## 2013-06-26 ENCOUNTER — Encounter: Payer: Self-pay | Admitting: Podiatry

## 2013-06-26 ENCOUNTER — Ambulatory Visit (INDEPENDENT_AMBULATORY_CARE_PROVIDER_SITE_OTHER): Payer: 59 | Admitting: Podiatry

## 2013-06-26 VITALS — BP 126/77 | HR 62

## 2013-06-26 DIAGNOSIS — G5790 Unspecified mononeuropathy of unspecified lower limb: Secondary | ICD-10-CM

## 2013-06-26 DIAGNOSIS — M792 Neuralgia and neuritis, unspecified: Secondary | ICD-10-CM

## 2013-06-26 MED ORDER — TAPENTADOL HCL 50 MG PO TABS: 50.0000 mg | ORAL_TABLET | ORAL | Status: DC | PRN

## 2013-06-26 NOTE — Patient Instructions (Signed)
Post op visit.  Noted of red skin in anterior aspect of the ankle joint, possible due to friction from bandage. Monitor the area for any change. Return in one week.

## 2013-06-26 NOTE — Progress Notes (Signed)
5 days post op following excision of tarsal bone and neurolysis left foot dorsum. Stated that by Sunday his foot was hurting. Bandage was removed. Red friction mark at anterior surface of right ankle. Incision area is clean and dry without redness or opening.   Assessment: Normal wound healing. Pain from friction at ankle from tight bandage.  Plan: Rx pain pill as per request. Discussed the findings. Patient is to monitor the are to D/Dx any sign of infection.  Patient will take new pain medication once the pain is determined to be from friction.

## 2013-07-03 ENCOUNTER — Ambulatory Visit (INDEPENDENT_AMBULATORY_CARE_PROVIDER_SITE_OTHER): Payer: 59 | Admitting: Podiatry

## 2013-07-03 ENCOUNTER — Encounter: Payer: Self-pay | Admitting: Podiatry

## 2013-07-03 VITALS — BP 130/74 | HR 70

## 2013-07-03 DIAGNOSIS — Z9889 Other specified postprocedural states: Secondary | ICD-10-CM | POA: Insufficient documentation

## 2013-07-03 NOTE — Progress Notes (Signed)
2 weeks follow up. Stated that he had blister that came off from red ankle area.  Incision area has lost steri strip. No edema or erythema noted.  Now back to normal progress.  Home care instruction with sample tape dispensed to keep the incision site protected while traveling away.  Return in 2 weeks.

## 2013-07-03 NOTE — Patient Instructions (Signed)
Seen for post op visit.  Should monitor and protect incision site while ambulating.  Return in 2 week.

## 2013-07-17 ENCOUNTER — Encounter: Payer: Self-pay | Admitting: Podiatry

## 2013-07-17 ENCOUNTER — Ambulatory Visit (INDEPENDENT_AMBULATORY_CARE_PROVIDER_SITE_OTHER): Payer: 59 | Admitting: Podiatry

## 2013-07-17 VITALS — BP 123/70 | HR 82

## 2013-07-17 DIAGNOSIS — M7751 Other enthesopathy of right foot: Secondary | ICD-10-CM

## 2013-07-17 DIAGNOSIS — M898X9 Other specified disorders of bone, unspecified site: Secondary | ICD-10-CM

## 2013-07-17 NOTE — Progress Notes (Signed)
4 weeks post op check up. Wound well coapted with minimum pain. Still some tenderness at site. Continue with compression bandage during ambulation. Do running after a few more weeks.  Return for any future issues.

## 2013-07-17 NOTE — Patient Instructions (Signed)
4 weeks post op. Doing well. Still need to protect the incision site with compression bandage.  Return as needed.

## 2013-08-08 ENCOUNTER — Other Ambulatory Visit (INDEPENDENT_AMBULATORY_CARE_PROVIDER_SITE_OTHER): Payer: 59

## 2013-08-08 DIAGNOSIS — Z0000 Encounter for general adult medical examination without abnormal findings: Secondary | ICD-10-CM

## 2013-08-08 LAB — POCT URINALYSIS DIPSTICK
Bilirubin, UA: NEGATIVE
Blood, UA: NEGATIVE
GLUCOSE UA: NEGATIVE
KETONES UA: NEGATIVE
LEUKOCYTES UA: NEGATIVE
Nitrite, UA: NEGATIVE
Protein, UA: NEGATIVE
Spec Grav, UA: 1.015
Urobilinogen, UA: 0.2
pH, UA: 6

## 2013-08-08 LAB — CBC WITH DIFFERENTIAL/PLATELET
BASOS PCT: 0.4 % (ref 0.0–3.0)
Basophils Absolute: 0 10*3/uL (ref 0.0–0.1)
EOS PCT: 0.8 % (ref 0.0–5.0)
Eosinophils Absolute: 0.1 10*3/uL (ref 0.0–0.7)
HEMATOCRIT: 40.1 % (ref 39.0–52.0)
Hemoglobin: 13.6 g/dL (ref 13.0–17.0)
LYMPHS ABS: 1.8 10*3/uL (ref 0.7–4.0)
Lymphocytes Relative: 22.8 % (ref 12.0–46.0)
MCHC: 33.9 g/dL (ref 30.0–36.0)
MCV: 91.4 fl (ref 78.0–100.0)
MONO ABS: 0.4 10*3/uL (ref 0.1–1.0)
Monocytes Relative: 5.3 % (ref 3.0–12.0)
NEUTROS ABS: 5.7 10*3/uL (ref 1.4–7.7)
Neutrophils Relative %: 70.7 % (ref 43.0–77.0)
Platelets: 277 10*3/uL (ref 150.0–400.0)
RBC: 4.39 Mil/uL (ref 4.22–5.81)
RDW: 13.1 % (ref 11.5–14.6)
WBC: 8.1 10*3/uL (ref 4.5–10.5)

## 2013-08-08 LAB — LIPID PANEL
Cholesterol: 162 mg/dL (ref 0–200)
HDL: 43.5 mg/dL (ref >39.00)
LDL Cholesterol: 106 mg/dL — ABNORMAL HIGH (ref 0–99)
Total CHOL/HDL Ratio: 4
Triglycerides: 64 mg/dL (ref 0.0–149.0)
VLDL: 12.8 mg/dL (ref 0.0–40.0)

## 2013-08-08 LAB — HEPATIC FUNCTION PANEL
ALK PHOS: 65 U/L (ref 39–117)
ALT: 23 U/L (ref 0–53)
AST: 18 U/L (ref 0–37)
Albumin: 3.9 g/dL (ref 3.5–5.2)
Bilirubin, Direct: 0.1 mg/dL (ref 0.0–0.3)
Total Bilirubin: 0.7 mg/dL (ref 0.3–1.2)
Total Protein: 6.7 g/dL (ref 6.0–8.3)

## 2013-08-08 LAB — BASIC METABOLIC PANEL
BUN: 14 mg/dL (ref 6–23)
CO2: 29 mEq/L (ref 19–32)
Calcium: 9.2 mg/dL (ref 8.4–10.5)
Chloride: 101 mEq/L (ref 96–112)
Creatinine, Ser: 0.8 mg/dL (ref 0.4–1.5)
GFR: 108.71 mL/min (ref >60.00)
GLUCOSE: 117 mg/dL — AB (ref 70–99)
POTASSIUM: 3.8 meq/L (ref 3.5–5.1)
Sodium: 138 mEq/L (ref 135–145)

## 2013-08-08 LAB — TSH: TSH: 0.72 u[IU]/mL (ref 0.35–5.50)

## 2013-08-08 LAB — MICROALBUMIN / CREATININE URINE RATIO
CREATININE, U: 106.2 mg/dL
MICROALB/CREAT RATIO: 0.5 mg/g (ref 0.0–30.0)
Microalb, Ur: 0.5 mg/dL (ref 0.0–1.9)

## 2013-08-08 LAB — HEMOGLOBIN A1C: HEMOGLOBIN A1C: 6.7 % — AB (ref 4.6–6.5)

## 2013-08-15 ENCOUNTER — Encounter: Payer: Self-pay | Admitting: Family Medicine

## 2013-08-15 ENCOUNTER — Ambulatory Visit (INDEPENDENT_AMBULATORY_CARE_PROVIDER_SITE_OTHER): Payer: 59 | Admitting: Family Medicine

## 2013-08-15 VITALS — BP 110/64 | HR 80 | Temp 97.6°F | Ht 72.0 in | Wt 199.0 lb

## 2013-08-15 DIAGNOSIS — Z0000 Encounter for general adult medical examination without abnormal findings: Secondary | ICD-10-CM

## 2013-08-15 MED ORDER — CIPROFLOXACIN HCL 500 MG PO TABS: 500.0000 mg | ORAL_TABLET | Freq: Two times a day (BID) | ORAL | Status: DC

## 2013-08-15 NOTE — Patient Instructions (Signed)
Fat and Cholesterol Control Diet  Fat and cholesterol levels in your blood and organs are influenced by your diet. High levels of fat and cholesterol may lead to diseases of the heart, small and large blood vessels, gallbladder, liver, and pancreas.  CONTROLLING FAT AND CHOLESTEROL WITH DIET  Although exercise and lifestyle factors are important, your diet is key. That is because certain foods are known to raise cholesterol and others to lower it. The goal is to balance foods for their effect on cholesterol and more importantly, to replace saturated and trans fat with other types of fat, such as monounsaturated fat, polyunsaturated fat, and omega-3 fatty acids.  On average, a person should consume no more than 15 to 17 g of saturated fat daily. Saturated and trans fats are considered "bad" fats, and they will raise LDL cholesterol. Saturated fats are primarily found in animal products such as meats, butter, and cream. However, that does not mean you need to give up all your favorite foods. Today, there are good tasting, low-fat, low-cholesterol substitutes for most of the things you like to eat. Choose low-fat or nonfat alternatives. Choose round or loin cuts of red meat. These types of cuts are lowest in fat and cholesterol. Chicken (without the skin), fish, veal, and ground turkey breast are great choices. Eliminate fatty meats, such as hot dogs and salami. Even shellfish have little or no saturated fat. Have a 3 oz (85 g) portion when you eat lean meat, poultry, or fish.  Trans fats are also called "partially hydrogenated oils." They are oils that have been scientifically manipulated so that they are solid at room temperature resulting in a longer shelf life and improved taste and texture of foods in which they are added. Trans fats are found in stick margarine, some tub margarines, cookies, crackers, and baked goods.   When baking and cooking, oils are a great substitute for butter. The monounsaturated oils are  especially beneficial since it is believed they lower LDL and raise HDL. The oils you should avoid entirely are saturated tropical oils, such as coconut and palm.   Remember to eat a lot from food groups that are naturally free of saturated and trans fat, including fish, fruit, vegetables, beans, grains (barley, rice, couscous, bulgur wheat), and pasta (without cream sauces).   IDENTIFYING FOODS THAT LOWER FAT AND CHOLESTEROL   Soluble fiber may lower your cholesterol. This type of fiber is found in fruits such as apples, vegetables such as broccoli, potatoes, and carrots, legumes such as beans, peas, and lentils, and grains such as barley. Foods fortified with plant sterols (phytosterol) may also lower cholesterol. You should eat at least 2 g per day of these foods for a cholesterol lowering effect.   Read package labels to identify low-saturated fats, trans fat free, and low-fat foods at the supermarket. Select cheeses that have only 2 to 3 g saturated fat per ounce. Use a heart-healthy tub margarine that is free of trans fats or partially hydrogenated oil. When buying baked goods (cookies, crackers), avoid partially hydrogenated oils. Breads and muffins should be made from whole grains (whole-wheat or whole oat flour, instead of "flour" or "enriched flour"). Buy non-creamy canned soups with reduced salt and no added fats.   FOOD PREPARATION TECHNIQUES   Never deep-fry. If you must fry, either stir-fry, which uses very little fat, or use non-stick cooking sprays. When possible, broil, bake, or roast meats, and steam vegetables. Instead of putting butter or margarine on vegetables, use lemon   and herbs, applesauce, and cinnamon (for squash and sweet potatoes). Use nonfat yogurt, salsa, and low-fat dressings for salads.   LOW-SATURATED FAT / LOW-FAT FOOD SUBSTITUTES  Meats / Saturated Fat (g)  · Avoid: Steak, marbled (3 oz/85 g) / 11 g  · Choose: Steak, lean (3 oz/85 g) / 4 g  · Avoid: Hamburger (3 oz/85 g) / 7  g  · Choose: Hamburger, lean (3 oz/85 g) / 5 g  · Avoid: Ham (3 oz/85 g) / 6 g  · Choose: Ham, lean cut (3 oz/85 g) / 2.4 g  · Avoid: Chicken, with skin, dark meat (3 oz/85 g) / 4 g  · Choose: Chicken, skin removed, dark meat (3 oz/85 g) / 2 g  · Avoid: Chicken, with skin, light meat (3 oz/85 g) / 2.5 g  · Choose: Chicken, skin removed, light meat (3 oz/85 g) / 1 g  Dairy / Saturated Fat (g)  · Avoid: Whole milk (1 cup) / 5 g  · Choose: Low-fat milk, 2% (1 cup) / 3 g  · Choose: Low-fat milk, 1% (1 cup) / 1.5 g  · Choose: Skim milk (1 cup) / 0.3 g  · Avoid: Hard cheese (1 oz/28 g) / 6 g  · Choose: Skim milk cheese (1 oz/28 g) / 2 to 3 g  · Avoid: Cottage cheese, 4% fat (1 cup) / 6.5 g  · Choose: Low-fat cottage cheese, 1% fat (1 cup) / 1.5 g  · Avoid: Ice cream (1 cup) / 9 g  · Choose: Sherbet (1 cup) / 2.5 g  · Choose: Nonfat frozen yogurt (1 cup) / 0.3 g  · Choose: Frozen fruit bar / trace  · Avoid: Whipped cream (1 tbs) / 3.5 g  · Choose: Nondairy whipped topping (1 tbs) / 1 g  Condiments / Saturated Fat (g)  · Avoid: Mayonnaise (1 tbs) / 2 g  · Choose: Low-fat mayonnaise (1 tbs) / 1 g  · Avoid: Butter (1 tbs) / 7 g  · Choose: Extra light margarine (1 tbs) / 1 g  · Avoid: Coconut oil (1 tbs) / 11.8 g  · Choose: Olive oil (1 tbs) / 1.8 g  · Choose: Corn oil (1 tbs) / 1.7 g  · Choose: Safflower oil (1 tbs) / 1.2 g  · Choose: Sunflower oil (1 tbs) / 1.4 g  · Choose: Soybean oil (1 tbs) / 2.4 g  · Choose: Canola oil (1 tbs) / 1 g  Document Released: 04/05/2005 Document Revised: 07/31/2012 Document Reviewed: 09/24/2010  ExitCare® Patient Information ©2014 [employer].

## 2013-08-15 NOTE — Progress Notes (Signed)
   Subjective:    Patient ID: Spencer Duffy, male    DOB: [birth year], 48 y.o.   MRN: [id number]  HPI Patient seen for wellness exam. He has type diabetes which has been very well controlled. Recent lab work through his company month ago and these lab results were reviewed. He's recently started exercise. Had foot surgery and has had great relief in his chronic foot pain since then hopes to start running again soon. He does not have any significant complications from his diabetes. Medications reviewed. Tetanus up-to-date.  Past Medical History  Diagnosis Date  . Arthritis   . Diabetes mellitus     TYPE II  . GERD (gastroesophageal reflux disease)   . Hay fever     ALLERGIES  . Heart murmur    Past Surgical History  Procedure Laterality Date  . Bilateral knee arthroscopy      X2  . Skin cancer excision      ON CHEST  . Tarsal exostectomy Right 06/21/2013    @ [place]  . Neuroplasty digital nerve Right 06/21/2013    @ [place]    reports that he has never smoked. He has quit using smokeless tobacco. He reports that he does not drink alcohol. His drug history is not on file. family history includes Arthritis in his other; Diabetes in his other; Hypertension in his other. No Known Allergies    Review of Systems  Constitutional: Negative for fever, activity change, appetite change and fatigue.  HENT: Negative for congestion, ear pain and trouble swallowing.   Eyes: Negative for pain and visual disturbance.  Respiratory: Negative for cough, shortness of breath and wheezing.   Cardiovascular: Negative for chest pain and palpitations.  Gastrointestinal: Negative for nausea, vomiting, abdominal pain, diarrhea, constipation, blood in stool, abdominal distention and rectal pain.  Genitourinary: Negative for dysuria, hematuria and testicular pain.  Musculoskeletal: Negative for arthralgias and joint swelling.  Skin: Negative for rash.  Neurological: Negative for dizziness, syncope and headaches.    Hematological: Negative for adenopathy.  Psychiatric/Behavioral: Negative for confusion and dysphoric mood.       Objective:   Physical Exam  Constitutional: He is oriented to person, place, and time. He appears well-developed and well-nourished. No distress.  HENT:  Head: Normocephalic and atraumatic.  Right Ear: External ear normal.  Left Ear: External ear normal.  Mouth/Throat: Oropharynx is clear and moist.  Eyes: Conjunctivae and EOM are normal. Pupils are equal, round, and reactive to light.  Neck: Normal range of motion. Neck supple. No thyromegaly present.  Cardiovascular: Normal rate, regular rhythm and normal heart sounds.   No murmur heard. Pulmonary/Chest: No respiratory distress. He has no wheezes. He has no rales.  Abdominal: Soft. Bowel sounds are normal. He exhibits no distension and no mass. There is no tenderness. There is no rebound and no guarding.  Genitourinary: Rectum normal and prostate normal.  Musculoskeletal: He exhibits no edema.  Lymphadenopathy:    He has no cervical adenopathy.  Neurological: He is alert and oriented to person, place, and time. He displays normal reflexes. No cranial nerve deficit.  Skin: No rash noted.  Psychiatric: He has a normal mood and affect.          Assessment & Plan:  Patient seen for complete physical. Labs reviewed. A1c 6.7%. We discussed strategies for weight control and he plans to started back running soon. His tetanus is up-to-date. Colonoscopy and PSA by age 45.

## 2013-08-15 NOTE — Progress Notes (Signed)
Pre visit review using our clinic review tool, if applicable. No additional management support is needed unless otherwise documented below in the visit note. 

## 2013-09-27 ENCOUNTER — Telehealth: Payer: Self-pay | Admitting: Family Medicine

## 2013-09-27 NOTE — Telephone Encounter (Signed)
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 Send to [facility] [place].

## 2013-09-27 NOTE — Telephone Encounter (Signed)
Last visit 08/15/13 

## 2013-09-27 NOTE — Telephone Encounter (Signed)
lunesta 3 mg one po qhs prn #30 with one refill.

## 2013-09-28 MED ORDER — ESZOPICLONE 3 MG PO TABS: 3.0000 mg | ORAL_TABLET | Freq: Every evening | ORAL | Status: DC | PRN

## 2013-09-28 NOTE — Telephone Encounter (Signed)
Pt aware that RX has been called into pharmacy

## 2013-11-29 ENCOUNTER — Other Ambulatory Visit: Payer: Self-pay | Admitting: Family Medicine

## 2013-12-13 LAB — HM DIABETES EYE EXAM

## 2013-12-14 ENCOUNTER — Encounter: Payer: Self-pay | Admitting: Family Medicine

## 2014-01-11 ENCOUNTER — Other Ambulatory Visit: Payer: Self-pay | Admitting: Family Medicine

## 2014-02-12 ENCOUNTER — Other Ambulatory Visit: Payer: Self-pay | Admitting: Family Medicine

## 2014-05-28 ENCOUNTER — Other Ambulatory Visit: Payer: Self-pay | Admitting: Dermatology

## 2014-06-20 ENCOUNTER — Other Ambulatory Visit: Payer: Self-pay | Admitting: Family Medicine

## 2014-07-20 ENCOUNTER — Other Ambulatory Visit: Payer: Self-pay | Admitting: Family Medicine

## 2014-07-21 ENCOUNTER — Other Ambulatory Visit: Payer: Self-pay | Admitting: Family Medicine

## 2014-08-15 ENCOUNTER — Other Ambulatory Visit (INDEPENDENT_AMBULATORY_CARE_PROVIDER_SITE_OTHER): Payer: 59

## 2014-08-15 DIAGNOSIS — Z0000 Encounter for general adult medical examination without abnormal findings: Secondary | ICD-10-CM

## 2014-08-15 LAB — HEPATIC FUNCTION PANEL
ALBUMIN: 4.3 g/dL (ref 3.5–5.2)
ALK PHOS: 66 U/L (ref 39–117)
ALT: 19 U/L (ref 0–53)
AST: 16 U/L (ref 0–37)
Bilirubin, Direct: 0.1 mg/dL (ref 0.0–0.3)
TOTAL PROTEIN: 6.5 g/dL (ref 6.0–8.3)
Total Bilirubin: 0.6 mg/dL (ref 0.2–1.2)

## 2014-08-15 LAB — CBC WITH DIFFERENTIAL/PLATELET
BASOS ABS: 0 10*3/uL (ref 0.0–0.1)
BASOS PCT: 0.4 % (ref 0.0–3.0)
EOS ABS: 0.1 10*3/uL (ref 0.0–0.7)
Eosinophils Relative: 1.3 % (ref 0.0–5.0)
HCT: 40.9 % (ref 39.0–52.0)
Hemoglobin: 13.7 g/dL (ref 13.0–17.0)
LYMPHS ABS: 1.8 10*3/uL (ref 0.7–4.0)
Lymphocytes Relative: 34.2 % (ref 12.0–46.0)
MCHC: 33.5 g/dL (ref 30.0–36.0)
MCV: 90.3 fl (ref 78.0–100.0)
Monocytes Absolute: 0.4 10*3/uL (ref 0.1–1.0)
Monocytes Relative: 7.8 % (ref 3.0–12.0)
Neutro Abs: 2.9 10*3/uL (ref 1.4–7.7)
Neutrophils Relative %: 56.3 % (ref 43.0–77.0)
PLATELETS: 270 10*3/uL (ref 150.0–400.0)
RBC: 4.53 Mil/uL (ref 4.22–5.81)
RDW: 12.9 % (ref 11.5–15.5)
WBC: 5.2 10*3/uL (ref 4.0–10.5)

## 2014-08-15 LAB — MICROALBUMIN / CREATININE URINE RATIO
Creatinine,U: 194.3 mg/dL
Microalb Creat Ratio: 0.4 mg/g (ref 0.0–30.0)
Microalb, Ur: 0.7 mg/dL (ref 0.0–1.9)

## 2014-08-15 LAB — LIPID PANEL
CHOL/HDL RATIO: 4
CHOLESTEROL: 172 mg/dL (ref 0–200)
HDL: 42.3 mg/dL (ref >39.00)
LDL CALC: 116 mg/dL — AB (ref 0–99)
NonHDL: 129.7
Triglycerides: 71 mg/dL (ref 0.0–149.0)
VLDL: 14.2 mg/dL (ref 0.0–40.0)

## 2014-08-15 LAB — BASIC METABOLIC PANEL
BUN: 16 mg/dL (ref 6–23)
CALCIUM: 9.2 mg/dL (ref 8.4–10.5)
CO2: 30 meq/L (ref 19–32)
CREATININE: 0.91 mg/dL (ref 0.40–1.50)
Chloride: 101 mEq/L (ref 96–112)
GFR: 94.63 mL/min (ref >60.00)
GLUCOSE: 120 mg/dL — AB (ref 70–99)
Potassium: 3.8 mEq/L (ref 3.5–5.1)
Sodium: 135 mEq/L (ref 135–145)

## 2014-08-15 LAB — HEMOGLOBIN A1C: Hgb A1c MFr Bld: 6.7 % — ABNORMAL HIGH (ref 4.6–6.5)

## 2014-08-15 LAB — TSH: TSH: 1.24 u[IU]/mL (ref 0.35–4.50)

## 2014-08-21 ENCOUNTER — Encounter: Payer: Self-pay | Admitting: Family Medicine

## 2014-08-21 ENCOUNTER — Ambulatory Visit (INDEPENDENT_AMBULATORY_CARE_PROVIDER_SITE_OTHER): Payer: 59 | Admitting: Family Medicine

## 2014-08-21 VITALS — BP 120/80 | HR 72 | Temp 98.0°F | Ht 71.0 in | Wt 200.0 lb

## 2014-08-21 DIAGNOSIS — Z0000 Encounter for general adult medical examination without abnormal findings: Secondary | ICD-10-CM

## 2014-08-21 MED ORDER — PIOGLITAZONE HCL-METFORMIN ER 15-1000 MG PO TB24: 1.0000 | ORAL_TABLET | Freq: Every day | ORAL | Status: DC

## 2014-08-21 NOTE — Patient Instructions (Signed)
Coronary Calcium Scan A coronary calcium scan is an imaging test used to look for deposits of calcium and other fatty materials (plaques) in the inner lining of the blood vessels of your heart (coronary arteries). These deposits of calcium and plaques can partly clog and narrow the coronary arteries without producing any symptoms or warning signs. This puts you at risk for a heart attack. This test can detect these deposits before symptoms develop.  LET [facility] CARE PROVIDER KNOW ABOUT:  Any allergies you have.  All medicines you are taking, including vitamins, herbs, eye drops, creams, and over-the-counter medicines.  Previous problems you or members of your family have had with the use of anesthetics.  Any blood disorders you have.  Previous surgeries you have had.  Medical conditions you have.  Possibility of pregnancy, if this applies. RISKS AND COMPLICATIONS Generally, this is a safe procedure. However, as with any procedure, complications can occur. This test involves the use of radiation. Radiation exposure can be dangerous to a pregnant woman and her unborn baby. If you are pregnant, you should not have this procedure done.  BEFORE THE PROCEDURE There is no special preparation for the procedure. PROCEDURE  You will need to undress and put on a hospital gown. You will need to remove any jewelry around your neck or chest.  Sticky electrodes are placed on your chest and are connected to an electrocardiogram (EKG or electrocardiography) machine to recorda tracing of the electrical activity of your heart.  A CT scanner will take pictures of your heart. During this time, you will be asked to lie still and hold your breath for 2-3 seconds while a picture is being taken of your heart. AFTER THE PROCEDURE   You will be allowed to get dressed.  You can return to your normal activities after the scan is done. Document Released: 10/02/2007 Document Revised: 04/10/2013 Document  Reviewed: 12/11/2012 [facility] Patient Information 2015 [place], [place]. This information is not intended to replace advice given to you by your health care provider. Make sure you discuss any questions you have with your health care provider.

## 2014-08-21 NOTE — Progress Notes (Signed)
Pre visit review using our clinic review tool, if applicable. No additional management support is needed unless otherwise documented below in the visit note. 

## 2014-08-21 NOTE — Progress Notes (Signed)
Subjective:    Patient ID: Spencer Duffy, male    DOB: [birth year], 49 y.o.   MRN: [id number]  HPI Patient seen for complete physical. He has history of type 2 diabetes which has been well controlled for several years. Sees ophthalmologist yearly. Nonsmoker. He's had some mild weight gain during the past year. Recently started back running. No chest pains. No family history of premature CAD. He had recent work screening and reportedly had Framingham ten-year risk of 15%. He's had long-standing history of mild hyperlipidemia. He has declined statin use. Blood sugars well controlled. No hypoglycemia. Tetanus up-to-date. He's had previous Pneumovax.  He had basal cell carcinoma removed right face during the past year. No recurrence  Past Medical History  Diagnosis Date  . Arthritis   . Diabetes mellitus     TYPE II  . GERD (gastroesophageal reflux disease)   . Hay fever     ALLERGIES  . Heart murmur    Past Surgical History  Procedure Laterality Date  . Bilateral knee arthroscopy      X2  . Skin cancer excision      ON CHEST  . Tarsal exostectomy Right 06/21/2013    @ [place]  . Neuroplasty digital nerve Right 06/21/2013    @ [place]    reports that he has never smoked. He has quit using smokeless tobacco. He reports that he does not drink alcohol. His drug history is not on file. family history includes Arthritis in his other; Diabetes in his other; Hypertension in his other. No Known Allergies    Review of Systems  Constitutional: Negative for fever, activity change, appetite change and fatigue.  HENT: Negative for congestion, ear pain and trouble swallowing.   Eyes: Negative for pain and visual disturbance.  Respiratory: Negative for cough, shortness of breath and wheezing.   Cardiovascular: Negative for chest pain and palpitations.  Gastrointestinal: Negative for nausea, vomiting, abdominal pain, diarrhea, constipation, blood in stool, abdominal distention and rectal pain.    Genitourinary: Negative for dysuria, hematuria and testicular pain.  Musculoskeletal: Negative for joint swelling and arthralgias.  Skin: Negative for rash.  Neurological: Negative for dizziness, syncope and headaches.  Hematological: Negative for adenopathy.  Psychiatric/Behavioral: Negative for confusion and dysphoric mood.       Objective:   Physical Exam  Constitutional: He is oriented to person, place, and time. He appears well-developed and well-nourished. No distress.  HENT:  Head: Normocephalic and atraumatic.  Right Ear: External ear normal.  Left Ear: External ear normal.  Mouth/Throat: Oropharynx is clear and moist.  Eyes: Conjunctivae and EOM are normal. Pupils are equal, round, and reactive to light.  Neck: Normal range of motion. Neck supple. No thyromegaly present.  Cardiovascular: Normal rate, regular rhythm and normal heart sounds.   No murmur heard. Pulmonary/Chest: No respiratory distress. He has no wheezes. He has no rales.  Abdominal: Soft. Bowel sounds are normal. He exhibits no distension and no mass. There is no tenderness. There is no rebound and no guarding.  Musculoskeletal: He exhibits no edema.  Lymphadenopathy:    He has no cervical adenopathy.  Neurological: He is alert and oriented to person, place, and time. He displays normal reflexes. No cranial nerve deficit.  Skin: No rash noted.  Psychiatric: He has a normal mood and affect.          Assessment & Plan:  Complete physical. Labs reviewed. LDL 116. We discussed pros and cons of statin use. He is uncertain. Set up  coronary calcium scoring after discussing risk and benefits.

## 2014-09-06 ENCOUNTER — Telehealth (INDEPENDENT_AMBULATORY_CARE_PROVIDER_SITE_OTHER): Payer: 59 | Admitting: Family Medicine

## 2014-09-06 DIAGNOSIS — Z8249 Family history of ischemic heart disease and other diseases of the circulatory system: Secondary | ICD-10-CM

## 2014-09-06 NOTE — Telephone Encounter (Signed)
Please advise 

## 2014-09-06 NOTE — Telephone Encounter (Signed)
Pt call to say when he had his physical Dr Elease Hashimoto mention to him about a Coronary Calcium scan. He is asking if Dr Elease Hashimoto still would like him to have the test

## 2014-09-09 ENCOUNTER — Other Ambulatory Visit: Payer: Self-pay | Admitting: Family Medicine

## 2014-09-09 DIAGNOSIS — Z8249 Family history of ischemic heart disease and other diseases of the circulatory system: Secondary | ICD-10-CM

## 2014-09-09 NOTE — Telephone Encounter (Signed)
Pt informed

## 2014-09-17 ENCOUNTER — Other Ambulatory Visit: Payer: Self-pay | Admitting: Family Medicine

## 2014-09-18 ENCOUNTER — Ambulatory Visit (INDEPENDENT_AMBULATORY_CARE_PROVIDER_SITE_OTHER)
Admission: RE | Admit: 2014-09-18 | Discharge: 2014-09-18 | Disposition: A | Discharge status: Home / Self Care | Payer: Self-pay | Source: Ambulatory Visit | Attending: Family Medicine | Admitting: Family Medicine

## 2014-09-18 DIAGNOSIS — Z8249 Family history of ischemic heart disease and other diseases of the circulatory system: Secondary | ICD-10-CM

## 2014-09-25 ENCOUNTER — Ambulatory Visit (INDEPENDENT_AMBULATORY_CARE_PROVIDER_SITE_OTHER): Payer: 59 | Admitting: Family Medicine

## 2014-09-25 ENCOUNTER — Encounter: Payer: Self-pay | Admitting: Family Medicine

## 2014-09-25 VITALS — BP 130/68 | HR 65 | Temp 97.8°F | Wt 201.0 lb

## 2014-09-25 DIAGNOSIS — M7752 Other enthesopathy of left foot: Secondary | ICD-10-CM

## 2014-09-25 MED ORDER — DICLOFENAC SODIUM 1 % TD GEL: 2.0000 g | Freq: Four times a day (QID) | TRANSDERMAL | Status: DC

## 2014-09-25 MED ORDER — ESZOPICLONE 3 MG PO TABS: 3.0000 mg | ORAL_TABLET | Freq: Every evening | ORAL | Status: DC | PRN

## 2014-09-25 NOTE — Patient Instructions (Signed)
Bursitis °Bursitis is a swelling and soreness (inflammation) of a fluid-filled sac (bursa) that overlies and protects a joint. It can be caused by injury, overuse of the joint, arthritis or infection. The joints most likely to be affected are the elbows, shoulders, hips and knees. °HOME CARE INSTRUCTIONS  °· Apply ice to the affected area for 15-20 minutes each hour while awake for 2 days. Put the ice in a plastic bag and place a towel between the bag of ice and your skin. °· Rest the injured joint as much as possible, but continue to put the joint through a full range of motion, 4 times per day. (The shoulder joint especially becomes rapidly "frozen" if not used.) When the pain lessens, begin normal slow movements and usual activities. °· Only take over-the-counter or prescription medicines for pain, discomfort or fever as directed by your caregiver. °· Your caregiver may recommend draining the bursa and injecting medicine into the bursa. This may help the healing process. °· Follow all instructions for follow-up with your caregiver. This includes any orthopedic referrals, physical therapy and rehabilitation. Any delay in obtaining necessary care could result in a delay or failure of the bursitis to heal and chronic pain. °SEEK IMMEDIATE MEDICAL CARE IF:  °· Your pain increases even during treatment. °· You develop an oral temperature above 102° F (38.9° C) and have heat and inflammation over the involved bursa. °MAKE SURE YOU:  °· Understand these instructions. °· Will watch your condition. °· Will get help right away if you are not doing well or get worse. °Document Released: 04/02/2000 Document Revised: 06/28/2011 Document Reviewed: 06/25/2013 °ExitCare® Patient Information ©2015 [employer]. This information is not intended to replace advice given to you by your health care provider. Make sure you discuss any questions you have with your health care provider. ° °

## 2014-09-25 NOTE — Progress Notes (Signed)
   Subjective:    Patient ID: Spencer Duffy, male    DOB: Jun 24, 1965, 49 y.o.   MRN: [id number]  HPI Patient seen with left posterior heel pain. This is in the retrocalcaneal region. He's been held her run and generally has some pain in the first few steps running but then the pain goes completely away. His pain is worse first in the morning. He is not have any pain on the bottom of the foot this is retrocalcaneal. No visible swelling. No erythema or warmth. Pain is moderate at times. He has not tried any icing. Denies any injury.  Past Medical History  Diagnosis Date  . Arthritis   . Diabetes mellitus     TYPE II  . GERD (gastroesophageal reflux disease)   . Hay fever     ALLERGIES  . Heart murmur    Past Surgical History  Procedure Laterality Date  . Bilateral knee arthroscopy      X2  . Skin cancer excision      ON CHEST  . Tarsal exostectomy Right 06/21/2013    @ [place]  . Neuroplasty digital nerve Right 06/21/2013    @ [place]    reports that he has never smoked. He has quit using smokeless tobacco. He reports that he does not drink alcohol. His drug history is not on file. family history includes Arthritis in his other; Diabetes in his other; Hypertension in his other. No Known Allergies    Review of Systems  Constitutional: Negative for fever and chills.       Objective:   Physical Exam  Constitutional: He appears well-developed and well-nourished.  Cardiovascular: Normal rate and regular rhythm.   Musculoskeletal:  Left foot examined. No Achilles tenderness. Full range of motion ankle. He has some retrocalcaneal tenderness but no visible swelling. No warmth. Plantar fascia nontender          Assessment & Plan:  Posterior left foot pain. Differential is retrocalcaneal bursitis versus sesamoiditis. No evidence for Achilles tendinitis. Voltaren gel 3 times daily. Try icing- especially after activities. Reviewed adequate stretching.

## 2014-09-25 NOTE — Progress Notes (Signed)
Pre visit review using our clinic review tool, if applicable. No additional management support is needed unless otherwise documented below in the visit note. 

## 2014-10-24 ENCOUNTER — Other Ambulatory Visit: Payer: Self-pay | Admitting: *Deleted

## 2014-10-24 MED ORDER — METFORMIN HCL 500 MG PO TABS: 500.0000 mg | ORAL_TABLET | Freq: Every day | ORAL | Status: DC

## 2014-12-16 LAB — HM DIABETES EYE EXAM

## 2015-01-01 ENCOUNTER — Encounter: Payer: Self-pay | Admitting: Family Medicine

## 2015-04-22 ENCOUNTER — Other Ambulatory Visit: Payer: Self-pay | Admitting: Family Medicine

## 2015-06-13 ENCOUNTER — Other Ambulatory Visit: Payer: Self-pay | Admitting: Family Medicine

## 2015-07-21 ENCOUNTER — Other Ambulatory Visit: Payer: Self-pay | Admitting: Family Medicine

## 2015-07-21 NOTE — Telephone Encounter (Signed)
Pt last visit 09/25/14 Pt last refill 09/25/14 #30 with 3 refills

## 2015-07-21 NOTE — Telephone Encounter (Signed)
Refill #30

## 2015-07-24 ENCOUNTER — Other Ambulatory Visit: Payer: Self-pay | Admitting: Family Medicine

## 2015-08-04 ENCOUNTER — Ambulatory Visit (INDEPENDENT_AMBULATORY_CARE_PROVIDER_SITE_OTHER): Payer: 59 | Admitting: Family Medicine

## 2015-08-04 ENCOUNTER — Encounter: Payer: Self-pay | Admitting: Family Medicine

## 2015-08-04 VITALS — BP 100/70 | HR 97 | Temp 98.0°F | Ht 71.0 in | Wt 205.0 lb

## 2015-08-04 DIAGNOSIS — H6502 Acute serous otitis media, left ear: Secondary | ICD-10-CM | POA: Diagnosis not present

## 2015-08-04 NOTE — Progress Notes (Signed)
Pre visit review using our clinic review tool, if applicable. No additional management support is needed unless otherwise documented below in the visit note. 

## 2015-08-04 NOTE — Progress Notes (Signed)
   Subjective:    Patient ID: Spencer Duffy, male    DOB: Jun 15, 1965, 50 y.o.   MRN: [id number]  HPI  Patient seen for acute visit  left ear fullness. Patient developed viral URI type symptoms couple weeks ago.  Last Friday he returned from [place] after full week of flying. He noticed some pain in his left ear with disc sent on a couple of occasions. Mild right care symptoms but left predominantly. He notice persistent sensation of left ear feeling "stopped up "since his last flight. He had some sinus pressure.  Has taken Sudafed. [facility] and [facility] doxy[facility]ine but not clear the signs of infection. He has not had any fevers or chills. Hearing seems somewhat "muffled "left ear  no active allergy symptoms.  Past Medical History  Diagnosis Date  . Arthritis   . Diabetes mellitus     TYPE II  . GERD (gastroesophageal reflux disease)   . Hay fever     ALLERGIES  . Heart murmur    Past Surgical History  Procedure Laterality Date  . Bilateral knee arthroscopy      X2  . Skin cancer excision      ON CHEST  . Tarsal exostectomy Right 06/21/2013    @ [place]  . Neuroplasty digital nerve Right 06/21/2013    @ [place]    reports that he has never smoked. He has quit using smokeless tobacco. He reports that he does not drink alcohol. His drug history is not on file. family history includes Arthritis in his other; Diabetes in his other; Hypertension in his other. No Known Allergies    Review of Systems  Constitutional: Negative for fever and chills.  HENT: Positive for congestion. Negative for ear discharge.        Objective:   Physical Exam  Constitutional: He appears well-developed and well-nourished.  HENT:  Right Ear: External ear normal.  Mouth/Throat: Oropharynx is clear and moist.  Left eardrum small serous effusion inferiorly. No bulging.No erythema. He has some minimal amber-colored clear fluid Right eardrum normal  Cardiovascular: Normal rate and regular  rhythm.   Pulmonary/Chest: Effort normal and breath sounds normal. No respiratory distress. He has no wheezes. He has no rales.          Assessment & Plan:   Left serous effusion. Probably triggered by recent viral URI. We explained these frequent take time to resolve and that medications such as decongestants, steroids, antihistamines generally do not work well. He will finish out doxycycline which was prescribed elsewhere though at this time no evidence for some changes.

## 2015-08-04 NOTE — Patient Instructions (Signed)
Serous Otitis Media Serous otitis media is fluid in the middle ear space. This space contains the bones for hearing and air. Air in the middle ear space helps to transmit sound.  The air gets there through the eustachian tube. This tube goes from the back of the nose (nasopharynx) to the middle ear space. It keeps the pressure in the middle ear the same as the outside world. It also helps to drain fluid from the middle ear space. CAUSES  Serous otitis media occurs when the eustachian tube gets blocked. Blockage can come from:  Ear infections.  Colds and other upper respiratory infections.  Allergies.  Irritants such as cigarette smoke.  Sudden changes in air pressure (such as descending in an airplane).  Enlarged adenoids.  A mass in the nasopharynx. During colds and upper respiratory infections, the middle ear space can become temporarily filled with fluid. This can happen after an ear infection also. Once the infection clears, the fluid will generally drain out of the ear through the eustachian tube. If it does not, then serous otitis media occurs. SIGNS AND SYMPTOMS   Hearing loss.  A feeling of fullness in the ear, without pain.  Young children may not show any symptoms but may show slight behavioral changes, such as agitation, ear pulling, or crying. DIAGNOSIS  Serous otitis media is diagnosed by an ear exam. Tests may be done to check on the movement of the eardrum. Hearing exams may also be done. TREATMENT  The fluid most often goes away without treatment. If allergy is the cause, allergy treatment may be helpful. Fluid that persists for several months may require minor surgery. A small tube is placed in the eardrum to:  Drain the fluid.  Restore the air in the middle ear space. In certain situations, antibiotic medicines are used to avoid surgery. Surgery may be done to remove enlarged adenoids (if this is the cause). HOME CARE INSTRUCTIONS   Keep children away from  tobacco smoke.  Keep all follow-up visits as directed by your health care provider. SEEK MEDICAL CARE IF:   Your hearing is not better in 3 months.  Your hearing is worse.  You have ear pain.  You have drainage from the ear.  You have dizziness.  You have serous otitis media only in one ear or have any bleeding from your nose (epistaxis).  You notice a lump on your neck. MAKE SURE YOU:  Understand these instructions.   Will watch your condition.   Will get help right away if you are not doing well or get worse.    This information is not intended to replace advice given to you by your health care provider. Make sure you discuss any questions you have with your health care provider.   Document Released: 06/26/2003 Document Revised: 04/26/2014 Document Reviewed: 10/31/2012 Elsevier Interactive Patient Education 2016 Else[employer] Inc.  

## 2015-10-20 ENCOUNTER — Other Ambulatory Visit: Payer: Self-pay | Admitting: Family Medicine

## 2015-10-24 ENCOUNTER — Other Ambulatory Visit: Payer: Self-pay | Admitting: Family Medicine

## 2015-11-11 ENCOUNTER — Encounter: Payer: 59 | Admitting: Family Medicine

## 2015-11-24 ENCOUNTER — Other Ambulatory Visit (INDEPENDENT_AMBULATORY_CARE_PROVIDER_SITE_OTHER): Payer: 59

## 2015-11-24 DIAGNOSIS — Z0000 Encounter for general adult medical examination without abnormal findings: Secondary | ICD-10-CM

## 2015-11-24 LAB — BASIC METABOLIC PANEL
BUN: 21 mg/dL (ref 6–23)
CALCIUM: 9.2 mg/dL (ref 8.4–10.5)
CO2: 26 mEq/L (ref 19–32)
Chloride: 101 mEq/L (ref 96–112)
Creatinine, Ser: 0.92 mg/dL (ref 0.40–1.50)
GFR: 92.94 mL/min (ref >60.00)
Glucose, Bld: 126 mg/dL — ABNORMAL HIGH (ref 70–99)
Potassium: 4.4 mEq/L (ref 3.5–5.1)
Sodium: 135 mEq/L (ref 135–145)

## 2015-11-24 LAB — HEPATIC FUNCTION PANEL
ALK PHOS: 67 U/L (ref 39–117)
ALT: 20 U/L (ref 0–53)
AST: 18 U/L (ref 0–37)
Albumin: 4.2 g/dL (ref 3.5–5.2)
Bilirubin, Direct: 0.1 mg/dL (ref 0.0–0.3)
Total Bilirubin: 0.5 mg/dL (ref 0.2–1.2)
Total Protein: 6.3 g/dL (ref 6.0–8.3)

## 2015-11-24 LAB — CBC WITH DIFFERENTIAL/PLATELET
BASOS ABS: 0 10*3/uL (ref 0.0–0.1)
Basophils Relative: 0.3 % (ref 0.0–3.0)
Eosinophils Absolute: 0 10*3/uL (ref 0.0–0.7)
Eosinophils Relative: 0.9 % (ref 0.0–5.0)
HCT: 39.5 % (ref 39.0–52.0)
Hemoglobin: 13.4 g/dL (ref 13.0–17.0)
LYMPHS ABS: 1.8 10*3/uL (ref 0.7–4.0)
Lymphocytes Relative: 30.9 % (ref 12.0–46.0)
MCHC: 33.8 g/dL (ref 30.0–36.0)
MCV: 89.8 fl (ref 78.0–100.0)
MONOS PCT: 7.5 % (ref 3.0–12.0)
Monocytes Absolute: 0.4 10*3/uL (ref 0.1–1.0)
NEUTROS ABS: 3.4 10*3/uL (ref 1.4–7.7)
Neutrophils Relative %: 60.4 % (ref 43.0–77.0)
PLATELETS: 276 10*3/uL (ref 150.0–400.0)
RBC: 4.4 Mil/uL (ref 4.22–5.81)
RDW: 12.8 % (ref 11.5–15.5)
WBC: 5.7 10*3/uL (ref 4.0–10.5)

## 2015-11-24 LAB — MICROALBUMIN / CREATININE URINE RATIO
CREATININE, U: 69.5 mg/dL
MICROALB/CREAT RATIO: 1 mg/g (ref 0.0–30.0)
Microalb, Ur: <0.7 mg/dL (ref 0.0–1.9)

## 2015-11-24 LAB — LIPID PANEL
CHOLESTEROL: 162 mg/dL (ref 0–200)
HDL: 42.5 mg/dL (ref >39.00)
LDL Cholesterol: 109 mg/dL — ABNORMAL HIGH (ref 0–99)
NONHDL: 119.06
Total CHOL/HDL Ratio: 4
Triglycerides: 51 mg/dL (ref 0.0–149.0)
VLDL: 10.2 mg/dL (ref 0.0–40.0)

## 2015-11-24 LAB — TSH: TSH: 1.47 u[IU]/mL (ref 0.35–4.50)

## 2015-11-24 LAB — HEMOGLOBIN A1C: Hgb A1c MFr Bld: 6.8 % — ABNORMAL HIGH (ref 4.6–6.5)

## 2015-12-01 ENCOUNTER — Encounter: Payer: Self-pay | Admitting: Family Medicine

## 2015-12-01 ENCOUNTER — Ambulatory Visit (INDEPENDENT_AMBULATORY_CARE_PROVIDER_SITE_OTHER): Payer: 59 | Admitting: Family Medicine

## 2015-12-01 VITALS — BP 90/70 | HR 62 | Temp 97.9°F | Ht 71.0 in | Wt 200.7 lb

## 2015-12-01 DIAGNOSIS — Z0000 Encounter for general adult medical examination without abnormal findings: Secondary | ICD-10-CM | POA: Diagnosis not present

## 2015-12-01 MED ORDER — METFORMIN HCL 500 MG PO TABS: 500.0000 mg | ORAL_TABLET | Freq: Two times a day (BID) | ORAL | 3 refills | Status: DC

## 2015-12-01 MED ORDER — ESZOPICLONE 3 MG PO TABS: 3.0000 mg | ORAL_TABLET | Freq: Every evening | ORAL | 0 refills | Status: DC | PRN

## 2015-12-01 NOTE — Progress Notes (Signed)
Pre visit review using our clinic review tool, if applicable. No additional management support is needed unless otherwise documented below in the visit note. 

## 2015-12-01 NOTE — Patient Instructions (Signed)
Discontinue ActosMet Start Metformin 500 mg po bid

## 2015-12-01 NOTE — Progress Notes (Signed)
Subjective:     Patient ID: Spencer Duffy, male   DOB: 1965/08/20, 50 y.o.   MRN: [id number]  HPI Patient seen for physical exam He has history of type 2 diabetes, GERD, mild hyperlipidemia. Diabetes has been stable for several years. No neuropathy symptoms. He gets yearly eye exams.  He recently ran out of his Actos met and increased his plain metformin to 500 milligrams twice a day and did not see any worsening of blood sugars. He has made some dietary changes over the past couple months and has lost 10 pounds due to his efforts.  Coronary calcium score of 1 last year. No recent chest pains. Exercises several days per week.  He turns 51 next year  Recent fasting blood sugars have been consistently low 100s Patient had basal cell carcinoma excision below his right last year and has scheduled follow-up with dermatology  Past Medical History:  Diagnosis Date  . Arthritis   . Diabetes mellitus    TYPE II  . GERD (gastroesophageal reflux disease)   . Hay fever    ALLERGIES  . Heart murmur    Past Surgical History:  Procedure Laterality Date  . BILATERAL KNEE ARTHROSCOPY     X2  . Neuroplasty Digital Nerve Right 06/21/2013   @ [place]  . SKIN CANCER EXCISION     ON CHEST  . Tarsal Exostectomy Right 06/21/2013   @ [place]    reports that he has never smoked. He has quit using smokeless tobacco. He reports that he does not drink alcohol. His drug history is not on file. family history includes Arthritis in his other; Diabetes in his other; Hypertension in his other. No Known Allergies   Review of Systems  Constitutional: Negative for activity change, appetite change, fatigue, fever and unexpected weight change.  HENT: Negative for congestion, ear pain and trouble swallowing.   Eyes: Negative for pain and visual disturbance.  Respiratory: Negative for cough, shortness of breath and wheezing.   Cardiovascular: Negative for chest pain and palpitations.  Gastrointestinal: Negative for abdominal  distention, abdominal pain, blood in stool, constipation, diarrhea, nausea, rectal pain and vomiting.  Endocrine: Negative for polydipsia and polyuria.  Genitourinary: Negative for dysuria, hematuria and testicular pain.  Musculoskeletal: Positive for arthralgias. Negative for joint swelling.  Skin: Negative for rash.  Neurological: Negative for dizziness, syncope and headaches.  Hematological: Negative for adenopathy.  Psychiatric/Behavioral: Negative for confusion and dysphoric mood.       Objective:   Physical Exam  Constitutional: He is oriented to person, place, and time. He appears well-developed and well-nourished. No distress.  HENT:  Head: Normocephalic and atraumatic.  Right Ear: External ear normal.  Left Ear: External ear normal.  Mouth/Throat: Oropharynx is clear and moist.  Eyes: Conjunctivae and EOM are normal. Pupils are equal, round, and reactive to light.  Neck: Normal range of motion. Neck supple. No thyromegaly present.  Cardiovascular: Normal rate, regular rhythm and normal heart sounds.   No murmur heard. Pulmonary/Chest: No respiratory distress. He has no wheezes. He has no rales.  Abdominal: Soft. Bowel sounds are normal. He exhibits no distension and no mass. There is no tenderness. There is no rebound and no guarding.  Musculoskeletal: He exhibits no edema.  Lymphadenopathy:    He has no cervical adenopathy.  Neurological: He is alert and oriented to person, place, and time. He displays normal reflexes. No cranial nerve deficit.  Skin: No rash noted.  Psychiatric: He has a normal mood and affect.  Assessment:     Physical exam. Labs reviewed with no major concerns. Diabetes been well controlled    Plan:     -Try discontinuing Actos met. -Increased regular metformin to 500 milligrams twice a day -Continue weight loss efforts. -Repeat A1c in 3 months -Screening colonoscopy by next year  Eulas Post MD [facility] at  [facility]

## 2015-12-10 ENCOUNTER — Other Ambulatory Visit: Payer: Self-pay | Admitting: Family Medicine

## 2015-12-19 LAB — HM DIABETES EYE EXAM

## 2015-12-24 ENCOUNTER — Encounter: Payer: Self-pay | Admitting: Family Medicine

## 2016-03-03 ENCOUNTER — Ambulatory Visit: Payer: 59 | Admitting: Family Medicine

## 2016-03-08 ENCOUNTER — Encounter: Payer: Self-pay | Admitting: Family Medicine

## 2016-03-08 ENCOUNTER — Ambulatory Visit (INDEPENDENT_AMBULATORY_CARE_PROVIDER_SITE_OTHER): Payer: 59 | Admitting: Family Medicine

## 2016-03-08 VITALS — BP 94/70 | HR 86 | Temp 98.2°F | Ht 71.0 in | Wt 192.0 lb

## 2016-03-08 DIAGNOSIS — E119 Type 2 diabetes mellitus without complications: Secondary | ICD-10-CM | POA: Diagnosis not present

## 2016-03-08 LAB — POCT GLYCOSYLATED HEMOGLOBIN (HGB A1C): Hemoglobin A1C: 6.6

## 2016-03-08 NOTE — Progress Notes (Signed)
Subjective:     Patient ID: Spencer Duffy, male   DOB: 05-Jan-1966, 50 y.o.   MRN: [id number]  HPI  Patient seen for follow-up type 2 diabetes. Recent discontinuation of Actos. He remains on metformin. Fasting blood sugars generally have been well controlled. Last A1c 6.8%. No polyuria or polydipsia.  Has recently started back running.    Past Medical History:  Diagnosis Date  . Arthritis   . Diabetes mellitus    TYPE II  . GERD (gastroesophageal reflux disease)   . Hay fever    ALLERGIES  . Heart murmur    Past Surgical History:  Procedure Laterality Date  . BILATERAL KNEE ARTHROSCOPY     X2  . Neuroplasty Digital Nerve Right 06/21/2013   @ [place]  . SKIN CANCER EXCISION     ON CHEST  . Tarsal Exostectomy Right 06/21/2013   @ [place]    reports that he has never smoked. He has quit using smokeless tobacco. He reports that he does not drink alcohol. His drug history is not on file. family history includes Arthritis in his other; Diabetes in his other; Hypertension in his other. No Known Allergies   Review of Systems  Constitutional: Negative for fatigue.  Eyes: Negative for visual disturbance.  Respiratory: Negative for cough, chest tightness and shortness of breath.   Cardiovascular: Negative for chest pain, palpitations and leg swelling.  Endocrine: Negative for polydipsia and polyuria.  Neurological: Negative for dizziness, syncope, weakness, light-headedness and headaches.       Objective:   Physical Exam  Constitutional: He is oriented to person, place, and time. He appears well-developed and well-nourished.  HENT:  Right Ear: External ear normal.  Left Ear: External ear normal.  Mouth/Throat: Oropharynx is clear and moist.  Eyes: Pupils are equal, round, and reactive to light.  Neck: Neck supple. No thyromegaly present.  Cardiovascular: Normal rate and regular rhythm.   Pulmonary/Chest: Effort normal and breath sounds normal. No respiratory distress. He has no wheezes. He  has no rales.  Musculoskeletal: He exhibits no edema.  Neurological: He is alert and oriented to person, place, and time.       Assessment:     Type 2 diabetes. History of good control. Recent discontinuation of Actos    Plan:     -repeat A1c today (6.6%) -continue with Metformin. -routine follow up in 6 Months.  Eulas Post MD [facility]

## 2016-03-08 NOTE — Progress Notes (Signed)
Pre visit review using our clinic review tool, if applicable. No additional management support is needed unless otherwise documented below in the visit note. 

## 2016-03-09 ENCOUNTER — Other Ambulatory Visit: Payer: Self-pay | Admitting: Family Medicine

## 2016-04-20 DIAGNOSIS — J069 Acute upper respiratory infection, unspecified: Secondary | ICD-10-CM | POA: Diagnosis not present

## 2016-04-20 DIAGNOSIS — J00 Acute nasopharyngitis [common cold]: Secondary | ICD-10-CM | POA: Diagnosis not present

## 2016-07-21 ENCOUNTER — Encounter: Payer: Self-pay | Admitting: Family Medicine

## 2016-07-21 ENCOUNTER — Ambulatory Visit (INDEPENDENT_AMBULATORY_CARE_PROVIDER_SITE_OTHER): Payer: 59 | Admitting: Family Medicine

## 2016-07-21 VITALS — BP 110/70 | HR 77 | Temp 98.3°F | Wt 197.3 lb

## 2016-07-21 DIAGNOSIS — M25521 Pain in right elbow: Secondary | ICD-10-CM

## 2016-07-21 DIAGNOSIS — R202 Paresthesia of skin: Secondary | ICD-10-CM | POA: Diagnosis not present

## 2016-07-21 MED ORDER — ESZOPICLONE 3 MG PO TABS: 3.0000 mg | ORAL_TABLET | Freq: Every evening | ORAL | 1 refills | Status: DC | PRN

## 2016-07-21 NOTE — Patient Instructions (Signed)
We will set up referral to Sports Medicine

## 2016-07-21 NOTE — Progress Notes (Signed)
Subjective:     Patient ID: Spencer Duffy, male   DOB: 10/02/1965, 51 y.o.   MRN: [id number]  HPI Patient's seen with right elbow pain. He's had some lateral epicondylitis in the past but this pain is different. Duration is 2 months. No injury. Location is posterior elbow. He is still able to do things like pushups without difficulty. Has not seen any erythema, ecchymosis, or swelling. Pain is somewhat intermittent but becoming worse over the past 2 months. Starting to awaken at night. He denies any neck pain. Occasional pain with gripping but he does not have any tenderness over the lateral epicondylar region.  No olecranon swelling.  Also complains of intermittent numbness involving mostly the right thumb, index, and middle finger. Denies wrist pain. Tried wrist splint at night without much improvement. No weakness with hand grip.  Past Medical History:  Diagnosis Date  . Arthritis   . Diabetes mellitus    TYPE II  . GERD (gastroesophageal reflux disease)   . Hay fever    ALLERGIES  . Heart murmur    Past Surgical History:  Procedure Laterality Date  . BILATERAL KNEE ARTHROSCOPY     X2  . Neuroplasty Digital Nerve Right 06/21/2013   @ [place]  . SKIN CANCER EXCISION     ON CHEST  . Tarsal Exostectomy Right 06/21/2013   @ [place]    reports that he has never smoked. He has quit using smokeless tobacco. He reports that he does not drink alcohol. His drug history is not on file. family history includes Arthritis in his other; Diabetes in his other; Hypertension in his other. No Known Allergies   Review of Systems  Respiratory: Negative for shortness of breath.   Cardiovascular: Negative for chest pain.  Musculoskeletal: Negative for neck pain.  Skin: Negative for rash.  Neurological: Positive for numbness. Negative for weakness.  Hematological: Negative for adenopathy.       Objective:   Physical Exam  Constitutional: He appears well-developed and well-nourished.  Cardiovascular: Normal  rate and regular rhythm.   Pulmonary/Chest: Effort normal and breath sounds normal. No respiratory distress. He has no wheezes. He has no rales.  Musculoskeletal:  Right upper extremity no edema. No ecchymosis. No erythema. Right elbow full range of motion. No olecranon swelling. He has no tenderness of the medial or lateral epicondylar region. No pain with wrist extension or wrist flexion against resistance. No biceps tenderness. No triceps tenderness. No pain with forearm flexion or extension against resistance  Neurological:  Full strength upper extremities. Trace reflexes upper extremities throughout       Assessment:     Patient's presents with right posterior elbow pain. No evidence for lateral or medial epicondylitis. Does not seem to have any reproducible pain with triceps contraction/forearm extension.  Also complains of some intermittent numbness right first through third digits. Possible carpal tunnel-not improved with wrist splint    Plan:     -We recommended referral to sports medicine for evaluation regarding his right posterior elbow pain  Eulas Post MD  Primary Care at [facility]

## 2016-07-21 NOTE — Progress Notes (Signed)
Pre visit review using our clinic review tool, if applicable. No additional management support is needed unless otherwise documented below in the visit note. 

## 2016-08-04 NOTE — Progress Notes (Signed)
[facility] [address], [place] [address] Phone: [phone] Subjective:    I'm seeing this patient by the request  of:  Eulas Post, MD   CC: Right elbow pain  [id number]  Spencer Duffy is a 51 y.o. male coming in with complaint of Right elbow pain. Patient has had this for quite some time. Describes the pain as a dull, throbbing aching sensation. Patient may be have had this for multiple months. Maybe have hit it against something. Has had lateral epicondylar tendinitis previously but states that this feels different. Even hurts when he lays the elbow on things. Worsening pain at night as well. Denies any numbness in the hand but states that the pain can radiate to his hand from time to time. Denies any neck pain. Continues to stay very active.     Past Medical History:  Diagnosis Date  . Arthritis   . Diabetes mellitus    TYPE II  . GERD (gastroesophageal reflux disease)   . Hay fever    ALLERGIES  . Heart murmur    Past Surgical History:  Procedure Laterality Date  . BILATERAL KNEE ARTHROSCOPY     X2  . Neuroplasty Digital Nerve Right 06/21/2013   @ [place]  . SKIN CANCER EXCISION     ON CHEST  . Tarsal Exostectomy Right 06/21/2013   @ [place]   Social History   Social History  . Marital status: Married    Spouse name: N/A  . Number of children: N/A  . Years of education: N/A   Social History Main Topics  . Smoking status: Never Smoker  . Smokeless tobacco: Former Systems developer  . Alcohol use No  . Drug use: Unknown  . Sexual activity: Not on file   Other Topics Concern  . Not on file   Social History Narrative  . No narrative on file   No Known Allergies Family History  Problem Relation Age of Onset  . Arthritis Other   . Hypertension Other   . Diabetes Other     PARENT, GRANDPARENT    Past medical history, social, surgical and family history all reviewed in electronic medical record.  No pertanent information unless  stated regarding to the chief complaint.   Review of Systems:Review of systems updated and as accurate as of 08/04/16  No headache, visual changes, nausea, vomiting, diarrhea, constipation, dizziness, abdominal pain, skin rash, fevers, chills, night sweats, weight loss, swollen lymph nodes, body aches, joint swelling, muscle aches, chest pain, shortness of breath, mood changes.   Objective  There were no vitals taken for this visit. Systems examined below as of 08/04/16   General: No apparent distress alert and oriented x3 mood and affect normal, dressed appropriately.  HEENT: Pupils equal, extraocular movements intact  Respiratory: Patient's speak in full sentences and does not appear short of breath  Cardiovascular: No lower extremity edema, non tender, no erythema  Skin: Warm  Very dry intact with no signs of infection or rash on extremities or on axial skeleton.  Abdomen: Soft nontender  Neuro: Cranial nerves II through XII are intact, neurovascularly intact in all extremities with 2+ DTRs and 2+ pulses.  Lymph: No lymphadenopathy of posterior or anterior cervical chain or axillae bilaterally.  Gait normal with good balance and coordination.  MSK:  Non tender with full range of motion and good stability and symmetric strength and tone of shoulders, , wrist, hip, knee and ankles bilaterally.  Elbow: Right Unremarkable  to inspection. Range of motion full pronation, supination, flexion, extension. Strength is full to all of the above directions Stable to varus, valgus stress. Negative moving valgus stress test. More tenderness over the lateral joint line ventrally over the lateral epicondylar region Ulnar nerve does not sublux. Negative cubital tunnel Tinel's. Contralateral elbow unremarkable  Musculoskeletal ultrasound was performed and interpreted by Charlann Boxer D.O.   Elbow: Right Lateral epicondyle region does have hypoechoic changes noted. There appears to be an avulsion on the  posterior lateral aspect of the elbow. Radial head unremarkable and located in annular ligament Medial epicondyle and common flexor tendon origin visualized.  No edema, effusions, or avulsions seen. Ulnar nerve in cubital tunnel unremarkable. Olecranon and triceps insertion visualized and unremarkable without edema, effusion, or avulsion.  No signs olecranon bursitis. Power doppler signal normal.  IMPRESSION:  Possible avulsion of the posterior lateral epicondylar region  Procedure note 31540; 15 minutes spent for Therapeutic exercises as stated in above notes.  This included exercises focusing on stretching, strengthening, with significant focus on eccentric aspects. . Flexion and extension exercises working on eccentric's as well as supination and pronation.  Proper technique shown and discussed handout in great detail with ATC.  All questions were discussed and answered.      Impression and Recommendations:     This case required medical decision making of moderate complexity.      Note: This dictation was prepared with Dragon dictation along with smaller phrase technology. Any transcriptional errors that result from this process are unintentional.

## 2016-08-05 ENCOUNTER — Ambulatory Visit: Payer: Self-pay

## 2016-08-05 ENCOUNTER — Ambulatory Visit (INDEPENDENT_AMBULATORY_CARE_PROVIDER_SITE_OTHER): Payer: 59 | Admitting: Family Medicine

## 2016-08-05 ENCOUNTER — Encounter: Payer: Self-pay | Admitting: Family Medicine

## 2016-08-05 VITALS — BP 118/72 | HR 74 | Resp 16 | Wt 195.2 lb

## 2016-08-05 DIAGNOSIS — M25521 Pain in right elbow: Secondary | ICD-10-CM

## 2016-08-05 DIAGNOSIS — S51001A Unspecified open wound of right elbow, initial encounter: Secondary | ICD-10-CM

## 2016-08-05 MED ORDER — VITAMIN D (ERGOCALCIFEROL) 1.25 MG (50000 UNIT) PO CAPS: 50000.0000 [IU] | ORAL_CAPSULE | ORAL | 0 refills | Status: DC

## 2016-08-05 MED ORDER — NITROGLYCERIN 0.2 MG/HR TD PT24: MEDICATED_PATCH | TRANSDERMAL | 1 refills | Status: DC

## 2016-08-05 NOTE — Assessment & Plan Note (Signed)
Patient does have more of an avulsion appears on the lateral epicondylar region. I think that this is somewhat of a lateral epicondylitis that is atypical as well as the supinator. Seems to have nonhealing at this time with the avulsion. Discussed with patient on vitamin D, as well as Dr. glycerin patches. Warned of potential side effects. We discussed icing regimen, home exercises. Patient will wear compression sleeve. Follow-up again in 3-4 weeks

## 2016-08-05 NOTE — Progress Notes (Signed)
Pre-visit discussion using our clinic review tool. No additional management support is needed unless otherwise documented below in the visit note.  

## 2016-08-05 NOTE — Patient Instructions (Signed)
Good to see you.  Ice 20 minutes 2 times daily. Usually after activity and before bed. Exercises 3 times a week.  Once weekly vitamin D  Wear the compression sleeve daily for 1 week then with activity for another 2 weeks.  Possibly try pennsaid pinkie amount topically 2 times daily as needed.    Nitroglycerin Protocol   Apply 1/4 nitroglycerin patch to affected area daily.  Change position of patch within the affected area every 24 hours.  You may experience a headache during the first 1-2 weeks of using the patch, these should subside.  If you experience headaches after beginning nitroglycerin patch treatment, you may take your preferred over the counter pain reliever.  Another side effect of the nitroglycerin patch is skin irritation or rash related to patch adhesive.  Please notify our office if you develop more severe headaches or rash, and stop the patch.  Tendon healing with nitroglycerin patch may require 12 to 24 weeks depending on the extent of injury.  Men should not use if taking Viagra, Cialis, or Levitra.   Do not use if you have migraines or rosacea.  See me again in 3-4 weeks.

## 2016-09-09 NOTE — Progress Notes (Signed)
[facility] [address], [place] [address] Phone: [phone] Subjective:    I'm seeing this patient by the request  of:  Eulas Post, MD   CC: Right elbow pain f/u   [id number]  EDYN QAZI is a 51 y.o. male coming in with complaint of Right elbow pain. Patient was found to have a small avulsion fracture of the lateral epicondylar region.. Patient was to do vitamin D supplementation, surgical history patches, home exercises and compression sleeve. Patient states No significant improvement. Patient has only been doing the exercises intermittently. He is wearing a compression sleeve. No improvement. If anything possibly worsening. Having headaches from the nitroglycerin.     Past Medical History:  Diagnosis Date  . Arthritis   . Diabetes mellitus    TYPE II  . GERD (gastroesophageal reflux disease)   . Hay fever    ALLERGIES  . Heart murmur    Past Surgical History:  Procedure Laterality Date  . BILATERAL KNEE ARTHROSCOPY     X2  . Neuroplasty Digital Nerve Right 06/21/2013   @ [place]  . SKIN CANCER EXCISION     ON CHEST  . Tarsal Exostectomy Right 06/21/2013   @ [place]   Social History   Social History  . Marital status: Married    Spouse name: N/A  . Number of children: N/A  . Years of education: N/A   Social History Main Topics  . Smoking status: Never Smoker  . Smokeless tobacco: Former Systems developer  . Alcohol use No  . Drug use: Unknown  . Sexual activity: Not on file   Other Topics Concern  . Not on file   Social History Narrative  . No narrative on file   No Known Allergies Family History  Problem Relation Age of Onset  . Arthritis Other   . Hypertension Other   . Diabetes Other        PARENT, GRANDPARENT    Past medical history, social, surgical and family history all reviewed in electronic medical record.  No pertanent information unless stated regarding to the chief complaint.   Review of Systems: No visual  changes, nausea, vomiting, diarrhea, constipation, dizziness, abdominal pain, skin rash, fevers, chills, night sweats, weight loss, swollen lymph nodes, body aches, joint swelling,  chest pain, shortness of breath, mood changes.  Positive headaches, muscle aches  Objective  There were no vitals taken for this visit.   Systems examined below as of 09/10/16 General: NAD A&O x3 mood, affect normal  HEENT: Pupils equal, extraocular movements intact no nystagmus Respiratory: not short of breath at rest or with speaking Cardiovascular: No lower extremity edema, non tender Skin: Warm dry intact with no signs of infection or rash on extremities or on axial skeleton. Abdomen: Soft nontender, no masses Neuro: Cranial nerves  intact, neurovascularly intact in all extremities with 2+ DTRs and 2+ pulses. Lymph: No lymphadenopathy appreciated today  Gait normal with good balance and coordination.  MSK: Non tender with full range of motion and good stability and symmetric strength and tone of shoulders,  wrist,  knee hips and ankles bilaterally.   Elbow: Right Mild swelling over the lateral patellar region Range of motion full pronation, supination, flexion, extension. Strength is full to all of the above directions Stable to varus, valgus stress. Negative moving valgus stress test. Potentially increasing mild weakness with wrist extension against resistance Tender to palpation in the lateral epicondylar region. Ulnar nerve does not sublux. Negative  cubital tunnel Tinel's.  Procedure: Real-time Ultrasound Guided Injection of , extensor tendon sheath Device: GE Logiq Q7 Ultrasound guided injection is preferred based studies that show increased duration, increased effect, greater accuracy, decreased procedural pain, increased response rate, and decreased cost with ultrasound guided versus blind injection.  Verbal informed consent obtained.  Time-out conducted.  Noted no overlying erythema, induration,  or other signs of local infection.  Skin prepped in a sterile fashion.  Local anesthesia: Topical Ethyl chloride.  With sterile technique and under real time ultrasound guidance:  With a 25-gauge half-inch needle patient was injected with a total of 0.5 mL of 0.5% Marcaine and 0.5 mL of Kenalog 40 mg/dL. Completed without difficulty  Pain immediately resolved suggesting accurate placement of the medication.  Advised to call if fevers/chills, erythema, induration, drainage, or persistent bleeding.  Images permanently stored and available for review in the ultrasound unit.  Impression: Technically successful ultrasound guided injection.    Impression and Recommendations:     This case required medical decision making of moderate complexity.      Note: This dictation was prepared with Dragon dictation along with smaller phrase technology. Any transcriptional errors that result from this process are unintentional.

## 2016-09-10 ENCOUNTER — Encounter: Payer: Self-pay | Admitting: Family Medicine

## 2016-09-10 ENCOUNTER — Ambulatory Visit: Payer: Self-pay

## 2016-09-10 ENCOUNTER — Ambulatory Visit (INDEPENDENT_AMBULATORY_CARE_PROVIDER_SITE_OTHER): Payer: 59 | Admitting: Family Medicine

## 2016-09-10 VITALS — BP 110/64 | HR 59 | Ht 71.0 in | Wt 199.0 lb

## 2016-09-10 DIAGNOSIS — M25521 Pain in right elbow: Secondary | ICD-10-CM

## 2016-09-10 DIAGNOSIS — M7711 Lateral epicondylitis, right elbow: Secondary | ICD-10-CM | POA: Diagnosis not present

## 2016-09-10 NOTE — Assessment & Plan Note (Addendum)
Worsening symptoms. Patient's was given an injection today. Normal skin resolution of pain immediately. Discussed with patient about a wrist brace that was given to him today to avoid any type of wrist extension. We discussed icing regimen. Encourage patient to start home exercises again and 96 hours and do 3 times a week. We discussed which activities to avoid. Patient is in the process of building a deck and likely will slow down healing. We'll start though possible PRP.

## 2016-09-10 NOTE — Patient Instructions (Addendum)
Good to see you  Wear wrist brace with activity at least  Continue the nitro but try at night, if headaches bad then can stop  Injected you today  Start exercises again 3 times a week.  See me again in 4 weeks and we may need to consider PRP if not making progress.

## 2016-10-12 ENCOUNTER — Encounter: Payer: Self-pay | Admitting: Family Medicine

## 2016-10-12 ENCOUNTER — Ambulatory Visit (INDEPENDENT_AMBULATORY_CARE_PROVIDER_SITE_OTHER): Payer: 59 | Admitting: Family Medicine

## 2016-10-12 DIAGNOSIS — S51001A Unspecified open wound of right elbow, initial encounter: Secondary | ICD-10-CM

## 2016-10-12 NOTE — Patient Instructions (Signed)
Careful around the poison ivy  Ice is your friend.  Increase activity as tolerated.  Lift underhand See me again in 6 weeks just in case.

## 2016-10-12 NOTE — Assessment & Plan Note (Signed)
Doing well at this time. Discussed icing regimen and home exercises. Discussed continuing the conservative therapy and follow-up as needed

## 2016-10-12 NOTE — Progress Notes (Signed)
[facility] [address] [place], [place] [address] Phone: [phone] Subjective:    I'm seeing this patient by the request  of:  Eulas Post, MD   CC: Right elbow pain f/u   [id number]  Spencer Duffy is a 51 y.o. male coming in with complaint of Right elbow pain. Patient was found to have a small avulsion fracture of the lateral epicondylar region. Patient was continued have pain even with conservative therapy. Patient was given an injection. Patient is doing 90% better. Patient states still mild pain. Patient has been doing more repetitive activity building a deck on his cells. Nose that this can be contributing. Doing the exercises intermittently. No longer doing the surgical history patch.    Past Medical History:  Diagnosis Date  . Arthritis   . Diabetes mellitus    TYPE II  . GERD (gastroesophageal reflux disease)   . Hay fever    ALLERGIES  . Heart murmur    Past Surgical History:  Procedure Laterality Date  . BILATERAL KNEE ARTHROSCOPY     X2  . Neuroplasty Digital Nerve Right 06/21/2013   @ [facility]  . SKIN CANCER EXCISION     ON CHEST  . Tarsal Exostectomy Right 06/21/2013   @ [facility]   Social History   Social History  . Marital status: Married    Spouse name: N/A  . Number of children: N/A  . Years of education: N/A   Social History Main Topics  . Smoking status: Never Smoker  . Smokeless tobacco: Former Systems developer  . Alcohol use No  . Drug use: Unknown  . Sexual activity: Not Asked   Other Topics Concern  . None   Social History Narrative  . None   No Known Allergies Family History  Problem Relation Age of Onset  . Arthritis Other   . Hypertension Other   . Diabetes Other        PARENT, GRANDPARENT    Past medical history, social, surgical and family history all reviewed in electronic medical record.  No pertanent information unless stated regarding to the chief complaint.   Review of Systems: No headache, visual  changes, nausea, vomiting, diarrhea, constipation, dizziness, abdominal pain, skin rash, fevers, chills, night sweats, weight loss, swollen lymph nodes, body aches, joint swelling, muscle aches, chest pain, shortness of breath, mood changes.    Objective  Blood pressure 116/78, pulse (!) 58, height 6' (1.829 m), weight 198 lb (89.8 kg).   Systems examined below as of 10/12/16 General: NAD A&O x3 mood, affect normal  HEENT: Pupils equal, extraocular movements intact no nystagmus Respiratory: not short of breath at rest or with speaking Cardiovascular: No lower extremity edema, non tender Skin: Warm dry intact with no signs of infection or rash on extremities or on axial skeleton. Abdomen: Soft nontender, no masses Neuro: Cranial nerves  intact, neurovascularly intact in all extremities with 2+ DTRs and 2+ pulses. Lymph: No lymphadenopathy appreciated today  Gait normal with good balance and coordination.  MSK: Non tender with full range of motion and good stability and symmetric strength and tone of shoulders, wrist,  knee hips and ankles bilaterally.   Elbow: Right Unremarkable to inspection. Range of motion full pronation, supination, flexion, extension. Strength is full to all of the above directions Stable to varus, valgus stress. Negative moving valgus stress test. Mild tenderness still over the lateral epicondylar region. Ulnar nerve does not sublux. Negative cubital tunnel Tinel's. Contralateral elbow unremarkable  Impression and Recommendations:     This case required medical decision making of moderate complexity.      Note: This dictation was prepared with Dragon dictation along with smaller phrase technology. Any transcriptional errors that result from this process are unintentional.

## 2016-10-13 ENCOUNTER — Other Ambulatory Visit: Payer: Self-pay | Admitting: Family Medicine

## 2016-11-22 ENCOUNTER — Encounter: Payer: Self-pay | Admitting: Family Medicine

## 2016-11-22 ENCOUNTER — Ambulatory Visit (INDEPENDENT_AMBULATORY_CARE_PROVIDER_SITE_OTHER): Payer: 59 | Admitting: Family Medicine

## 2016-11-22 DIAGNOSIS — M7711 Lateral epicondylitis, right elbow: Secondary | ICD-10-CM | POA: Diagnosis not present

## 2016-11-22 NOTE — Addendum Note (Signed)
Addended by: Douglass Rivers T on: 11/22/2016 12:41 PM   Modules accepted: Orders

## 2016-11-22 NOTE — Progress Notes (Signed)
[facility] [address] [place], [place] [address] Phone: [phone] Subjective:    I'm seeing this patient by the request  of:  Eulas Post, MD   CC: Right elbow pain f/u   [id number]  Spencer Duffy is a 51 y.o. male coming in with complaint of Right elbow pain. Patient was found to have a small avulsion fracture of the lateral epicondylar region. Patient was doing 90% better after the injection but unfortunately now the pain is worsening again. Patient states that it is affecting daily activities, some mild numbness in the hand, patient states waking up at night. Significant amount of pain that unfortunately possibly worse the ever.     Past Medical History:  Diagnosis Date  . Arthritis   . Diabetes mellitus    TYPE II  . GERD (gastroesophageal reflux disease)   . Hay fever    ALLERGIES  . Heart murmur    Past Surgical History:  Procedure Laterality Date  . BILATERAL KNEE ARTHROSCOPY     X2  . Neuroplasty Digital Nerve Right 06/21/2013   @ [facility]  . SKIN CANCER EXCISION     ON CHEST  . Tarsal Exostectomy Right 06/21/2013   @ [facility]   Social History   Social History  . Marital status: Married    Spouse name: N/A  . Number of children: N/A  . Years of education: N/A   Social History Main Topics  . Smoking status: Never Smoker  . Smokeless tobacco: Former Systems developer  . Alcohol use No  . Drug use: Unknown  . Sexual activity: Not on file   Other Topics Concern  . Not on file   Social History Narrative  . No narrative on file   No Known Allergies Family History  Problem Relation Age of Onset  . Arthritis Other   . Hypertension Other   . Diabetes Other        PARENT, GRANDPARENT    Past medical history, social, surgical and family history all reviewed in electronic medical record.  No pertanent information unless stated regarding to the chief complaint.   Review of Systems: No headache, visual changes, nausea, vomiting, diarrhea,  constipation, dizziness, abdominal pain, skin rash, fevers, chills, night sweats, weight loss, swollen lymph nodes, body aches, joint swelling, muscle aches, chest pain, shortness of breath, mood changes.     Objective  There were no vitals taken for this visit.   Systems examined below as of 11/22/16 General: NAD A&O x3 mood, affect normal  HEENT: Pupils equal, extraocular movements intact no nystagmus Respiratory: not short of breath at rest or with speaking Cardiovascular: No lower extremity edema, non tender Skin: Warm dry intact with no signs of infection or rash on extremities or on axial skeleton. Abdomen: Soft nontender, no masses Neuro: Cranial nerves  intact, neurovascularly intact in all extremities with 2+ DTRs and 2+ pulses. Lymph: No lymphadenopathy appreciated today  Gait normal with good balance and coordination.  MSK: Non tender with full range of motion and good stability and symmetric strength and tone of shoulders,  wrist,  knee hips and ankles bilaterally.   Elbow: Right Mild swelling of the lateral patellar region Range of motion full pronation, supination, flexion, extension. Mild weakness with wrist extension Stable to varus, valgus stress. Negative moving valgus stress test. Worsening tenderness to palpation over the lateral upper condylar region. Ulnar nerve does not sublux. Negative cubital tunnel Tinel's.   Impression and Recommendations:  This case required medical decision making of moderate complexity.      Note: This dictation was prepared with Dragon dictation along with smaller phrase technology. Any transcriptional errors that result from this process are unintentional.

## 2016-11-22 NOTE — Assessment & Plan Note (Signed)
Patient is having weakness and this time. Has failed all conservative therapy including nitroglycerin, injection, formal physical therapy. At this time I feel that MRI is necessary. Been going on greater than 3 months as well. Depending on findings this can help [place] no patient needs surgical intervention. Patient would be willing to do that. Follow-up again after the MRI.

## 2016-11-23 ENCOUNTER — Encounter: Payer: Self-pay | Admitting: Family Medicine

## 2016-11-23 MED ORDER — DIAZEPAM 5 MG PO TABS: ORAL_TABLET | ORAL | 0 refills | Status: DC

## 2016-11-29 ENCOUNTER — Ambulatory Visit
Admission: RE | Admit: 2016-11-29 | Discharge: 2016-11-29 | Disposition: A | Discharge status: Home / Self Care | Payer: 59 | Source: Ambulatory Visit | Attending: Family Medicine | Admitting: Family Medicine

## 2016-11-29 ENCOUNTER — Encounter: Payer: Self-pay | Admitting: Family Medicine

## 2016-11-29 DIAGNOSIS — M7711 Lateral epicondylitis, right elbow: Secondary | ICD-10-CM

## 2016-11-29 DIAGNOSIS — M7989 Other specified soft tissue disorders: Secondary | ICD-10-CM | POA: Diagnosis not present

## 2016-12-01 ENCOUNTER — Other Ambulatory Visit: Payer: Self-pay | Admitting: *Deleted

## 2016-12-01 ENCOUNTER — Ambulatory Visit (INDEPENDENT_AMBULATORY_CARE_PROVIDER_SITE_OTHER): Payer: 59 | Admitting: Family Medicine

## 2016-12-01 ENCOUNTER — Encounter: Payer: Self-pay | Admitting: Family Medicine

## 2016-12-01 VITALS — BP 108/64 | HR 70 | Temp 98.1°F | Ht 73.5 in | Wt 198.4 lb

## 2016-12-01 DIAGNOSIS — M7711 Lateral epicondylitis, right elbow: Secondary | ICD-10-CM

## 2016-12-01 DIAGNOSIS — Z0000 Encounter for general adult medical examination without abnormal findings: Secondary | ICD-10-CM | POA: Diagnosis not present

## 2016-12-01 LAB — HEPATIC FUNCTION PANEL
ALT: 16 U/L (ref 0–53)
AST: 13 U/L (ref 0–37)
Albumin: 4.7 g/dL (ref 3.5–5.2)
Alkaline Phosphatase: 72 U/L (ref 39–117)
BILIRUBIN DIRECT: 0.1 mg/dL (ref 0.0–0.3)
BILIRUBIN TOTAL: 0.5 mg/dL (ref 0.2–1.2)
Total Protein: 6.5 g/dL (ref 6.0–8.3)

## 2016-12-01 LAB — CBC WITH DIFFERENTIAL/PLATELET
BASOS ABS: 0 10*3/uL (ref 0.0–0.1)
Basophils Relative: 0.4 % (ref 0.0–3.0)
EOS ABS: 0.1 10*3/uL (ref 0.0–0.7)
Eosinophils Relative: 1 % (ref 0.0–5.0)
HEMATOCRIT: 42.1 % (ref 39.0–52.0)
Hemoglobin: 13.9 g/dL (ref 13.0–17.0)
LYMPHS ABS: 1.5 10*3/uL (ref 0.7–4.0)
Lymphocytes Relative: 28.5 % (ref 12.0–46.0)
MCHC: 33 g/dL (ref 30.0–36.0)
MCV: 92.9 fl (ref 78.0–100.0)
MONO ABS: 0.3 10*3/uL (ref 0.1–1.0)
Monocytes Relative: 5.7 % (ref 3.0–12.0)
NEUTROS ABS: 3.5 10*3/uL (ref 1.4–7.7)
NEUTROS PCT: 64.4 % (ref 43.0–77.0)
PLATELETS: 285 10*3/uL (ref 150.0–400.0)
RBC: 4.53 Mil/uL (ref 4.22–5.81)
RDW: 12.9 % (ref 11.5–15.5)
WBC: 5.4 10*3/uL (ref 4.0–10.5)

## 2016-12-01 LAB — LIPID PANEL
CHOL/HDL RATIO: 4
Cholesterol: 169 mg/dL (ref 0–200)
HDL: 47.6 mg/dL (ref >39.00)
LDL Cholesterol: 109 mg/dL — ABNORMAL HIGH (ref 0–99)
NonHDL: 121.5
TRIGLYCERIDES: 62 mg/dL (ref 0.0–149.0)
VLDL: 12.4 mg/dL (ref 0.0–40.0)

## 2016-12-01 LAB — MICROALBUMIN / CREATININE URINE RATIO
CREATININE, U: 22.6 mg/dL
Microalb Creat Ratio: 3.1 mg/g (ref 0.0–30.0)

## 2016-12-01 LAB — BASIC METABOLIC PANEL
BUN: 21 mg/dL (ref 6–23)
CHLORIDE: 101 meq/L (ref 96–112)
CO2: 31 meq/L (ref 19–32)
CREATININE: 0.85 mg/dL (ref 0.40–1.50)
Calcium: 9.4 mg/dL (ref 8.4–10.5)
GFR: 101.41 mL/min (ref >60.00)
Glucose, Bld: 126 mg/dL — ABNORMAL HIGH (ref 70–99)
Potassium: 4.5 mEq/L (ref 3.5–5.1)
Sodium: 136 mEq/L (ref 135–145)

## 2016-12-01 LAB — HEMOGLOBIN A1C: Hgb A1c MFr Bld: 7.2 % — ABNORMAL HIGH (ref 4.6–6.5)

## 2016-12-01 LAB — TSH: TSH: 1.65 u[IU]/mL (ref 0.35–4.50)

## 2016-12-01 LAB — PSA: PSA: 0.32 ng/mL (ref 0.10–4.00)

## 2016-12-01 NOTE — Patient Instructions (Signed)
We will set up colonoscopy  

## 2016-12-01 NOTE — Progress Notes (Signed)
Subjective:     Patient ID: Spencer Duffy, male   DOB: 1965-07-21, 51 y.o.   MRN: [id number]  HPI Patient seen for complete physical. He's had some ongoing issues with right elbow pain and is looking at possible surgery for resistant right lateral epicondylitis. Has not improved with multiple conservative therapies. He has type 2 diabetes which has been well controlled for several years. No complications. Still exercises though somewhat inconsistently. Just turned 51 two days ago. Blood pressures been stable. Blood sugar stable.  Past Medical History:  Diagnosis Date  . Arthritis   . Diabetes mellitus    TYPE II  . GERD (gastroesophageal reflux disease)   . Hay fever    ALLERGIES  . Heart murmur    Past Surgical History:  Procedure Laterality Date  . BILATERAL KNEE ARTHROSCOPY     X2  . Neuroplasty Digital Nerve Right 06/21/2013   @ [facility]  . SKIN CANCER EXCISION     ON CHEST  . Tarsal Exostectomy Right 06/21/2013   @ [facility]    reports that he has never smoked. He has quit using smokeless tobacco. He reports that he does not drink alcohol. His drug history is not on file. family history includes Arthritis in his other; Diabetes in his other; Hypertension in his other. No Known Allergies   Review of Systems  Constitutional: Negative for activity change, appetite change, fatigue and fever.  HENT: Negative for congestion, ear pain and trouble swallowing.   Eyes: Negative for pain and visual disturbance.  Respiratory: Negative for cough, shortness of breath and wheezing.   Cardiovascular: Negative for chest pain and palpitations.  Gastrointestinal: Negative for abdominal distention, abdominal pain, blood in stool, constipation, diarrhea, nausea, rectal pain and vomiting.  Genitourinary: Negative for dysuria, hematuria and testicular pain.  Musculoskeletal: Negative for arthralgias and joint swelling.  Skin: Negative for rash.  Neurological: Negative for dizziness, syncope and headaches.   Hematological: Negative for adenopathy.  Psychiatric/Behavioral: Negative for confusion and dysphoric mood.       Objective:   Physical Exam  Constitutional: He is oriented to person, place, and time. He appears well-developed and well-nourished. No distress.  HENT:  Head: Normocephalic and atraumatic.  Right Ear: External ear normal.  Left Ear: External ear normal.  Mouth/Throat: Oropharynx is clear and moist.  Eyes: Pupils are equal, round, and reactive to light. Conjunctivae and EOM are normal.  Neck: Normal range of motion. Neck supple. No thyromegaly present.  Cardiovascular: Normal rate, regular rhythm and normal heart sounds.   No murmur heard. Pulmonary/Chest: No respiratory distress. He has no wheezes. He has no rales.  Abdominal: Soft. Bowel sounds are normal. He exhibits no distension and no mass. There is no tenderness. There is no rebound and no guarding.  Musculoskeletal: He exhibits no edema.  Lymphadenopathy:    He has no cervical adenopathy.  Neurological: He is alert and oriented to person, place, and time. He displays normal reflexes. No cranial nerve deficit.  Skin: No rash noted.  Psychiatric: He has a normal mood and affect.       Assessment:     Physical exam. Patient has type 2 diabetes which has been well controlled    Plan:     -Set up screening lab work. Include PSA after full discussion of risk and benefits along with urine microalbumin and A1c -Set up colonoscopy -Continue regular exercise habits -Routine follow-up in 6 months and sooner as needed  Eulas Post MD [facility]  at [facility]

## 2016-12-03 DIAGNOSIS — M7711 Lateral epicondylitis, right elbow: Secondary | ICD-10-CM | POA: Diagnosis not present

## 2016-12-21 DIAGNOSIS — M7711 Lateral epicondylitis, right elbow: Secondary | ICD-10-CM | POA: Diagnosis not present

## 2016-12-22 HISTORY — PX: ELBOW ARTHROSCOPY WITH TENDON RECONSTRUCTION: SHX5616

## 2017-01-06 ENCOUNTER — Encounter: Payer: Self-pay | Admitting: Family Medicine

## 2017-01-11 ENCOUNTER — Other Ambulatory Visit: Payer: Self-pay | Admitting: Family Medicine

## 2017-01-14 DIAGNOSIS — H524 Presbyopia: Secondary | ICD-10-CM | POA: Diagnosis not present

## 2017-01-14 DIAGNOSIS — H5213 Myopia, bilateral: Secondary | ICD-10-CM | POA: Diagnosis not present

## 2017-01-14 DIAGNOSIS — E119 Type 2 diabetes mellitus without complications: Secondary | ICD-10-CM | POA: Diagnosis not present

## 2017-01-14 LAB — HM DIABETES EYE EXAM

## 2017-02-02 ENCOUNTER — Encounter: Payer: Self-pay | Admitting: Internal Medicine

## 2017-02-08 DIAGNOSIS — Z23 Encounter for immunization: Secondary | ICD-10-CM | POA: Diagnosis not present

## 2017-02-08 DIAGNOSIS — M7711 Lateral epicondylitis, right elbow: Secondary | ICD-10-CM | POA: Diagnosis not present

## 2017-02-15 ENCOUNTER — Encounter: Payer: Self-pay | Admitting: Family Medicine

## 2017-02-21 DIAGNOSIS — M7711 Lateral epicondylitis, right elbow: Secondary | ICD-10-CM | POA: Diagnosis not present

## 2017-02-23 DIAGNOSIS — M7711 Lateral epicondylitis, right elbow: Secondary | ICD-10-CM | POA: Diagnosis not present

## 2017-02-28 DIAGNOSIS — M7711 Lateral epicondylitis, right elbow: Secondary | ICD-10-CM | POA: Diagnosis not present

## 2017-03-07 DIAGNOSIS — M7711 Lateral epicondylitis, right elbow: Secondary | ICD-10-CM | POA: Diagnosis not present

## 2017-03-09 DIAGNOSIS — M7711 Lateral epicondylitis, right elbow: Secondary | ICD-10-CM | POA: Diagnosis not present

## 2017-03-14 DIAGNOSIS — M7711 Lateral epicondylitis, right elbow: Secondary | ICD-10-CM | POA: Diagnosis not present

## 2017-03-15 ENCOUNTER — Other Ambulatory Visit: Payer: Self-pay | Admitting: Family Medicine

## 2017-03-16 ENCOUNTER — Other Ambulatory Visit: Payer: Self-pay

## 2017-03-16 ENCOUNTER — Ambulatory Visit (AMBULATORY_SURGERY_CENTER): Payer: Self-pay | Admitting: *Deleted

## 2017-03-16 VITALS — Ht 72.0 in | Wt 200.0 lb

## 2017-03-16 DIAGNOSIS — Z1211 Encounter for screening for malignant neoplasm of colon: Secondary | ICD-10-CM

## 2017-03-16 NOTE — Progress Notes (Signed)
Patient denies any allergies to eggs or soy. Patient denies any problems with anesthesia/sedation. Patient denies any oxygen use at home. Patient denies taking any diet/weight loss medications or blood thinners. EMMI education assisgned to patient on colonoscopy, this was explained and instructions given to patient. 

## 2017-03-17 ENCOUNTER — Encounter: Payer: Self-pay | Admitting: Internal Medicine

## 2017-03-30 ENCOUNTER — Encounter: Payer: Self-pay | Admitting: Internal Medicine

## 2017-03-30 ENCOUNTER — Other Ambulatory Visit: Payer: Self-pay

## 2017-03-30 ENCOUNTER — Ambulatory Visit (AMBULATORY_SURGERY_CENTER): Payer: 59 | Admitting: Internal Medicine

## 2017-03-30 VITALS — BP 106/72 | HR 69 | Temp 98.0°F | Resp 20 | Ht 72.0 in | Wt 200.0 lb

## 2017-03-30 DIAGNOSIS — Z1211 Encounter for screening for malignant neoplasm of colon: Secondary | ICD-10-CM

## 2017-03-30 MED ORDER — SODIUM CHLORIDE 0.9 % IV SOLN: 500.0000 mL | Freq: Once | INTRAVENOUS | Status: DC

## 2017-03-30 NOTE — Progress Notes (Signed)
A and O x3. Report to RN. Tolerated MAC anesthesia well.

## 2017-03-30 NOTE — Op Note (Signed)
[facility] Patient Name: Spencer Duffy Procedure Date: 03/30/2017 9:41 AM MRN: [id number] Endoscopist: Gatha Mayer , MD Age: 51 Referring MD:  Date of Birth: 1966-02-22 Gender: Male Account #: [id number] Procedure:                Colonoscopy Indications:              Screening for colorectal malignant neoplasm, This                            is the patient's first colonoscopy Medicines:                Propofol per Anesthesia, Monitored Anesthesia Care Procedure:                Pre-Anesthesia Assessment:                           - Prior to the procedure, a History and Physical                            was performed, and patient medications and                            allergies were reviewed. The patient's tolerance of                            previous anesthesia was also reviewed. The risks                            and benefits of the procedure and the sedation                            options and risks were discussed with the patient.                            All questions were answered, and informed consent                            was obtained. Prior Anticoagulants: The patient has                            taken no previous anticoagulant or antiplatelet                            agents. ASA Grade Assessment: II - A patient with                            mild systemic disease. After reviewing the risks                            and benefits, the patient was deemed in                            satisfactory condition to undergo the procedure.  After obtaining informed consent, the colonoscope                            was passed under direct vision. Throughout the                            procedure, the patient's blood pressure, pulse, and                            oxygen saturations were monitored continuously. The                            Model PCF-H190DL [id number]) scope was introduced                             through the anus and advanced to the the cecum,                            identified by appendiceal orifice and ileocecal                            valve. The quality of the bowel preparation was                            excellent. The colonoscopy was performed without                            difficulty. The patient tolerated the procedure                            well. The bowel preparation used was Miralax. The                            ileocecal valve, appendiceal orifice, and rectum                            were photographed. Scope In: 9:49:04 AM Scope Out: 10:04:17 AM Scope Withdrawal Time: 0 hours 12 minutes 0 seconds  Total Procedure Duration: 0 hours 15 minutes 13 seconds  Findings:                 The perianal and digital rectal examinations were                            normal. Pertinent negatives include normal prostate                            (size, shape, and consistency).                           The colon (entire examined portion) appeared normal.                           No additional abnormalities were found on  retroflexion. Complications:            No immediate complications. Estimated blood loss:                            None. Estimated Blood Loss:     Estimated blood loss: none. Recommendation:           - Repeat colonoscopy in 10 years for screening                            purposes.                           - Patient has a contact number available for                            emergencies. The signs and symptoms of potential                            delayed complications were discussed with the                            patient. Return to normal activities tomorrow.                            Written discharge instructions were provided to the                            patient.                           - Resume previous diet.                           - Continue present medications. Gatha Mayer,  MD 03/30/2017 10:10:09 AM This report has been signed electronically.

## 2017-03-30 NOTE — Patient Instructions (Addendum)
   The colonoscopy was normal.  Next routine colonoscopy or other screening test in 10 years - 2028  I appreciate the opportunity to care for you. Gatha Mayer, MD, FACG YOU HAD AN ENDOSCOPIC PROCEDURE TODAY AT [facility]:   Refer to the procedure report that was given to you for any specific questions about what was found during the examination.  If the procedure report does not answer your questions, please call your gastroenterologist to clarify.  If you requested that your care partner not be given the details of your procedure findings, then the procedure report has been included in a sealed envelope for you to review at your convenience later.  YOU SHOULD EXPECT: Some feelings of bloating in the abdomen. Passage of more gas than usual.  Walking can help get rid of the air that was put into your GI tract during the procedure and reduce the bloating. If you had a lower endoscopy (such as a colonoscopy or flexible sigmoidoscopy) you may notice spotting of blood in your stool or on the toilet paper. If you underwent a bowel prep for your procedure, you may not have a normal bowel movement for a few days.  Please Note:  You might notice some irritation and congestion in your nose or some drainage.  This is from the oxygen used during your procedure.  There is no need for concern and it should clear up in a day or so.  SYMPTOMS TO REPORT IMMEDIATELY:   Following lower endoscopy (colonoscopy or flexible sigmoidoscopy):  Excessive amounts of blood in the stool  Significant tenderness or worsening of abdominal pains  Swelling of the abdomen that is new, acute  Fever of 100F or higher  For urgent or emergent issues, a gastroenterologist can be reached at any hour by calling [phone].   DIET:  We do recommend a small meal at first, but then you may proceed to your regular diet.  Drink plenty of fluids but you should avoid alcoholic beverages for 24  hours.  ACTIVITY:  You should plan to take it easy for the rest of today and you should NOT DRIVE or use heavy machinery until tomorrow (because of the sedation medicines used during the test).    FOLLOW UP: Our staff will call the number listed on your records the next business day following your procedure to check on you and address any questions or concerns that you may have regarding the information given to you following your procedure. If we do not reach you, we will leave a message.  However, if you are feeling well and you are not experiencing any problems, there is no need to return our call.  We will assume that you have returned to your regular daily activities without incident.  If any biopsies were taken you will be contacted by phone or by letter within the next 1-3 weeks.  Please call us at [phone] if you have not heard about the biopsies in 3 weeks.   Repeat Colonoscopy screening in 10 years  SIGNATURES/CONFIDENTIALITY: You and/or your care partner have signed paperwork which will be entered into your electronic medical record.  These signatures attest to the fact that that the information above on your After Visit Summary has been reviewed and is understood.  Full responsibility of the confidentiality of this discharge information lies with you and/or your care-partner.

## 2017-03-30 NOTE — Progress Notes (Signed)
Pt's states no medical or surgical changes since previsit or office visit. 

## 2017-03-31 ENCOUNTER — Telehealth: Payer: Self-pay | Admitting: *Deleted

## 2017-03-31 NOTE — Telephone Encounter (Signed)
  Follow up Call-  Call back number 03/30/2017  Post procedure Call Back phone  # [phone]  Permission to leave phone message Yes  Some recent data might be hidden     Patient questions:  Do you have a fever, pain , or abdominal swelling? No. Pain Score  0 *  Have you tolerated food without any problems? Yes.    Have you been able to return to your normal activities? Yes.    Do you have any questions about your discharge instructions: Diet   No. Medications  No. Follow up visit  No.  Do you have questions or concerns about your Care? No.  Actions: * If pain score is 4 or above: No action needed, pain <4.

## 2017-03-31 NOTE — Telephone Encounter (Addendum)
  Follow up Call-  Call back number 03/30/2017  Post procedure Call Back phone  # [phone]  Permission to leave phone message Yes  Some recent data might be hidden     Patient questions:  Message left to call us if necessary.

## 2017-05-19 DIAGNOSIS — S52571A Other intraarticular fracture of lower end of right radius, initial encounter for closed fracture: Secondary | ICD-10-CM | POA: Diagnosis not present

## 2017-05-30 ENCOUNTER — Other Ambulatory Visit: Payer: Self-pay | Admitting: Family Medicine

## 2017-05-30 NOTE — Telephone Encounter (Signed)
Refill once. He does not take this regularly but mostly with travel

## 2017-05-30 NOTE — Telephone Encounter (Signed)
Last refill 07/21/16 and last office visit 12/01/16.  Okay to fill?

## 2017-06-09 DIAGNOSIS — R5383 Other fatigue: Secondary | ICD-10-CM | POA: Diagnosis not present

## 2017-06-09 DIAGNOSIS — J00 Acute nasopharyngitis [common cold]: Secondary | ICD-10-CM | POA: Diagnosis not present

## 2017-06-20 DIAGNOSIS — S52571A Other intraarticular fracture of lower end of right radius, initial encounter for closed fracture: Secondary | ICD-10-CM | POA: Diagnosis not present

## 2017-07-18 DIAGNOSIS — S52571D Other intraarticular fracture of lower end of right radius, subsequent encounter for closed fracture with routine healing: Secondary | ICD-10-CM | POA: Diagnosis not present

## 2017-07-25 DIAGNOSIS — M25631 Stiffness of right wrist, not elsewhere classified: Secondary | ICD-10-CM | POA: Diagnosis not present

## 2017-07-25 DIAGNOSIS — S52501D Unspecified fracture of the lower end of right radius, subsequent encounter for closed fracture with routine healing: Secondary | ICD-10-CM | POA: Diagnosis not present

## 2017-07-25 DIAGNOSIS — M25531 Pain in right wrist: Secondary | ICD-10-CM | POA: Diagnosis not present

## 2017-07-29 DIAGNOSIS — M25632 Stiffness of left wrist, not elsewhere classified: Secondary | ICD-10-CM | POA: Diagnosis not present

## 2017-07-29 DIAGNOSIS — S52501D Unspecified fracture of the lower end of right radius, subsequent encounter for closed fracture with routine healing: Secondary | ICD-10-CM | POA: Diagnosis not present

## 2017-07-29 DIAGNOSIS — M25531 Pain in right wrist: Secondary | ICD-10-CM | POA: Diagnosis not present

## 2017-08-09 DIAGNOSIS — M25531 Pain in right wrist: Secondary | ICD-10-CM | POA: Diagnosis not present

## 2017-08-09 DIAGNOSIS — S52501D Unspecified fracture of the lower end of right radius, subsequent encounter for closed fracture with routine healing: Secondary | ICD-10-CM | POA: Diagnosis not present

## 2017-08-09 DIAGNOSIS — M25631 Stiffness of right wrist, not elsewhere classified: Secondary | ICD-10-CM | POA: Diagnosis not present

## 2017-08-11 DIAGNOSIS — S52501D Unspecified fracture of the lower end of right radius, subsequent encounter for closed fracture with routine healing: Secondary | ICD-10-CM | POA: Diagnosis not present

## 2017-08-11 DIAGNOSIS — M25531 Pain in right wrist: Secondary | ICD-10-CM | POA: Diagnosis not present

## 2017-08-11 DIAGNOSIS — M25631 Stiffness of right wrist, not elsewhere classified: Secondary | ICD-10-CM | POA: Diagnosis not present

## 2017-08-19 DIAGNOSIS — M25531 Pain in right wrist: Secondary | ICD-10-CM | POA: Diagnosis not present

## 2017-08-19 DIAGNOSIS — S52501D Unspecified fracture of the lower end of right radius, subsequent encounter for closed fracture with routine healing: Secondary | ICD-10-CM | POA: Diagnosis not present

## 2017-08-19 DIAGNOSIS — M25631 Stiffness of right wrist, not elsewhere classified: Secondary | ICD-10-CM | POA: Diagnosis not present

## 2017-08-22 DIAGNOSIS — M25631 Stiffness of right wrist, not elsewhere classified: Secondary | ICD-10-CM | POA: Diagnosis not present

## 2017-08-22 DIAGNOSIS — S52501D Unspecified fracture of the lower end of right radius, subsequent encounter for closed fracture with routine healing: Secondary | ICD-10-CM | POA: Diagnosis not present

## 2017-08-22 DIAGNOSIS — M25531 Pain in right wrist: Secondary | ICD-10-CM | POA: Diagnosis not present

## 2017-08-25 DIAGNOSIS — S52501D Unspecified fracture of the lower end of right radius, subsequent encounter for closed fracture with routine healing: Secondary | ICD-10-CM | POA: Diagnosis not present

## 2017-08-25 DIAGNOSIS — M25631 Stiffness of right wrist, not elsewhere classified: Secondary | ICD-10-CM | POA: Diagnosis not present

## 2017-08-25 DIAGNOSIS — M25531 Pain in right wrist: Secondary | ICD-10-CM | POA: Diagnosis not present

## 2017-08-29 DIAGNOSIS — R2231 Localized swelling, mass and lump, right upper limb: Secondary | ICD-10-CM | POA: Diagnosis not present

## 2017-08-29 DIAGNOSIS — M25631 Stiffness of right wrist, not elsewhere classified: Secondary | ICD-10-CM | POA: Diagnosis not present

## 2017-08-29 DIAGNOSIS — M25531 Pain in right wrist: Secondary | ICD-10-CM | POA: Diagnosis not present

## 2017-08-29 DIAGNOSIS — S52571D Other intraarticular fracture of lower end of right radius, subsequent encounter for closed fracture with routine healing: Secondary | ICD-10-CM | POA: Diagnosis not present

## 2017-08-29 DIAGNOSIS — S52501D Unspecified fracture of the lower end of right radius, subsequent encounter for closed fracture with routine healing: Secondary | ICD-10-CM | POA: Diagnosis not present

## 2017-09-05 ENCOUNTER — Other Ambulatory Visit: Payer: Self-pay | Admitting: Orthopedic Surgery

## 2017-09-05 DIAGNOSIS — S52599A Other fractures of lower end of unspecified radius, initial encounter for closed fracture: Secondary | ICD-10-CM

## 2017-09-11 ENCOUNTER — Ambulatory Visit
Admission: RE | Admit: 2017-09-11 | Discharge: 2017-09-11 | Disposition: A | Discharge status: Home / Self Care | Payer: 59 | Source: Ambulatory Visit | Attending: Orthopedic Surgery | Admitting: Orthopedic Surgery

## 2017-09-11 DIAGNOSIS — M65841 Other synovitis and tenosynovitis, right hand: Secondary | ICD-10-CM | POA: Diagnosis not present

## 2017-09-11 DIAGNOSIS — S52599A Other fractures of lower end of unspecified radius, initial encounter for closed fracture: Secondary | ICD-10-CM

## 2017-09-21 ENCOUNTER — Ambulatory Visit (INDEPENDENT_AMBULATORY_CARE_PROVIDER_SITE_OTHER): Payer: 59 | Admitting: Family Medicine

## 2017-09-21 ENCOUNTER — Encounter: Payer: Self-pay | Admitting: Family Medicine

## 2017-09-21 ENCOUNTER — Ambulatory Visit (INDEPENDENT_AMBULATORY_CARE_PROVIDER_SITE_OTHER): Payer: 59

## 2017-09-21 VITALS — BP 102/64 | HR 64 | Temp 97.9°F | Wt 206.8 lb

## 2017-09-21 DIAGNOSIS — M79671 Pain in right foot: Secondary | ICD-10-CM

## 2017-09-21 DIAGNOSIS — E119 Type 2 diabetes mellitus without complications: Secondary | ICD-10-CM | POA: Diagnosis not present

## 2017-09-21 LAB — POCT GLYCOSYLATED HEMOGLOBIN (HGB A1C): Hemoglobin A1C: 6.6 % — AB (ref 4.0–5.6)

## 2017-09-21 MED ORDER — ESZOPICLONE 3 MG PO TABS: ORAL_TABLET | ORAL | 0 refills | Status: DC

## 2017-09-21 NOTE — Progress Notes (Signed)
  Subjective:     Patient ID: Spencer Duffy, male   DOB: March 29, 1966, 52 y.o.   MRN: [id number]  HPI Patient seen for the following issues  Follow-up type 2 diabetes. Last A1c 7.2%. He's gained some weight and had several recent orthopedic challenges. He had surgery on his right elbow and subsequent tree injury resulting in right distal radial fracture. Had some ongoing wrist problems since then. He had recent MRI of the right wrist and has schedule follow-up orthopedist next week.  Diabetes medications reviewed and compliant with all.  Patient has acute issue of right foot pain. He states about 3 weeks ago he had a three-quarter inch plywood board which he estimated weight 65-70 pounds which slipped and fell onto the dorsum of the right foot. Had some pain involving the right first distal metatarsal since then. Some mild bruising afterwards. Had some pain with ambulation since then. This has inhibited his running.  No breaks in the skin noted.  Past Medical History:  Diagnosis Date  . Arthritis   . Diabetes mellitus    TYPE II  . GERD (gastroesophageal reflux disease)   . Hay fever    ALLERGIES  . Heart murmur    Past Surgical History:  Procedure Laterality Date  . BILATERAL KNEE ARTHROSCOPY     X2  . ELBOW ARTHROSCOPY WITH TENDON RECONSTRUCTION Right 12/22/2016  . Neuroplasty Digital Nerve Right 06/21/2013   @ [place]  . ROTATOR CUFF REPAIR Bilateral   . SKIN CANCER EXCISION     ON CHEST  . Tarsal Exostectomy Right 06/21/2013   @ [place]    reports that he has never smoked. He has quit using smokeless tobacco. He reports that he drinks alcohol. He reports that he does not use drugs. family history includes Arthritis in his other; Colon cancer in his paternal grandmother; Diabetes in his brother, maternal grandmother, other, paternal grandmother, and sister; Head & neck cancer in his brother; Heart attack in his father; Hypertension in his other; Lung cancer in his father; Stroke in his paternal  grandfather. No Known Allergies   Review of Systems  Constitutional: Negative for appetite change and unexpected weight change.  Respiratory: Negative for shortness of breath.   Cardiovascular: Negative for chest pain.  Endocrine: Negative for polydipsia and polyuria.       Objective:   Physical Exam  Constitutional: He appears well-developed and well-nourished.  Cardiovascular: Normal rate and regular rhythm.  Pulmonary/Chest: Effort normal and breath sounds normal. No stridor. No respiratory distress. He has no wheezes. He has no rales.  Musculoskeletal: He exhibits no edema.  Right foot reveals tenderness distal right first metatarsal. He has mild pain with flexion and extension of the right great toe. No visible bruising. No erythema. No other metatarsal tenderness. Full range of motion ankle       Assessment:     #1 type 2 diabetes well controlled with A1c 6.6%  #2 right foot pain following injury as above. Suspect contusion. Rule out fracture    Plan:     -X-ray right foot-if negative recommend comfortable shoe wear and preferably find a shoe with some increased sole support.. -Patient will schedule physical some time after middle of August  Eulas Post MD [facility] at [facility]

## 2017-09-22 ENCOUNTER — Encounter: Payer: Self-pay | Admitting: Family Medicine

## 2017-09-29 DIAGNOSIS — M778 Other enthesopathies, not elsewhere classified: Secondary | ICD-10-CM | POA: Diagnosis not present

## 2017-10-08 ENCOUNTER — Other Ambulatory Visit: Payer: Self-pay | Admitting: Family Medicine

## 2017-10-31 DIAGNOSIS — M778 Other enthesopathies, not elsewhere classified: Secondary | ICD-10-CM | POA: Diagnosis not present

## 2017-11-07 DIAGNOSIS — D2361 Other benign neoplasm of skin of right upper limb, including shoulder: Secondary | ICD-10-CM | POA: Diagnosis not present

## 2017-11-07 DIAGNOSIS — Z85828 Personal history of other malignant neoplasm of skin: Secondary | ICD-10-CM | POA: Diagnosis not present

## 2017-11-07 DIAGNOSIS — L821 Other seborrheic keratosis: Secondary | ICD-10-CM | POA: Diagnosis not present

## 2017-11-07 DIAGNOSIS — L570 Actinic keratosis: Secondary | ICD-10-CM | POA: Diagnosis not present

## 2017-11-11 DIAGNOSIS — H66002 Acute suppurative otitis media without spontaneous rupture of ear drum, left ear: Secondary | ICD-10-CM | POA: Diagnosis not present

## 2017-11-21 DIAGNOSIS — H6500 Acute serous otitis media, unspecified ear: Secondary | ICD-10-CM | POA: Diagnosis not present

## 2018-01-16 DIAGNOSIS — E119 Type 2 diabetes mellitus without complications: Secondary | ICD-10-CM | POA: Diagnosis not present

## 2018-01-16 LAB — HM DIABETES EYE EXAM

## 2018-01-18 ENCOUNTER — Encounter: Payer: Self-pay | Admitting: Family Medicine

## 2018-01-24 ENCOUNTER — Other Ambulatory Visit: Payer: Self-pay | Admitting: Family Medicine

## 2018-02-17 DIAGNOSIS — L820 Inflamed seborrheic keratosis: Secondary | ICD-10-CM | POA: Diagnosis not present

## 2018-02-27 ENCOUNTER — Other Ambulatory Visit: Payer: Self-pay | Admitting: Family Medicine

## 2018-03-12 ENCOUNTER — Other Ambulatory Visit: Payer: Self-pay | Admitting: Family Medicine

## 2018-03-13 NOTE — Telephone Encounter (Signed)
May refill once. 

## 2018-03-13 NOTE — Telephone Encounter (Signed)
Last refill given at office visit 09/21/17. Okay to refill?

## 2018-04-07 ENCOUNTER — Encounter: Payer: Self-pay | Admitting: Family Medicine

## 2018-04-07 ENCOUNTER — Other Ambulatory Visit: Payer: Self-pay

## 2018-04-07 ENCOUNTER — Ambulatory Visit (INDEPENDENT_AMBULATORY_CARE_PROVIDER_SITE_OTHER): Payer: 59 | Admitting: Family Medicine

## 2018-04-07 VITALS — BP 118/70 | HR 68 | Temp 97.8°F | Ht 68.75 in | Wt 206.4 lb

## 2018-04-07 DIAGNOSIS — Z23 Encounter for immunization: Secondary | ICD-10-CM | POA: Diagnosis not present

## 2018-04-07 DIAGNOSIS — Z0000 Encounter for general adult medical examination without abnormal findings: Secondary | ICD-10-CM | POA: Diagnosis not present

## 2018-04-07 NOTE — Patient Instructions (Signed)
Consider shingles vaccine (Shingrix) and check on insurance coverage

## 2018-04-07 NOTE — Progress Notes (Signed)
Subjective:     Patient ID: Spencer Duffy, male   DOB: 24-Mar-1966, 52 y.o.   MRN: [id number]  HPI Patient is seen for physical exam.  He has type 2 diabetes which has been well controlled.  He had some recent problems with right foot pain.  We obtain x-rays which show some mild degenerative changes.  He has not noted any significant redness or warmth to suggest gout.  Involvement is right metatarsophalangeal joint.  He is overdue for labs.  The following health maintenance issues were addressed  -Colonoscopy 2018 -Tetanus 2013 -Diabetic eye exam September 2019 -Flu vaccine today -Previous Pneumovax 2014 -No history of shingles vaccine  Past Medical History:  Diagnosis Date  . Arthritis   . Diabetes mellitus    TYPE II  . GERD (gastroesophageal reflux disease)   . Hay fever    ALLERGIES  . Heart murmur    Past Surgical History:  Procedure Laterality Date  . BILATERAL KNEE ARTHROSCOPY     X2  . ELBOW ARTHROSCOPY WITH TENDON RECONSTRUCTION Right 12/22/2016  . Neuroplasty Digital Nerve Right 06/21/2013   @ [place]  . ROTATOR CUFF REPAIR Bilateral   . SKIN CANCER EXCISION     ON CHEST  . Tarsal Exostectomy Right 06/21/2013   @ [place]    reports that he has never smoked. He has quit using smokeless tobacco. He reports current alcohol use. He reports that he does not use drugs. family history includes Arthritis in an other family member; Colon cancer in his paternal grandmother; Diabetes in his brother, maternal grandmother, paternal grandmother, sister, and another family member; Head & neck cancer in his brother; Heart attack in his father; Hypertension in an other family member; Lung cancer in his father; Stroke in his paternal grandfather. No Known Allergies   Review of Systems  Constitutional: Negative for activity change, appetite change, fatigue and fever.  HENT: Negative for congestion, ear pain and trouble swallowing.   Eyes: Negative for pain and visual disturbance.  Respiratory:  Negative for cough, shortness of breath and wheezing.   Cardiovascular: Negative for chest pain and palpitations.  Gastrointestinal: Negative for abdominal distention, abdominal pain, blood in stool, constipation, diarrhea, nausea, rectal pain and vomiting.  Genitourinary: Negative for dysuria, hematuria and testicular pain.  Musculoskeletal: Negative for arthralgias and joint swelling.  Skin: Negative for rash.  Neurological: Negative for dizziness, syncope and headaches.  Hematological: Negative for adenopathy.  Psychiatric/Behavioral: Negative for confusion and dysphoric mood.       Objective:   Physical Exam Constitutional:      General: He is not in acute distress.    Appearance: He is well-developed.  HENT:     Head: Normocephalic and atraumatic.     Right Ear: External ear normal.     Left Ear: External ear normal.  Eyes:     Conjunctiva/sclera: Conjunctivae normal.     Pupils: Pupils are equal, round, and reactive to light.  Neck:     Musculoskeletal: Normal range of motion and neck supple.     Thyroid: No thyromegaly.  Cardiovascular:     Rate and Rhythm: Normal rate and regular rhythm.     Heart sounds: Normal heart sounds. No murmur.  Pulmonary:     Effort: No respiratory distress.     Breath sounds: No wheezing or rales.  Abdominal:     General: Bowel sounds are normal. There is no distension.     Palpations: Abdomen is soft. There is no mass.  Tenderness: There is no abdominal tenderness. There is no guarding or rebound.  Lymphadenopathy:     Cervical: No cervical adenopathy.  Skin:    Findings: No rash.  Neurological:     Mental Status: He is alert and oriented to person, place, and time.     Cranial Nerves: No cranial nerve deficit.     Deep Tendon Reflexes: Reflexes normal.        Assessment:     Physical exam.  The following issues were discussed    Plan:     -Consider Shingrix vaccine.  He will check on insurance coverage -Flu vaccine  given -Obtain lab work including A1c and urine microalbumin with his diabetes status -Routine diabetic follow-up in about 6 months  Eulas Post MD  Primary Care at [facility]

## 2018-04-08 LAB — BASIC METABOLIC PANEL
BUN: 17 mg/dL (ref 7–25)
CO2: 27 mmol/L (ref 20–32)
Calcium: 9.5 mg/dL (ref 8.6–10.3)
Chloride: 101 mmol/L (ref 98–110)
Creat: 1 mg/dL (ref 0.70–1.33)
Glucose, Bld: 120 mg/dL — ABNORMAL HIGH (ref 65–99)
Potassium: 3.9 mmol/L (ref 3.5–5.3)
Sodium: 139 mmol/L (ref 135–146)

## 2018-04-08 LAB — CBC WITH DIFFERENTIAL/PLATELET
Absolute Monocytes: 442 cells/uL (ref 200–950)
Basophils Absolute: 20 cells/uL (ref 0–200)
Basophils Relative: 0.3 %
EOS PCT: 1.6 %
Eosinophils Absolute: 109 cells/uL (ref 15–500)
HEMATOCRIT: 40.7 % (ref 38.5–50.0)
Hemoglobin: 14.2 g/dL (ref 13.2–17.1)
Lymphs Abs: 2237 cells/uL (ref 850–3900)
MCH: 30.6 pg (ref 27.0–33.0)
MCHC: 34.9 g/dL (ref 32.0–36.0)
MCV: 87.7 fL (ref 80.0–100.0)
MPV: 10 fL (ref 7.5–12.5)
Monocytes Relative: 6.5 %
NEUTROS PCT: 58.7 %
Neutro Abs: 3992 cells/uL (ref 1500–7800)
Platelets: 303 10*3/uL (ref 140–400)
RBC: 4.64 10*6/uL (ref 4.20–5.80)
RDW: 12.3 % (ref 11.0–15.0)
Total Lymphocyte: 32.9 %
WBC: 6.8 10*3/uL (ref 3.8–10.8)

## 2018-04-08 LAB — LIPID PANEL
Cholesterol: 187 mg/dL (ref <200)
HDL: 41 mg/dL (ref >40)
LDL CHOLESTEROL (CALC): 128 mg/dL — AB
Non-HDL Cholesterol (Calc): 146 mg/dL (calc) — ABNORMAL HIGH (ref <130)
TRIGLYCERIDES: 79 mg/dL (ref <150)
Total CHOL/HDL Ratio: 4.6 (calc) (ref <5.0)

## 2018-04-08 LAB — HEPATIC FUNCTION PANEL
AG Ratio: 2.1 (calc) (ref 1.0–2.5)
ALT: 19 U/L (ref 9–46)
AST: 16 U/L (ref 10–35)
Albumin: 4.8 g/dL (ref 3.6–5.1)
Alkaline phosphatase (APISO): 74 U/L (ref 40–115)
BILIRUBIN TOTAL: 0.5 mg/dL (ref 0.2–1.2)
Bilirubin, Direct: 0.1 mg/dL (ref 0.0–0.2)
Globulin: 2.3 g/dL (calc) (ref 1.9–3.7)
Indirect Bilirubin: 0.4 mg/dL (calc) (ref 0.2–1.2)
Total Protein: 7.1 g/dL (ref 6.1–8.1)

## 2018-04-08 LAB — MICROALBUMIN / CREATININE URINE RATIO
CREATININE, URINE: 30 mg/dL (ref 20–320)
Microalb, Ur: <0.2 mg/dL

## 2018-04-08 LAB — TSH: TSH: 2.02 mIU/L (ref 0.40–4.50)

## 2018-04-08 LAB — HEMOGLOBIN A1C
Hgb A1c MFr Bld: 6.7 % of total Hgb — ABNORMAL HIGH (ref <5.7)
Mean Plasma Glucose: 146 (calc)
eAG (mmol/L): 8.1 (calc)

## 2018-04-08 LAB — PSA: PSA: 0.4 ng/mL (ref <=4.0)

## 2018-04-09 ENCOUNTER — Encounter: Payer: Self-pay | Admitting: Family Medicine

## 2018-04-24 ENCOUNTER — Other Ambulatory Visit: Payer: Self-pay | Admitting: Family Medicine

## 2018-05-10 ENCOUNTER — Encounter: Payer: Self-pay | Admitting: Family Medicine

## 2018-05-17 ENCOUNTER — Other Ambulatory Visit: Payer: Self-pay | Admitting: Family Medicine

## 2018-05-19 ENCOUNTER — Ambulatory Visit (INDEPENDENT_AMBULATORY_CARE_PROVIDER_SITE_OTHER): Payer: 59 | Admitting: Family Medicine

## 2018-05-19 ENCOUNTER — Other Ambulatory Visit: Payer: Self-pay

## 2018-05-19 ENCOUNTER — Encounter: Payer: Self-pay | Admitting: Family Medicine

## 2018-05-19 VITALS — BP 118/78 | HR 76 | Temp 98.1°F | Ht 68.75 in | Wt 203.2 lb

## 2018-05-19 DIAGNOSIS — Z7184 Encounter for health counseling related to travel: Secondary | ICD-10-CM

## 2018-05-19 DIAGNOSIS — M1811 Unilateral primary osteoarthritis of first carpometacarpal joint, right hand: Secondary | ICD-10-CM

## 2018-05-19 DIAGNOSIS — Z23 Encounter for immunization: Secondary | ICD-10-CM | POA: Diagnosis not present

## 2018-05-19 MED ORDER — ATOVAQUONE-PROGUANIL HCL 250-100 MG PO TABS: ORAL_TABLET | ORAL | 0 refills | Status: DC

## 2018-05-19 MED ORDER — TYPHOID VACCINE PO CPDR: 1.0000 | DELAYED_RELEASE_CAPSULE | ORAL | 0 refills | Status: DC

## 2018-05-19 MED ORDER — ONDANSETRON 8 MG PO TBDP: 8.0000 mg | ORAL_TABLET | Freq: Three times a day (TID) | ORAL | 0 refills | Status: DC | PRN

## 2018-05-19 NOTE — Progress Notes (Signed)
Subjective:     Patient ID: JENNA ARDOIN, male   DOB: 03/15/1966, 53 y.o.   MRN: [id number]  HPI Patient is here to discuss the following  Travel advice encounter.  Upcoming travel to [place] in March.  His daughter will be marrying a guy from [place].  They are going to meet his parents.  They will be there for about a week.  Patient's tetanus is up-to-date.  He will need malaria prevention and also typhoid prevention.  He does not have documentation of MMR.  He thinks he had one vaccine only.  Has had previous hepatitis A vaccine.  Right thumb pain.  Location is MCP joint.  He has some nodular swelling there.  He is right-hand dominant.  Recent increased pain with gripping.  Possibly some weakness.  Denies any other finger pain.  He has diabetes which has been well controlled for several years with metformin.  Requesting Shingrix vaccine.    Past Medical History:  Diagnosis Date  . Arthritis   . Diabetes mellitus    TYPE II  . GERD (gastroesophageal reflux disease)   . Hay fever    ALLERGIES  . Heart murmur    Past Surgical History:  Procedure Laterality Date  . BILATERAL KNEE ARTHROSCOPY     X2  . ELBOW ARTHROSCOPY WITH TENDON RECONSTRUCTION Right 12/22/2016  . Neuroplasty Digital Nerve Right 06/21/2013   @ [place]  . ROTATOR CUFF REPAIR Bilateral   . SKIN CANCER EXCISION     ON CHEST  . Tarsal Exostectomy Right 06/21/2013   @ [place]    reports that he has never smoked. He has quit using smokeless tobacco. He reports current alcohol use. He reports that he does not use drugs. family history includes Arthritis in an other family member; Colon cancer in his paternal grandmother; Diabetes in his brother, maternal grandmother, paternal grandmother, sister, and another family member; Head & neck cancer in his brother; Heart attack in his father; Hypertension in an other family member; Lung cancer in his father; Stroke in his paternal grandfather. No Known Allergies   Review of  Systems  Constitutional: Negative for chills, fatigue and fever.  Eyes: Negative for visual disturbance.  Respiratory: Negative for cough, chest tightness and shortness of breath.   Cardiovascular: Negative for chest pain, palpitations and leg swelling.  Endocrine: Negative for polydipsia and polyuria.  Musculoskeletal: Positive for arthralgias.  Neurological: Negative for dizziness, syncope, weakness, light-headedness and headaches.       Objective:   Physical Exam Constitutional:      Appearance: Normal appearance.  Cardiovascular:     Rate and Rhythm: Normal rate and regular rhythm.  Pulmonary:     Effort: Pulmonary effort is normal.     Breath sounds: Normal breath sounds.  Musculoskeletal:     Comments: Right hand reveals some tenderness MCP joint.  No warmth.  No erythema.  No ecchymosis.  He has nodular changes of several digits consistent with osteoarthritis.  Neurological:     Mental Status: He is alert.        Assessment:     #1 travel advice encounter.  Upcoming travel to [place].  He will need malaria prevention, typhoid prevention and also need to confirm previous MMR vaccination status  #2 osteoarthritis involving right thumb    Plan:     -Malarone 250 mg take 1 daily starting 1 day prior to travel, during travel, and for 1 week after return -Vivotif 1 every other day  for 4 doses.  He will start that now prior to starting the Malarone several weeks from now -Zofran 8 mg ODT 1 every 8 hours as needed for nausea or vomiting.  Gave him some at his request for upcoming travel -Patient unable to confirm previous MMR vaccine status.  We decided to go ahead and check antibody levels -He has some leftover diclofenac gel and will try that 2-3 times daily for right thumb symptoms -Initial Shingrix vaccine given and return in 2 to 6 months for second  Eulas Post MD [place] [facility] at [facility]

## 2018-05-22 LAB — MEASLES/MUMPS/RUBELLA IMMUNITY
Mumps IgG: <9 AU/mL — ABNORMAL LOW
Rubella: 19.5 index
Rubeola IgG: 54 AU/mL

## 2018-05-31 ENCOUNTER — Encounter: Payer: Self-pay | Admitting: Family Medicine

## 2018-06-06 ENCOUNTER — Other Ambulatory Visit: Payer: Self-pay

## 2018-06-06 ENCOUNTER — Encounter: Payer: Self-pay | Admitting: Family Medicine

## 2018-06-06 DIAGNOSIS — M79644 Pain in right finger(s): Secondary | ICD-10-CM

## 2018-06-07 DIAGNOSIS — M79644 Pain in right finger(s): Secondary | ICD-10-CM | POA: Diagnosis not present

## 2018-07-18 DIAGNOSIS — M65311 Trigger thumb, right thumb: Secondary | ICD-10-CM | POA: Diagnosis not present

## 2018-08-14 DIAGNOSIS — M65311 Trigger thumb, right thumb: Secondary | ICD-10-CM | POA: Diagnosis not present

## 2018-08-15 ENCOUNTER — Other Ambulatory Visit: Payer: Self-pay | Admitting: Family Medicine

## 2018-08-23 ENCOUNTER — Telehealth: Payer: Self-pay | Admitting: Family Medicine

## 2018-08-23 ENCOUNTER — Other Ambulatory Visit: Payer: Self-pay | Admitting: Family Medicine

## 2018-08-23 MED ORDER — ESZOPICLONE 3 MG PO TABS: ORAL_TABLET | ORAL | 0 refills | Status: DC

## 2018-08-23 NOTE — Telephone Encounter (Signed)
Lunesta refilled

## 2018-09-24 ENCOUNTER — Other Ambulatory Visit: Payer: Self-pay | Admitting: Family Medicine

## 2018-09-28 ENCOUNTER — Telehealth: Payer: Self-pay

## 2018-09-28 NOTE — Telephone Encounter (Signed)
Left message for patient to call back and schedule nurse visit.  His 2nd shingles vaccine is due by 6/31/2020.  CRM created.

## 2018-09-29 ENCOUNTER — Ambulatory Visit (INDEPENDENT_AMBULATORY_CARE_PROVIDER_SITE_OTHER): Payer: 59 | Admitting: *Deleted

## 2018-09-29 ENCOUNTER — Other Ambulatory Visit: Payer: Self-pay

## 2018-09-29 DIAGNOSIS — Z23 Encounter for immunization: Secondary | ICD-10-CM

## 2018-10-30 ENCOUNTER — Encounter: Payer: Self-pay | Admitting: Family Medicine

## 2018-11-13 ENCOUNTER — Other Ambulatory Visit: Payer: Self-pay | Admitting: Family Medicine

## 2018-12-10 IMAGING — MR MR WRIST*R* W/O CM
4 of 5 series · 13 of 40 positions shown · non-contrast
Comparison: None.

CLINICAL DATA: A tree struck the patient's arm in May 2017.
Weakness and reduced range of motion along with pain since that
time. By report there is a fracture of the distal radius with dorsal
angulation.

EXAM:
MR OF THE RIGHT WRIST WITHOUT CONTRAST
TECHNIQUE: Multiplanar, multisequence MR imaging of the right wrist was
performed. No intravenous contrast was administered.

[Series 3: T2 fat-sat · axial · 3.0mm · 0.19mm/px · z∈[-40,-2]mm · 3 of 20 slices shown (1 of 2)]
[im 3/20]
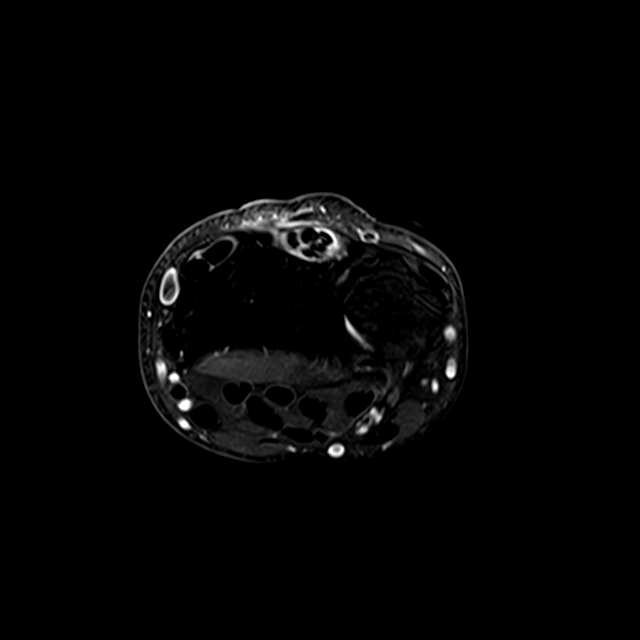
[im 11/20]
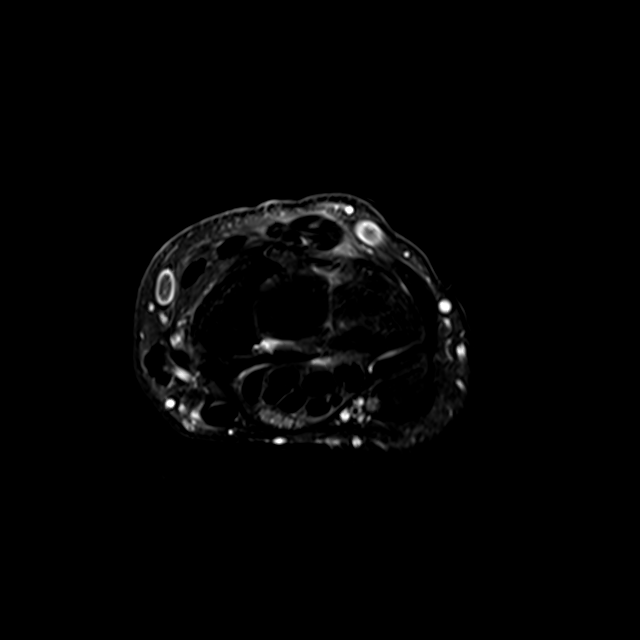
[im 17/20]
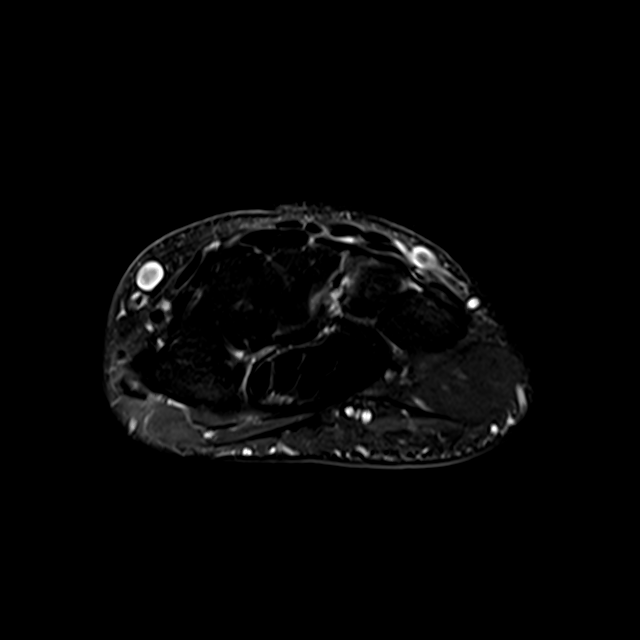

[Series 4: PD fat-sat · coronal · 3.0mm · 0.19mm/px · 4 of 17 slices shown (1 of 2)]
[im 1/17]
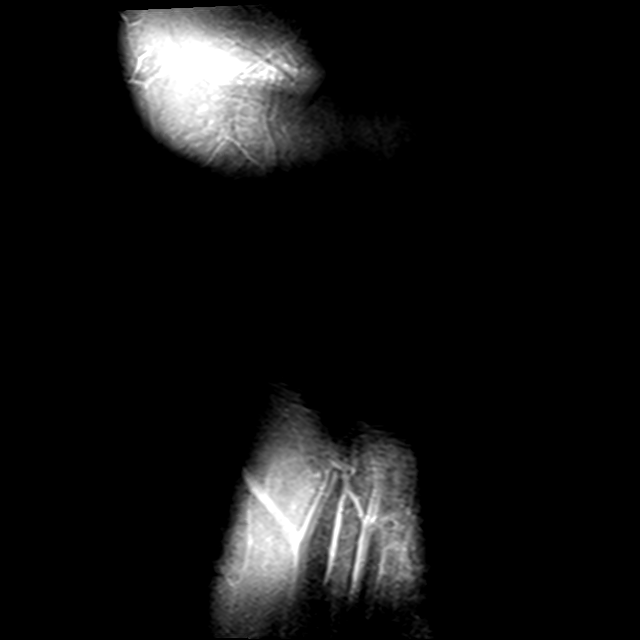
[im 3/17]
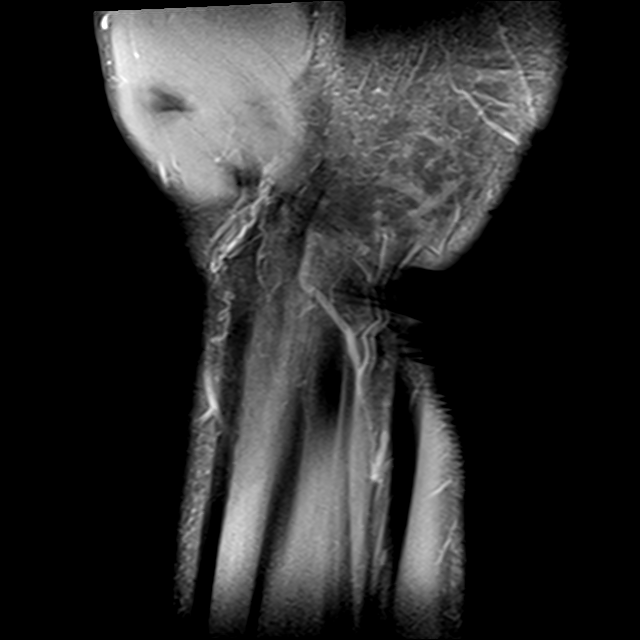
[im 10/17]
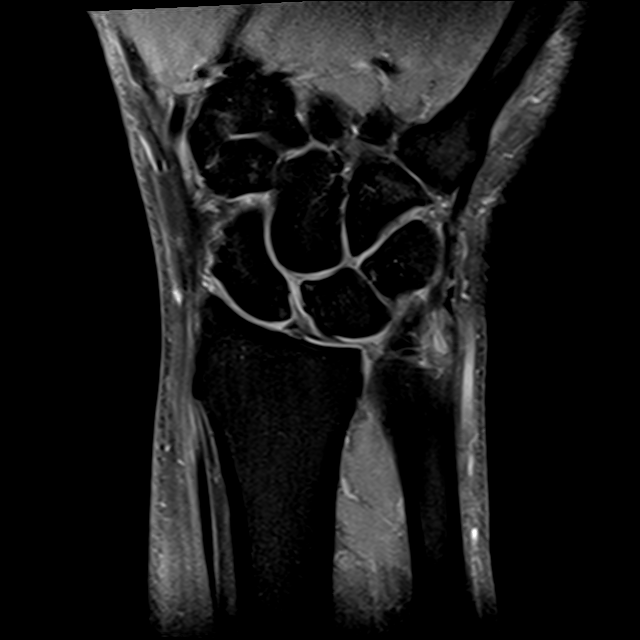
[im 14/17]
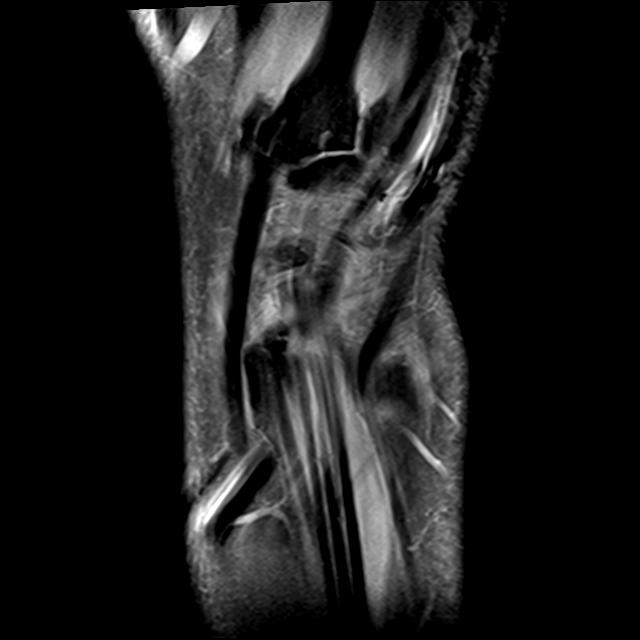

[Series 5: T2 fat-sat · coronal · 3.0mm · 0.19mm/px · 3 of 15 slices shown (2 of 2)]
[im 3/15]
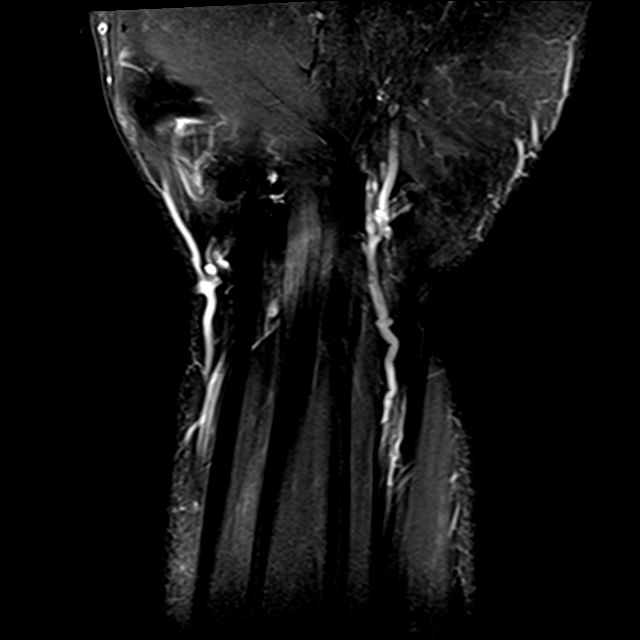
[im 8/15]
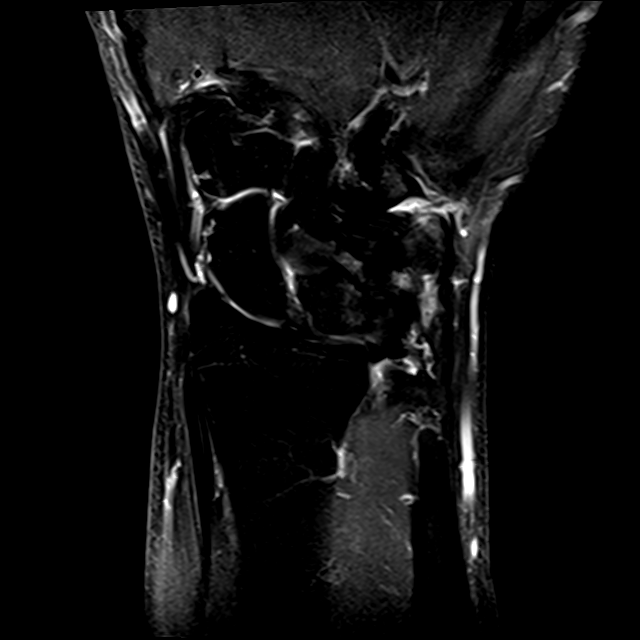
[im 12/15]
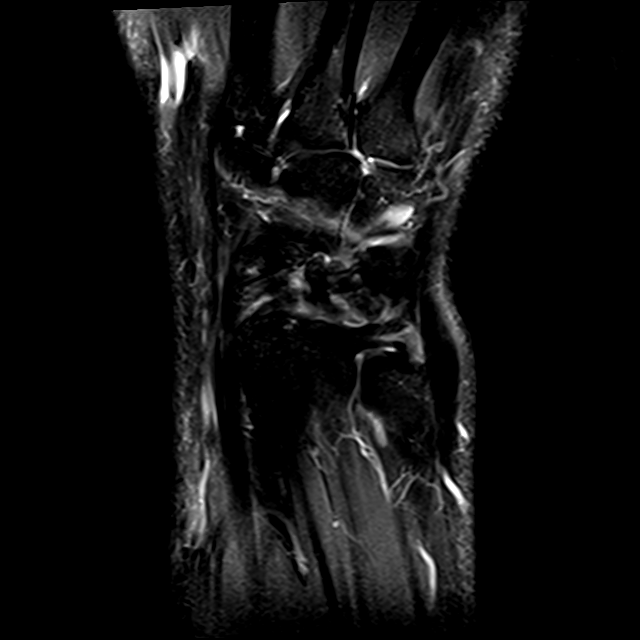

[Series 7: PD fat-sat · sagittal · 3.0mm · 0.19mm/px · 3 of 23 slices shown (2 of 2)]
[im 3/23]
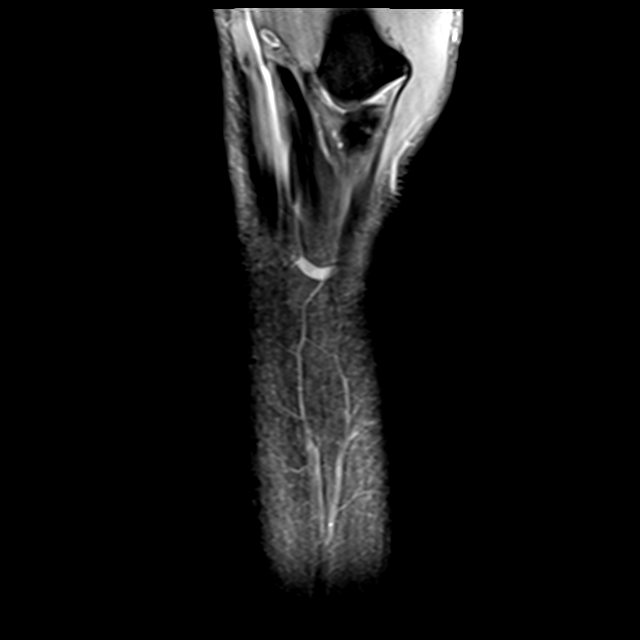
[im 13/23]
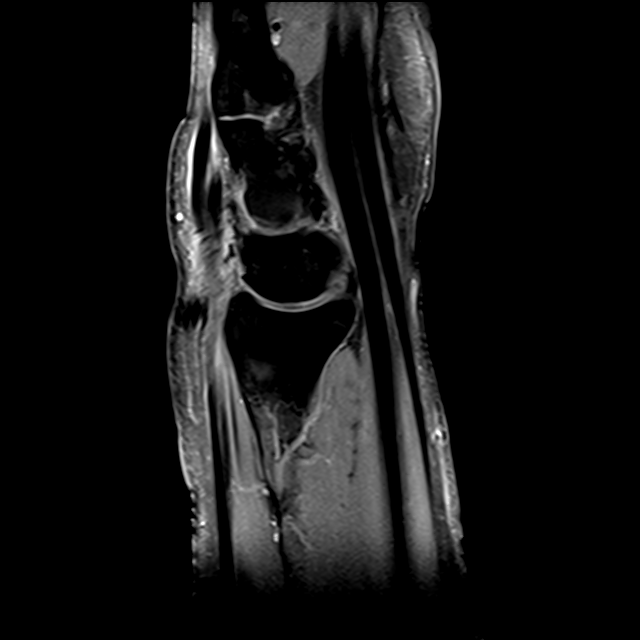
[im 20/23]
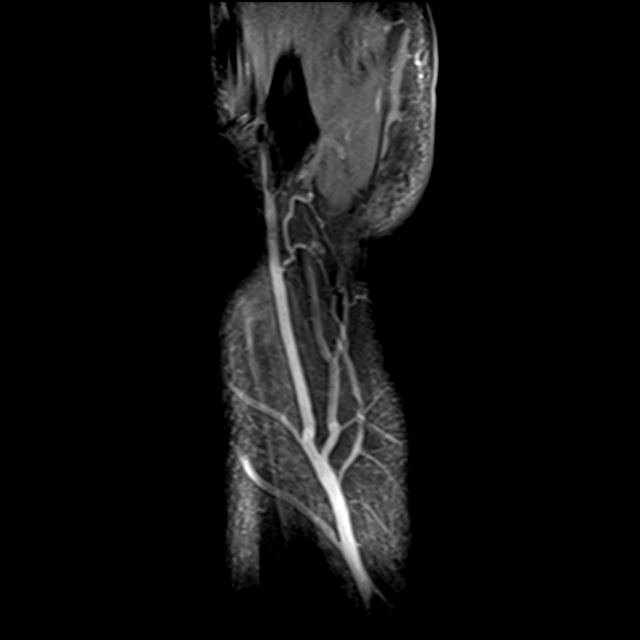

[13 of 40 positions shown; findings below may reference images not displayed]

FINDINGS: Ligaments: Intrinsic interosseous ligaments appear grossly intact.
No appreciable dorsal or volar ligamentous discontinuity.

Triangular fibrocartilage: There is thickening of the ulnolunate and
ulnotriquetral components of the TFCC (reference images 7 through 9
series 5) and potentially some mild thickening of the TFCC disc
(images 10 through 12 of series 5). I do not see an obvious
discontinuity in the TFCC.

Tendons: There is tenosynovitis and peritendinitis in extensor
compartment # 4 with associated tendinopathy of the extensor
digitorum tendons, images 2 through 20 of series 3. There is
borderline thickening of the contour of the extensor carpi radialis
brevis and extensor carpi radialis longus tendons in extensor
compartment # 2.

Carpal tunnel/median nerve: Unremarkable

Guyon's canal: Unremarkable

Joint/cartilage: Unremarkable

Bones/carpal alignment: No current fracture or significant abnormal
osseous edema. I do not observe the distal radius to be
significantly dorsally angulated at this time.

Other: No supplemental non-categorized findings.
IMPRESSION: 1. Tenosynovitis, peritendinitis, and tendinopathy and extensor
compartment # 4 involving the extensor digitorum tendons.
2. Borderline thickening of the tendons of extensor compartment # 2,
but without tenosynovitis.
3. Unusual appearance of the triangular fibrocartilage complex, with
abnormal thickening of the ulnolunate and ulnotriquetral components
of the TFCC, and potentially some thickening of the TFCC disc, but
without observed discontinuity.

## 2019-01-22 LAB — HM DIABETES EYE EXAM

## 2019-02-09 ENCOUNTER — Other Ambulatory Visit: Payer: Self-pay | Admitting: Family Medicine

## 2019-03-13 ENCOUNTER — Other Ambulatory Visit: Payer: Self-pay | Admitting: Family Medicine

## 2019-04-02 ENCOUNTER — Encounter: Payer: Self-pay | Admitting: Family Medicine

## 2019-04-02 ENCOUNTER — Other Ambulatory Visit: Payer: Self-pay

## 2019-04-02 ENCOUNTER — Telehealth (INDEPENDENT_AMBULATORY_CARE_PROVIDER_SITE_OTHER): Payer: 59 | Admitting: Family Medicine

## 2019-04-02 DIAGNOSIS — H9202 Otalgia, left ear: Secondary | ICD-10-CM

## 2019-04-02 MED ORDER — AMOXICILLIN-POT CLAVULANATE 875-125 MG PO TABS: 1.0000 | ORAL_TABLET | Freq: Two times a day (BID) | ORAL | 0 refills | Status: DC

## 2019-04-02 NOTE — Progress Notes (Signed)
This visit type was conducted due to national recommendations for restrictions regarding the COVID-19 pandemic in an effort to limit this patient's exposure and mitigate transmission in our community.   Virtual Visit via Video Note  I connected with Spencer Duffy on 04/02/19 at  3:45 PM EST by a video enabled telemedicine application and verified that I am speaking with the correct person using two identifiers.  Location patient: home Location provider:work or home office Persons participating in the virtual visit: patient, provider  I discussed the limitations of evaluation and management by telemedicine and the availability of in person appointments. The patient expressed understanding and agreed to proceed.   HPI: Acencion called with recurrent left ear pain.  He states about 3 weeks ago he had left ear pain and went to an urgent care and was diagnosed with otitis media.  He was placed on Augmentin and after about 7 days his ear pain had basically resolved.  He had Covid testing then which was negative.  He does have some mild URI symptoms right now with mild nasal congestion.  Rare cough.  He tried DayQuil and NyQuil for his ear pain without improvement.  Denies any fever.  No known sick contacts.  Denies any ear drainage at this time.  No hearing changes.   ROS: See pertinent positives and negatives per HPI.  Past Medical History:  Diagnosis Date  . Arthritis   . Diabetes mellitus    TYPE II  . GERD (gastroesophageal reflux disease)   . Hay fever    ALLERGIES  . Heart murmur     Past Surgical History:  Procedure Laterality Date  . BILATERAL KNEE ARTHROSCOPY     X2  . ELBOW ARTHROSCOPY WITH TENDON RECONSTRUCTION Right 12/22/2016  . Neuroplasty Digital Nerve Right 06/21/2013   @ [place]  . ROTATOR CUFF REPAIR Bilateral   . SKIN CANCER EXCISION     ON CHEST  . Tarsal Exostectomy Right 06/21/2013   @ [place]    Family History  Problem Relation Age of Onset  . Arthritis Other   . Hypertension  Other   . Diabetes Other        PARENT, GRANDPARENT  . Lung cancer Father   . Heart attack Father   . Colon cancer Paternal Grandmother        42's  . Diabetes Paternal Grandmother   . Diabetes Sister   . Head & neck cancer Brother   . Diabetes Brother   . Diabetes Maternal Grandmother   . Stroke Paternal Grandfather     SOCIAL HX: Non-smoker   Current Outpatient Medications:  .  amoxicillin-clavulanate (AUGMENTIN) 875-125 MG tablet, Take 1 tablet by mouth 2 (two) times daily., Disp: 20 tablet, Rfl: 0 .  atovaquone-proguanil (MALARONE) 250-100 MG TABS tablet, Take one tablet by mouth daily starting one day prior travel, during travel, and for one week after return., Disp: 16 tablet, Rfl: 0 .  cetirizine (ZYRTEC) 10 MG tablet, Take 10 mg by mouth daily., Disp: , Rfl:  .  Eszopiclone 3 MG TABS, TAKE 1 TABLET BY MOUTH IMMEDIATELY BEFORE BEDTIME AS NEEDED, Disp: 30 tablet, Rfl: 0 .  metFORMIN (GLUCOPHAGE) 500 MG tablet, TAKE 1 TABLET TWICE A DAY  WITH MEALS, Disp: 180 tablet, Rfl: 0 .  Multiple Vitamin (MULTIVITAMIN) tablet, Take 1 tablet by mouth daily., Disp: , Rfl:  .  ondansetron (ZOFRAN ODT) 8 MG disintegrating tablet, Take 1 tablet (8 mg total) by mouth every 8 (eight) hours as needed for  nausea or vomiting., Disp: 15 tablet, Rfl: 0 .  ramipril (ALTACE) 5 MG capsule, TAKE 1 CAPSULE DAILY, Disp: 90 capsule, Rfl: 1  EXAM:  VITALS per patient if applicable:  GENERAL: alert, oriented, appears well and in no acute distress  HEENT: atraumatic, conjunttiva clear, no obvious abnormalities on inspection of external nose and ears  NECK: normal movements of the head and neck  LUNGS: on inspection no signs of respiratory distress, breathing rate appears normal, no obvious gross SOB, gasping or wheezing  CV: no obvious cyanosis  MS: moves all visible extremities without noticeable abnormality  PSYCH/NEURO: pleasant and cooperative, no obvious depression or anxiety, speech and thought  processing grossly intact  ASSESSMENT AND PLAN:  Discussed the following assessment and plan:  Recurrent left otalgia.  Recent positive response with Augmentin.  -We agreed to refill Augmentin 875 mg twice daily for 10 days -If ear pain not clearing with that recommend office follow-up to further assess -We offered repeat Covid testing and at this point he declines     I discussed the assessment and treatment plan with the patient. The patient was provided an opportunity to ask questions and all were answered. The patient agreed with the plan and demonstrated an understanding of the instructions.   The patient was advised to call back or seek an in-person evaluation if the symptoms worsen or if the condition fails to improve as anticipated.    Carolann Littler, MD

## 2019-04-06 ENCOUNTER — Other Ambulatory Visit: Payer: Self-pay | Admitting: Family Medicine

## 2019-05-10 ENCOUNTER — Other Ambulatory Visit: Payer: Self-pay | Admitting: Family Medicine

## 2019-06-02 ENCOUNTER — Other Ambulatory Visit: Payer: Self-pay | Admitting: Family Medicine

## 2019-06-15 ENCOUNTER — Encounter: Payer: Self-pay | Admitting: Family Medicine

## 2019-06-15 ENCOUNTER — Other Ambulatory Visit: Payer: Self-pay

## 2019-06-15 ENCOUNTER — Ambulatory Visit (INDEPENDENT_AMBULATORY_CARE_PROVIDER_SITE_OTHER): Payer: 59 | Admitting: Family Medicine

## 2019-06-15 VITALS — BP 108/68 | HR 77 | Temp 97.6°F | Wt 206.8 lb

## 2019-06-15 DIAGNOSIS — M79671 Pain in right foot: Secondary | ICD-10-CM

## 2019-06-15 DIAGNOSIS — R400 Somnolence: Secondary | ICD-10-CM | POA: Diagnosis not present

## 2019-06-15 DIAGNOSIS — E1169 Type 2 diabetes mellitus with other specified complication: Secondary | ICD-10-CM

## 2019-06-15 DIAGNOSIS — E785 Hyperlipidemia, unspecified: Secondary | ICD-10-CM

## 2019-06-15 DIAGNOSIS — E119 Type 2 diabetes mellitus without complications: Secondary | ICD-10-CM

## 2019-06-15 LAB — POCT GLYCOSYLATED HEMOGLOBIN (HGB A1C): Hemoglobin A1C: 6.6 % — AB (ref 4.0–5.6)

## 2019-06-15 MED ORDER — RAMIPRIL 5 MG PO CAPS: 5.0000 mg | ORAL_CAPSULE | Freq: Every day | ORAL | 3 refills | Status: DC

## 2019-06-15 MED ORDER — METFORMIN HCL 500 MG PO TABS: ORAL_TABLET | ORAL | 3 refills | Status: DC

## 2019-06-15 MED ORDER — ROSUVASTATIN CALCIUM 10 MG PO TABS: 10.0000 mg | ORAL_TABLET | Freq: Every day | ORAL | 3 refills | Status: DC

## 2019-06-15 MED ORDER — ESZOPICLONE 3 MG PO TABS: ORAL_TABLET | ORAL | 1 refills | Status: DC

## 2019-06-15 NOTE — Progress Notes (Signed)
Established Patient Office Visit  Subjective:  Patient ID: Spencer Duffy, male    DOB: [birth year]  Age: 54 y.o. MRN: [id number]  CC:  Chief Complaint  Patient presents with  . Diabetes  . Numbness    toe    HPI Spencer Duffy presents for prescription renewal. He is overdue for his annual physical exam and was unable to get refills of his medications. He reports he is overall feeling well. Has pain in MTP joint of right great toe and decreased sensation on the plantar surface. Also reports not sleeping well, tossing and turning for hours and on at least 1 night per week he gets out of bed in the early morning hours because he cannot fall back asleep. He got a [employer] watch which tracks his sleep times and also pulse ox readings. He reports low O2 readings of 80% on a few occasions. Reports that his wife says he snores but does not report apneic episodes.  Past Medical History:  Diagnosis Date  . Arthritis   . Diabetes mellitus    TYPE II  . GERD (gastroesophageal reflux disease)   . Hay fever    ALLERGIES  . Heart murmur     Past Surgical History:  Procedure Laterality Date  . BILATERAL KNEE ARTHROSCOPY     X2  . ELBOW ARTHROSCOPY WITH TENDON RECONSTRUCTION Right 12/22/2016  . Neuroplasty Digital Nerve Right 06/21/2013   @ [place]  . ROTATOR CUFF REPAIR Bilateral   . SKIN CANCER EXCISION     ON CHEST  . Tarsal Exostectomy Right 06/21/2013   @ [place]    Family History  Problem Relation Age of Onset  . Arthritis Other   . Hypertension Other   . Diabetes Other        PARENT, GRANDPARENT  . Lung cancer Father   . Heart attack Father   . Colon cancer Paternal Grandmother        92's  . Diabetes Paternal Grandmother   . Diabetes Sister   . Head & neck cancer Brother   . Diabetes Brother   . Diabetes Maternal Grandmother   . Stroke Paternal Grandfather     Social History   Socioeconomic History  . Marital status: Married    Spouse name: Not on file  . Number of children: Not  on file  . Years of education: Not on file  . Highest education level: Not on file  Occupational History    Employer: [employer]  Tobacco Use  . Smoking status: Never Smoker  . Smokeless tobacco: Former Network engineer and Sexual Activity  . Alcohol use: Yes    Comment: wine occ   . Drug use: No  . Sexual activity: Not on file  Other Topics Concern  . Not on file  Social History Narrative  . Not on file   Social Determinants of Health   Financial Resource Strain:   . Difficulty of Paying Living Expenses: Not on file  Food Insecurity:   . Worried About Charity fundraiser in the Last Year: Not on file  . Ran Out of Food in the Last Year: Not on file  Transportation Needs:   . Lack of Transportation (Medical): Not on file  . Lack of Transportation (Non-Medical): Not on file  Physical Activity:   . Days of Exercise per Week: Not on file  . Minutes of Exercise per Session: Not on file  Stress:   . Feeling of Stress : Not  on file  Social Connections:   . Frequency of Communication with Friends and Family: Not on file  . Frequency of Social Gatherings with Friends and Family: Not on file  . Attends Religious Services: Not on file  . Active Member of Clubs or Organizations: Not on file  . Attends Archivist Meetings: Not on file  . Marital Status: Not on file  Intimate Partner Violence:   . Fear of Current or Ex-Partner: Not on file  . Emotionally Abused: Not on file  . Physically Abused: Not on file  . Sexually Abused: Not on file    Outpatient Medications Prior to Visit  Medication Sig Dispense Refill  . Eszopiclone 3 MG TABS TAKE 1 TABLET BY MOUTH IMMEDIATELY BEFORE BEDTIME AS NEEDED 30 tablet 0  . metFORMIN (GLUCOPHAGE) 500 MG tablet TAKE 1 TABLET TWICE A DAY  WITH MEALS - OFFICE VISIT  NEEDED FOR REFILLS 60 tablet 0  . Multiple Vitamin (MULTIVITAMIN) tablet Take 1 tablet by mouth daily.    . ondansetron (ZOFRAN ODT) 8 MG disintegrating tablet Take 1 tablet  (8 mg total) by mouth every 8 (eight) hours as needed for nausea or vomiting. 15 tablet 0  . ramipril (ALTACE) 5 MG capsule TAKE 1 CAPSULE DAILY 90 capsule 1  . amoxicillin-clavulanate (AUGMENTIN) 875-125 MG tablet Take 1 tablet by mouth 2 (two) times daily. 20 tablet 0  . atovaquone-proguanil (MALARONE) 250-100 MG TABS tablet Take one tablet by mouth daily starting one day prior travel, during travel, and for one week after return. 16 tablet 0  . cetirizine (ZYRTEC) 10 MG tablet Take 10 mg by mouth daily.     No facility-administered medications prior to visit.    No Known Allergies  ROS Review of Systems  GEN: Well nourished, well developed, in no acute distress, Alert and interactive HEENT: normocephalic, atraumatic. PERRL, EOMI, good conjugate gaze. Nares symmetric, patent. Moist mucous membranes Neck: Supple, no JVD or masses. Normal ROM Cardiac: RRR; no murmurs, rubs, or gallops, no edema  Respiratory:  clear to auscultation bilaterally, normal work of breathing GI: soft, nondistended MS: Normal tone and ROM, strength and sensation intact, cap refill <2sec, 2+ distal pulses bilaterally. Rt MTP joint tenderness of great toe w/mild erythema.  Skin: Intact, warm and dry, no rashes, no lesions, no erythema Neuro:  Alert and Oriented x 3, Cranial nerves II to XII intact, Reflex symmetric, Sensation intact, follows commands, gait normal Psych: euthymic mood, full affect    Objective:    Physical Exam  BP 108/68 (BP Location: Left Arm, Patient Position: Sitting, Cuff Size: Large)   Pulse 77   Temp 97.6 F (36.4 C) (Temporal)   Wt 206 lb 12.8 oz (93.8 kg)   SpO2 96%   BMI 30.76 kg/m  Wt Readings from Last 3 Encounters:  06/15/19 206 lb 12.8 oz (93.8 kg)  05/19/18 203 lb 3.2 oz (92.2 kg)  04/07/18 206 lb 6.4 oz (93.6 kg)     Health Maintenance Due  Topic Date Due  . FOOT EXAM  08/10/2013  . INFLUENZA VACCINE  11/18/2018    There are no preventive care reminders to  display for this patient.  Lab Results  Component Value Date   TSH 2.02 04/07/2018   Lab Results  Component Value Date   WBC 6.8 04/07/2018   HGB 14.2 04/07/2018   HCT 40.7 04/07/2018   MCV 87.7 04/07/2018   PLT 303 04/07/2018   Lab Results  Component Value Date  NA 139 04/07/2018   K 3.9 04/07/2018   CO2 27 04/07/2018   GLUCOSE 120 (H) 04/07/2018   BUN 17 04/07/2018   CREATININE 1.00 04/07/2018   BILITOT 0.5 04/07/2018   ALKPHOS 72 12/01/2016   AST 16 04/07/2018   ALT 19 04/07/2018   PROT 7.1 04/07/2018   ALBUMIN 4.7 12/01/2016   CALCIUM 9.5 04/07/2018   GFR 101.41 12/01/2016   Lab Results  Component Value Date   CHOL 187 04/07/2018   Lab Results  Component Value Date   HDL 41 04/07/2018   Lab Results  Component Value Date   LDLCALC 128 (H) 04/07/2018   Lab Results  Component Value Date   TRIG 79 04/07/2018   Lab Results  Component Value Date   CHOLHDL 4.6 04/07/2018   Lab Results  Component Value Date   HGBA1C 6.6 (A) 06/15/2019      Assessment & Plan:   Problem List Items Addressed This Visit      Endocrine   Type II diabetes mellitus, well controlled ([place]) - Primary   Relevant Orders   POCT glycosylated hemoglobin (Hb A1C) (Completed)     ASSESSMENT: 1. Type 2 DM - A1C today is 6.6  Report FBG at home is 125-135 consistently. Diabetic foot exam normal except for MTP joint pain of great toe. Getting annual eye exams.  2. Right great toe pain at MTP joint - joint is painful to touch, slightly swollen, and mildly erythematous.     PLAN: 1. T2DM - will refill metformin at current dose. Discussed adding statin therapy for elevated LDL in conjunction with history of DM.  2. Right great toe pain at MTP joint - will refer to podiatry due to interference with activity reported by patient. 3. Hyperlipidemia - will start rosuvastatin 10 mg daily and check fasting lipid/liver in 3 months.    No orders of the defined types were placed in this  encounter.   Follow-up: No follow-ups on file.  3 mo follow-up with fasting labs    Emmaline Life, RN, BSN AGPCNP Student, [employer] [facility]  Above notes reviewed and agree.   We  Addressed the following:  -Type 2 diabetes controlled with A1C today 6.6% -right MTP joint pain.  Suspect osteoarthritis.  No hx of gout  Will set up podiatry referral as pain limiting activity currently -?OSA.   Set up pulmonary referral.   -hyperlipidemia.  Discussed statin use with Rosuvastatin 10 mg daily.  Set up CPE and reassess lipids then  Eulas Post MD [facility]

## 2019-06-15 NOTE — Patient Instructions (Addendum)
Hallux Rigidus  Hallux rigidus is a type of joint pain or joint disease (arthritis) that affects your big toe (hallux). This condition involves the joint that connects the base of your big toe to the main part of your foot (metatarsophalangeal joint or MTP joint). This condition can cause your big toe to become stiff, painful, and difficult to move. Symptoms may get worse with movement or in cold or damp weather. The condition gets worse over time. What are the causes? This condition may be caused by having a foot that does not function the way that it should or that has an abnormal shape (structural deformity). These foot problems can run in families and may be passed down from parents to children (are hereditary). This condition can also be caused by:  Injury.  Overuse.  Certain inflammatory diseases, including gout and rheumatoid arthritis. What increases the risk? You are more likely to develop this condition if you have:  A foot bone (metatarsal) that is longer or higher than normal.  A family history of hallux rigidus.  Previously injured your big toe.  Feet that do not have a curve (arch) on the inner side of the foot. This may be called flat feet or fallen arches.  Ankles that turn in when you walk (pronation).  Rheumatoid arthritis or gout.  A job that requires you to stoop down often at work. What are the signs or symptoms? Symptoms of this condition include:  Big toe pain.  Stiffness and difficulty moving the big toe.  Swelling of the toe and surrounding area.   Rosuvastatin capsules What is this medicine? ROSUVASTATIN (roe SOO va sta tin) is known as an HMG-CoA reductase inhibitor or 'statin'. It lowers cholesterol and triglycerides in the blood. Diet and lifestyle changes are often used with this drug. This medicine may be used for other purposes; ask your health care provider or pharmacist if you have questions. COMMON BRAND NAME(S): Ezallor What should I tell  my health care provider before I take this medicine? They need to know if you have any of these conditions: diabetes if you often drink alcohol history of stroke kidney disease liver disease muscle aches or weakness thyroid disease an unusual or allergic reaction to rosuvastatin, other medicines, foods, dyes, or preservatives pregnant or trying to get pregnant breast-feeding How should I use this medicine? Take this medicine by mouth with a glass of water. Follow the directions on the prescription label. Swallow the capsules whole. Do not crush or chew this medicine. You may open the capsule and put the contents in 1 teaspoon of applesauce, chocolate pudding, or vanilla pudding. Swallow the medicine with applesauce or pudding within 60 minutes (1 hour). Do not chew the medicine, applesauce, or pudding. You can take this medicine with or without food. Take your doses at regular intervals. Do not take your medicine more often than directed. Talk to your pediatrician about the use of this medicine in children. Special care may be needed. Overdosage: If you think you have taken too much of this medicine contact a poison control center or emergency room at once. NOTE: This medicine is only for you. Do not share this medicine with others. What if I miss a dose? If you miss a dose, take it as soon as you can. If your next dose is to be taken in less than 12 hours, then do not take the missed dose. Take the next dose at your regular time. Do not take double or extra doses.  What may interact with this medicine? Do not take this medicine with any of the following medications: herbal medicines like red yeast rice This medicine may also interact with the following medications: alcohol antacids containing aluminum hydroxide or magnesium hydroxide cyclosporine other medicines for high cholesterol some medicines for HIV infection warfarin This list may not describe all possible interactions. Give your  health care provider a list of all the medicines, herbs, non-prescription drugs, or dietary supplements you use. Also tell them if you smoke, drink alcohol, or use illegal drugs. Some items may interact with your medicine. What should I watch for while using this medicine? Visit your doctor or health care professional for regular check-ups. You may need regular tests to make sure your liver is working properly. Your health care professional may tell you to stop taking this medicine if you develop muscle problems. If your muscle problems do not go away after stopping this medicine, contact your health care professional. Do not become pregnant while taking this medicine. Women should inform their health care professional if they wish to become pregnant or think they might be pregnant. There is a potential for serious side effects to an unborn child. Talk to your health care professional or pharmacist for more information. Do not breast-feed an infant while taking this medicine. This medicine may increase blood sugar. Ask your healthcare provider if changes in diet or medicines are needed if you have diabetes. If you are going to need surgery or other procedure, tell your doctor that you are using this medicine. This drug is only part of a total heart-health program. Your doctor or a dietician can suggest a low-cholesterol and low-fat diet to help. Avoid alcohol and smoking, and keep a proper exercise schedule. This medicine may cause a decrease in Co-Enzyme Q-10. You should make sure that you get enough Co-Enzyme Q-10 while you are taking this medicine. Discuss the foods you eat and the vitamins you take with your health care professional. What side effects may I notice from receiving this medicine? Side effects that you should report to your doctor or health care professional as soon as possible: allergic reactions like skin rash, itching or hives, swelling of the face, lips, or tongue dark  urine fever joint pain muscle cramps, pain redness, blistering, peeling or loosening of the skin, including inside the mouth signs and symptoms of high blood sugar such as being more thirsty or hungry or having to urinate more than normal. You may also feel very tired or have blurry vision. trouble passing urine or change in the amount of urine unusually weak yellowing of the eyes or skin Side effects that usually do not require medical attention (report these to your doctor or health care professional if they continue or are bothersome): constipation heartburn nausea stomach gas, pain, upset This list may not describe all possible side effects. Call your doctor for medical advice about side effects. You may report side effects to FDA at 1-800-FDA-1088. Where should I keep my medicine? Keep out of the reach of children. Store at room temperature between 20 and 25 degrees C (68 and 77 degrees F). Keep container tightly closed (protect from moisture). Throw away any unused medicine after the expiration date. NOTE: This sheet is a summary. It may not cover all possible information. If you have questions about this medicine, talk to your doctor, pharmacist, or health care provider.  2020 Elsevier/Gold Standard (2018-08-08 10:34:28)  Bone spurs. These are bony growths that can form on the  joint of the big toe.  A limp. How is this diagnosed? This condition is diagnosed based on your medical history and a physical exam. You may also have X-rays. How is this treated? This condition is treated by:  Wearing roomy, comfortable shoes that have a large toe box.  Putting orthotic devices in your shoes.  Taking pain medicines.  Having physical therapy.  Icing the injured area.  Alternating between putting your foot in cold water and then in warm water. If your condition is severe, treatment may include:  Corticosteroid injections to relieve pain.  Surgery to remove bone spurs, fuse  damaged bones together, or replace the entire joint. Follow these instructions at home: Managing pain, stiffness, and swelling   Put your feet in cold water for 30 seconds, and then in warm water for 30 seconds. Alternate between the cold and warm water for 5 minutes. Do this several times a day or as told by your health care provider.  If directed, put ice on the injured area. ? Put ice in a plastic bag. ? Place a towel between your skin and the bag. ? Leave the ice on for 20 minutes, 2-3 times a day. General instructions  Take over-the-counter and prescription medicines only as told by your health care provider.  Do not wear high heels or other restrictive footwear. Wear comfortable, supportive shoes that have a large toe box.  Wear shoe inserts (orthotics) as told by your health care provider, if this applies.  Do foot exercises as instructed by your health care provider or a physical therapist.  Keep all follow-up visits as told by your health care provider. This is important. Contact a health care provider if:  You notice bone spurs or growths on or around your big toe.  Your pain does not get better or it gets worse.  You have pain while resting.  You have pain in other parts of your body, such as your back, hip, or knee.  You start to limp. Summary  Hallux rigidus is a condition that makes your big toe become stiff, painful, and difficult to move.  It can be caused by injury, overuse, or inflammatory diseases.  This condition may be treated with ice, medicines, physical therapy, and surgery.  Do not wear high heels or other restrictive footwear. Wear comfortable, supportive shoes that have a large toe box. This information is not intended to replace advice given to you by your health care provider. Make sure you discuss any questions you have with your health care provider. Document Revised: 01/13/2018 Document Reviewed: 01/16/2018 Elsevier Patient Education  [place].   Sleep Apnea Sleep apnea is a condition in which breathing pauses or becomes shallow during sleep. Episodes of sleep apnea usually last 10 seconds or longer, and they may occur as many as 20 times an hour. Sleep apnea disrupts your sleep and keeps your body from getting the rest that it needs. This condition can increase your risk of certain health problems, including:  Heart attack.  Stroke.  Obesity.  Diabetes.  Heart failure.  Irregular heartbeat. What are the causes? There are three kinds of sleep apnea:  Obstructive sleep apnea. This kind is caused by a blocked or collapsed airway.  Central sleep apnea. This kind happens when the part of the brain that controls breathing does not send the correct signals to the muscles that control breathing.  Mixed sleep apnea. This is a combination of obstructive and central sleep apnea. The most  common cause of this condition is a collapsed or blocked airway. An airway can collapse or become blocked if:  Your throat muscles are abnormally relaxed.  Your tongue and tonsils are larger than normal.  You are overweight.  Your airway is smaller than normal. What increases the risk? You are more likely to develop this condition if you:  Are overweight.  Smoke.  Have a smaller than normal airway.  Are elderly.  Are male.  Drink alcohol.  Take sedatives or tranquilizers.  Have a family history of sleep apnea. What are the signs or symptoms? Symptoms of this condition include:  Trouble staying asleep.  Daytime sleepiness and tiredness.  Irritability.  Loud snoring.  Morning headaches.  Trouble concentrating.  Forgetfulness.  Decreased interest in sex.  Unexplained sleepiness.  Mood swings.  Personality changes.  Feelings of depression.  Waking up often during the night to urinate.  Dry mouth.  Sore throat. How is this diagnosed? This condition may be diagnosed with:  A medical  history.  A physical exam.  A series of tests that are done while you are sleeping (sleep study). These tests are usually done in a sleep lab, but they may also be done at home. How is this treated? Treatment for this condition aims to restore normal breathing and to ease symptoms during sleep. It may involve managing health issues that can affect breathing, such as high blood pressure or obesity. Treatment may include:  Sleeping on your side.  Using a decongestant if you have nasal congestion.  Avoiding the use of depressants, including alcohol, sedatives, and narcotics.  Losing weight if you are overweight.  Making changes to your diet.  Quitting smoking.  Using a device to open your airway while you sleep, such as: ? An oral appliance. This is a custom-made mouthpiece that shifts your lower jaw forward. ? A continuous positive airway pressure (CPAP) device. This device blows air through a mask when you breathe out (exhale). ? A nasal expiratory positive airway pressure (EPAP) device. This device has valves that you put into each nostril. ? A bi-level positive airway pressure (BPAP) device. This device blows air through a mask when you breathe in (inhale) and breathe out (exhale).  Having surgery if other treatments do not work. During surgery, excess tissue is removed to create a wider airway. It is important to get treatment for sleep apnea. Without treatment, this condition can lead to:  High blood pressure.  Coronary artery disease.  In men, an inability to achieve or maintain an erection (impotence).  Reduced thinking abilities. Follow these instructions at home: Lifestyle  Make any lifestyle changes that your health care provider recommends.  Eat a healthy, well-balanced diet.  Take steps to lose weight if you are overweight.  Avoid using depressants, including alcohol, sedatives, and narcotics.  Do not use any products that contain nicotine or tobacco, such as  cigarettes, e-cigarettes, and chewing tobacco. If you need help quitting, ask your health care provider. General instructions  Take over-the-counter and prescription medicines only as told by your health care provider.  If you were given a device to open your airway while you sleep, use it only as told by your health care provider.  If you are having surgery, make sure to tell your health care provider you have sleep apnea. You may need to bring your device with you.  Keep all follow-up visits as told by your health care provider. This is important. Contact a health care provider if:  The device that you received to open your airway during sleep is uncomfortable or does not seem to be working.  Your symptoms do not improve.  Your symptoms get worse. Get help right away if:  You develop: ? Chest pain. ? Shortness of breath. ? Discomfort in your back, arms, or stomach.  You have: ? Trouble speaking. ? Weakness on one side of your body. ? Drooping in your face. These symptoms may represent a serious problem that is an emergency. Do not wait to see if the symptoms will go away. Get medical help right away. Call your local emergency services (911 in the [place] Do not drive yourself to the hospital. Summary  Sleep apnea is a condition in which breathing pauses or becomes shallow during sleep.  The most common cause is a collapsed or blocked airway.  The goal of treatment is to restore normal breathing and to ease symptoms during sleep. This information is not intended to replace advice given to you by your health care provider. Make sure you discuss any questions you have with your health care provider. Document Revised: 09/20/2018 Document Reviewed: 11/29/2017 Elsevier Patient Education  [place] up physical for 6-8 weeks.

## 2019-06-28 ENCOUNTER — Ambulatory Visit: Payer: 59 | Attending: Internal Medicine

## 2019-06-28 DIAGNOSIS — Z23 Encounter for immunization: Secondary | ICD-10-CM

## 2019-06-28 NOTE — Progress Notes (Signed)
   Covid-19 Vaccination Clinic  Name:  Spencer Duffy    MRN: [id number] DOB: 08/25/1965  06/28/2019  Mr. [name] was observed post Covid-19 immunization for 15 minutes without incident. He was provided with Vaccine Information Sheet and instruction to access the V-Safe system.   Mr. [name] was instructed to call 911 with any severe reactions post vaccine: Marland Kitchen Difficulty breathing  . Swelling of face and throat  . A fast heartbeat  . A bad rash all over body  . Dizziness and weakness   Immunizations Administered    Name Date Dose VIS Date Route   Pfizer COVID-19 Vaccine 06/28/2019  1:02 PM 0.3 mL 03/30/2019 Intramuscular   Manufacturer: [employer]   Lot: [id number]   [place]: [id number]

## 2019-06-29 ENCOUNTER — Ambulatory Visit (INDEPENDENT_AMBULATORY_CARE_PROVIDER_SITE_OTHER): Payer: 59 | Admitting: Podiatry

## 2019-06-29 ENCOUNTER — Other Ambulatory Visit: Payer: Self-pay

## 2019-06-29 ENCOUNTER — Ambulatory Visit (INDEPENDENT_AMBULATORY_CARE_PROVIDER_SITE_OTHER): Payer: 59

## 2019-06-29 ENCOUNTER — Encounter: Payer: Self-pay | Admitting: Podiatry

## 2019-06-29 VITALS — BP 123/70 | HR 74 | Temp 97.4°F | Resp 16

## 2019-06-29 DIAGNOSIS — M779 Enthesopathy, unspecified: Secondary | ICD-10-CM

## 2019-06-29 DIAGNOSIS — M2021 Hallux rigidus, right foot: Secondary | ICD-10-CM

## 2019-06-29 MED ORDER — DICLOFENAC SODIUM 1 % EX GEL: 2.0000 g | Freq: Four times a day (QID) | CUTANEOUS | 2 refills | Status: DC

## 2019-06-29 NOTE — Patient Instructions (Signed)
Pre-Operative Instructions  Congratulations, you have decided to take an important step towards improving your quality of life.  You can be assured that the doctors and staff at [facility] will be with you every step of the way.  Here are some important things you should know:  1. Plan to be at the surgery center/hospital at least 1 (one) hour prior to your scheduled time, unless otherwise directed by the surgical center/hospital staff.  You must have a responsible adult accompany you, remain during the surgery and drive you home.  Make sure you have directions to the surgical center/hospital to ensure you arrive on time. 2. If you are having surgery at [facility] or [facility] hospitals, you will need a copy of your medical history and physical form from your family physician within one month prior to the date of surgery. We will give you a form for your primary physician to complete.  3. We make every effort to accommodate the date you request for surgery.  However, there are times where surgery dates or times have to be moved.  We will contact you as soon as possible if a change in schedule is required.   4. No aspirin/ibuprofen for one week before surgery.  If you are on aspirin, any non-steroidal anti-inflammatory medications (Mobic, Aleve, Ibuprofen) should not be taken seven (7) days prior to your surgery.  You make take Tylenol for pain prior to surgery.  5. Medications - If you are taking daily heart and blood pressure medications, seizure, reflux, allergy, asthma, anxiety, pain or diabetes medications, make sure you notify the surgery center/hospital before the day of surgery so they can tell you which medications you should take or avoid the day of surgery. 6. No food or drink after midnight the night before surgery unless directed otherwise by surgical center/hospital staff. 7. No alcoholic beverages 24-hours prior to surgery.  No smoking 24-hours prior or 24-hours after  surgery. 8. Wear loose pants or shorts. They should be loose enough to fit over bandages, boots, and casts. 9. Don't wear slip-on shoes. Sneakers are preferred. 10. Bring your boot with you to the surgery center/hospital.  Also bring crutches or a walker if your physician has prescribed it for you.  If you do not have this equipment, it will be provided for you after surgery. 11. If you have not been contacted by the surgery center/hospital by the day before your surgery, call to confirm the date and time of your surgery. 12. Leave-time from work may vary depending on the type of surgery you have.  Appropriate arrangements should be made prior to surgery with your employer. 13. Prescriptions will be provided immediately following surgery by your doctor.  Fill these as soon as possible after surgery and take the medication as directed. Pain medications will not be refilled on weekends and must be approved by the doctor. 14. Remove nail polish on the operative foot and avoid getting pedicures prior to surgery. 15. Wash the night before surgery.  The night before surgery wash the foot and leg well with water and the antibacterial soap provided. Be sure to pay special attention to beneath the toenails and in between the toes.  Wash for at least three (3) minutes. Rinse thoroughly with water and dry well with a towel.  Perform this wash unless told not to do so by your physician.  Enclosed: 1 Ice pack (please put in freezer the night before surgery)   1 Hibiclens skin cleaner     Pre-op instructions  If you have any questions regarding the instructions, please do not hesitate to call our office.  [place]: [address], [place], [place] [address] -- [phone]  [place]: [address] [place], [place] [address] -- [phone]  [place]: [address], [place], [place] [address] -- [phone]   Website: [email or web address] 

## 2019-07-03 NOTE — Progress Notes (Signed)
Subjective:   Patient ID: Spencer Duffy, male   DOB: 54 y.o.   MRN: [id number]   HPI 54 year old male presents the office today for concerns of pain to his right foot on the big toe joint.  He states the toe does not flex and when he tries to bend the toe he has constant pain to the joint.  His pain levels 8-9/10 when moving the foot.  He has a history of a fracture about 13 years ago.  He states that the pain which has been getting worse the last 2 to 3 years.  He is diabetic and his last A1c was 6.6.  He has tried changing shoes without any significant improvement.   Review of Systems  All other systems reviewed and are negative.  Past Medical History:  Diagnosis Date  . Arthritis   . Diabetes mellitus    TYPE II  . GERD (gastroesophageal reflux disease)   . Hay fever    ALLERGIES  . Heart murmur     Past Surgical History:  Procedure Laterality Date  . BILATERAL KNEE ARTHROSCOPY     X2  . ELBOW ARTHROSCOPY WITH TENDON RECONSTRUCTION Right 12/22/2016  . Neuroplasty Digital Nerve Right 06/21/2013   @ [place]  . ROTATOR CUFF REPAIR Bilateral   . SKIN CANCER EXCISION     ON CHEST  . Tarsal Exostectomy Right 06/21/2013   @ [facility]     Current Outpatient Medications:  .  Eszopiclone 3 MG TABS, TAKE 1 TABLET BY MOUTH IMMEDIATELY BEFORE BEDTIME AS NEEDED, Disp: 30 tablet, Rfl: 1 .  metFORMIN (GLUCOPHAGE) 500 MG tablet, TAKE 1 TABLET TWICE A DAY  WITH MEALS, Disp: 180 tablet, Rfl: 3 .  Multiple Vitamin (MULTIVITAMIN) tablet, Take 1 tablet by mouth daily., Disp: , Rfl:  .  ondansetron (ZOFRAN ODT) 8 MG disintegrating tablet, Take 1 tablet (8 mg total) by mouth every 8 (eight) hours as needed for nausea or vomiting., Disp: 15 tablet, Rfl: 0 .  ramipril (ALTACE) 5 MG capsule, Take 1 capsule (5 mg total) by mouth daily., Disp: 90 capsule, Rfl: 3 .  rosuvastatin (CRESTOR) 10 MG tablet, Take 1 tablet (10 mg total) by mouth daily., Disp: 90 tablet, Rfl: 3 .  diclofenac Sodium (VOLTAREN) 1 % GEL, Apply  2 g topically 4 (four) times daily. Rub into affected area of foot 2 to 4 times daily, Disp: 100 g, Rfl: 2  No Known Allergies       Objective:  Physical Exam  General: AAO x3, NAD  Dermatological: Skin is warm, dry and supple bilateral. There are no open sores, no preulcerative lesions, no rash or signs of infection present.  Vascular: Dorsalis Pedis artery and Posterior Tibial artery pedal pulses are 2/4 bilateral with immedate capillary fill time. Pedal hair growth present. No varicosities and no lower extremity edema present bilateral. There is no pain with calf compression, swelling, warmth, erythema.   Neruologic: Grossly intact via light touch bilateral.  Musculoskeletal: There is decreased range of motion of the right first MPJ with crepitation with range of motion.  Mild edema but there is no erythema or warmth.  Tenderness palpation directly along the dorsal joint as well as the medial aspect of the first MPJ.  There is no other areas of discomfort identified today.  Muscular strength 5/5 in all groups tested bilateral.  Gait: Unassisted, Nonantalgic.       Assessment:   Hallux rigidus right foot with capsulitis     Plan:  -  Treatment options discussed including all alternatives, risks, and complications -Etiology of symptoms were discussed -X-rays were ordered and independently reviewed.  Arthritic changes present the first MPJ. -We discussed both conservative as well as surgical treatment options.  Conservatively discussed steroid injection, anti-inflammatories and orthotics.  Surgically we discussed options including MPJ arthroplasty with implant versus arthrodesis.  After long discussion given his pain needs already tried changing shoes without any significant improvement For surgical intervention.  He is quite active.  We discussed MPJ arthrodesis.  He is in agreement to this and wishes to proceed. -The incision placement as well as the postoperative course was  discussed with the patient. I discussed risks of the surgery which include, but not limited to, infection, bleeding, pain, swelling, need for further surgery, delayed or nonhealing, painful or ugly scar, numbness or sensation changes, over/under correction, recurrence, transfer lesions, further deformity, hardware failure, DVT/PE, loss of toe/foot. Patient understands these risks and wishes to proceed with surgery. The surgical consent was reviewed with the patient all 3 pages were signed. No promises or guarantees were given to the outcome of the procedure. All questions were answered to the best of my ability. Before the surgery the patient was encouraged to call the office if there is any further questions. The surgery will be performed at the [facility] on an outpatient basis. -CAM boot dispensed for postoperative use  Trula Slade DPM

## 2019-07-09 ENCOUNTER — Ambulatory Visit (INDEPENDENT_AMBULATORY_CARE_PROVIDER_SITE_OTHER): Payer: 59 | Admitting: Family Medicine

## 2019-07-09 ENCOUNTER — Inpatient Hospital Stay: Admission: RE | Admit: 2019-07-09 | Payer: 59 | Source: Ambulatory Visit

## 2019-07-09 ENCOUNTER — Encounter: Payer: Self-pay | Admitting: Family Medicine

## 2019-07-09 ENCOUNTER — Telehealth: Payer: Self-pay | Admitting: Family Medicine

## 2019-07-09 ENCOUNTER — Telehealth: Payer: 59 | Admitting: Family Medicine

## 2019-07-09 ENCOUNTER — Other Ambulatory Visit: Payer: Self-pay

## 2019-07-09 ENCOUNTER — Ambulatory Visit
Admission: RE | Admit: 2019-07-09 | Discharge: 2019-07-09 | Disposition: A | Discharge status: Home / Self Care | Payer: 59 | Source: Ambulatory Visit | Attending: Family Medicine | Admitting: Family Medicine

## 2019-07-09 VITALS — BP 120/68 | HR 82 | Temp 98.3°F | Wt 208.2 lb

## 2019-07-09 DIAGNOSIS — R1031 Right lower quadrant pain: Secondary | ICD-10-CM | POA: Diagnosis not present

## 2019-07-09 LAB — CBC WITH DIFFERENTIAL/PLATELET
Basophils Absolute: 0 10*3/uL (ref 0.0–0.1)
Basophils Relative: 0.2 % (ref 0.0–3.0)
Eosinophils Absolute: 0 10*3/uL (ref 0.0–0.7)
Eosinophils Relative: 0.4 % (ref 0.0–5.0)
HCT: 41.8 % (ref 39.0–52.0)
Hemoglobin: 14.1 g/dL (ref 13.0–17.0)
Lymphocytes Relative: 28.5 % (ref 12.0–46.0)
Lymphs Abs: 1.8 10*3/uL (ref 0.7–4.0)
MCHC: 33.8 g/dL (ref 30.0–36.0)
MCV: 91.1 fl (ref 78.0–100.0)
Monocytes Absolute: 0.4 10*3/uL (ref 0.1–1.0)
Monocytes Relative: 6.3 % (ref 3.0–12.0)
Neutro Abs: 4.2 10*3/uL (ref 1.4–7.7)
Neutrophils Relative %: 64.6 % (ref 43.0–77.0)
Platelets: 277 10*3/uL (ref 150.0–400.0)
RBC: 4.58 Mil/uL (ref 4.22–5.81)
RDW: 12.9 % (ref 11.5–15.5)
WBC: 6.5 10*3/uL (ref 4.0–10.5)

## 2019-07-09 LAB — BASIC METABOLIC PANEL
BUN: 17 mg/dL (ref 6–23)
CO2: 28 mEq/L (ref 19–32)
Calcium: 9.3 mg/dL (ref 8.4–10.5)
Chloride: 102 mEq/L (ref 96–112)
Creatinine, Ser: 0.86 mg/dL (ref 0.40–1.50)
GFR: 93.17 mL/min (ref >60.00)
Glucose, Bld: 128 mg/dL — ABNORMAL HIGH (ref 70–99)
Potassium: 4.5 mEq/L (ref 3.5–5.1)
Sodium: 138 mEq/L (ref 135–145)

## 2019-07-09 LAB — URINALYSIS
Bilirubin Urine: NEGATIVE
Hgb urine dipstick: NEGATIVE
Ketones, ur: NEGATIVE
Leukocytes,Ua: NEGATIVE
Nitrite: NEGATIVE
Specific Gravity, Urine: 1.01 (ref 1.000–1.030)
Total Protein, Urine: NEGATIVE
Urine Glucose: NEGATIVE
Urobilinogen, UA: 0.2 (ref 0.0–1.0)
pH: 6 (ref 5.0–8.0)

## 2019-07-09 MED ORDER — IOPAMIDOL (ISOVUE-300) INJECTION 61%
100.0000 mL | Freq: Once | INTRAVENOUS | Status: AC | PRN
  Administered 2019-07-09: 100 mL via INTRAVENOUS

## 2019-07-09 NOTE — Patient Instructions (Signed)
Lab work and CT to evaluate for appendicitis  Await call back with test results  Do not eat or drink anything until you hear back from [place]

## 2019-07-09 NOTE — Progress Notes (Signed)
Acute Office Visit  Subjective:    Patient ID: Spencer Duffy, male    DOB: 13-Mar-1966, 54 y.o.   MRN: [id number]  Chief Complaint  Patient presents with  . Abdominal Pain    pt states it got really bad on saturday and is also having a shooting oain go into his back pt describes right abdomen pain as achy     HPI   Patient is in today for pain radiating from his low back around to right lower abdomen that has been occurring intermittently for 2-3 months but became severe on Saturday. He reports dull pain that is constant but he has intermittent bouts of sharp pain. He denies hx of kidney states and denies urinary symptoms. States he has had decreased appetite, chills, mildly elevated temperature of 99.4 F, nausea since Saturday. Tried Tylenol with no relief. Reports he has had discomfort in this area in the past 2-3 months, but it would always resolve on its own. He states his discomfort has worsened since Saturday and continues today.  Past Medical History:  Diagnosis Date  . Arthritis   . Diabetes mellitus    TYPE II  . GERD (gastroesophageal reflux disease)   . Hay fever    ALLERGIES  . Heart murmur     Past Surgical History:  Procedure Laterality Date  . BILATERAL KNEE ARTHROSCOPY     X2  . ELBOW ARTHROSCOPY WITH TENDON RECONSTRUCTION Right 12/22/2016  . Neuroplasty Digital Nerve Right 06/21/2013   @ [place]  . ROTATOR CUFF REPAIR Bilateral   . SKIN CANCER EXCISION     ON CHEST  . Tarsal Exostectomy Right 06/21/2013   @ [place]    Family History  Problem Relation Age of Onset  . Arthritis Other   . Hypertension Other   . Diabetes Other        PARENT, GRANDPARENT  . Lung cancer Father   . Heart attack Father   . Colon cancer Paternal Grandmother        74's  . Diabetes Paternal Grandmother   . Diabetes Sister   . Head & neck cancer Brother   . Diabetes Brother   . Diabetes Maternal Grandmother   . Stroke Paternal Grandfather     Social History   Socioeconomic History   . Marital status: Married    Spouse name: Not on file  . Number of children: Not on file  . Years of education: Not on file  . Highest education level: Not on file  Occupational History    Employer: [employer]  Tobacco Use  . Smoking status: Never Smoker  . Smokeless tobacco: Former Network engineer and Sexual Activity  . Alcohol use: Yes    Comment: wine occ   . Drug use: No  . Sexual activity: Not on file  Other Topics Concern  . Not on file  Social History Narrative  . Not on file   Social Determinants of Health   Financial Resource Strain:   . Difficulty of Paying Living Expenses:   Food Insecurity:   . Worried About Charity fundraiser in the Last Year:   . Arboriculturist in the Last Year:   Transportation Needs:   . Film/video editor (Medical):   Marland Kitchen Lack of Transportation (Non-Medical):   Physical Activity:   . Days of Exercise per Week:   . Minutes of Exercise per Session:   Stress:   . Feeling of Stress :   Social  Connections:   . Frequency of Communication with Friends and Family:   . Frequency of Social Gatherings with Friends and Family:   . Attends Religious Services:   . Active Member of Clubs or Organizations:   . Attends Archivist Meetings:   Marland Kitchen Marital Status:   Intimate Partner Violence:   . Fear of Current or Ex-Partner:   . Emotionally Abused:   Marland Kitchen Physically Abused:   . Sexually Abused:     Outpatient Medications Prior to Visit  Medication Sig Dispense Refill  . diclofenac Sodium (VOLTAREN) 1 % GEL Apply 2 g topically 4 (four) times daily. Rub into affected area of foot 2 to 4 times daily 100 g 2  . Eszopiclone 3 MG TABS TAKE 1 TABLET BY MOUTH IMMEDIATELY BEFORE BEDTIME AS NEEDED 30 tablet 1  . metFORMIN (GLUCOPHAGE) 500 MG tablet TAKE 1 TABLET TWICE A DAY  WITH MEALS 180 tablet 3  . Multiple Vitamin (MULTIVITAMIN) tablet Take 1 tablet by mouth daily.    . ondansetron (ZOFRAN ODT) 8 MG disintegrating tablet Take 1 tablet (8 mg  total) by mouth every 8 (eight) hours as needed for nausea or vomiting. 15 tablet 0  . ramipril (ALTACE) 5 MG capsule Take 1 capsule (5 mg total) by mouth daily. 90 capsule 3  . rosuvastatin (CRESTOR) 10 MG tablet Take 1 tablet (10 mg total) by mouth daily. 90 tablet 3   No facility-administered medications prior to visit.    No Known Allergies  Review of Systems  Constitutional: Positive for appetite change and fever.  HENT: Negative for congestion, rhinorrhea, sore throat and trouble swallowing.   Eyes: Negative.   Respiratory: Negative for chest tightness and shortness of breath.   Cardiovascular: Negative for chest pain, palpitations and leg swelling.  Gastrointestinal: Positive for abdominal pain and nausea. Negative for abdominal distention, blood in stool, constipation, diarrhea, rectal pain and vomiting.  Endocrine: Negative.   Genitourinary: Negative for dysuria, frequency, penile pain, scrotal swelling, testicular pain and urgency.  Musculoskeletal: Negative for back pain.  Skin: Negative for color change and pallor.  Allergic/Immunologic: Negative.   Neurological: Negative for dizziness, weakness and headaches.  Hematological: Negative for adenopathy. Does not bruise/bleed easily.  Psychiatric/Behavioral: Negative.        Objective:    Physical Exam Constitutional:      Appearance: He is well-developed. He is not toxic-appearing.  HENT:     Head: Normocephalic and atraumatic.     Mouth/Throat:     Mouth: Mucous membranes are moist.     Pharynx: Oropharynx is clear.  Eyes:     Extraocular Movements: Extraocular movements intact.  Cardiovascular:     Rate and Rhythm: Normal rate and regular rhythm.     Heart sounds: Normal heart sounds.  Pulmonary:     Effort: Pulmonary effort is normal.     Breath sounds: Normal breath sounds.  Abdominal:     General: Bowel sounds are normal. There is no distension.     Palpations: Abdomen is soft.     Tenderness: There is  abdominal tenderness in the right lower quadrant. There is guarding. There is no right CVA tenderness or left CVA tenderness.     Hernia: There is no hernia in the umbilical area.  Skin:    General: Skin is warm and dry.  Neurological:     General: No focal deficit present.     Mental Status: He is alert and oriented to person, place, and time.  Psychiatric:  Mood and Affect: Mood normal.        Behavior: Behavior normal.     BP 120/68 (BP Location: Right Arm, Patient Position: Sitting, Cuff Size: Normal)   Pulse 82   Temp 98.3 F (36.8 C) (Temporal)   Wt 208 lb 3.2 oz (94.4 kg)   SpO2 99%   BMI 30.97 kg/m  Wt Readings from Last 3 Encounters:  07/09/19 208 lb 3.2 oz (94.4 kg)  06/15/19 206 lb 12.8 oz (93.8 kg)  05/19/18 203 lb 3.2 oz (92.2 kg)    Health Maintenance Due  Topic Date Due  . FOOT EXAM  08/10/2013    There are no preventive care reminders to display for this patient.   Lab Results  Component Value Date   TSH 2.02 04/07/2018   Lab Results  Component Value Date   WBC 6.8 04/07/2018   HGB 14.2 04/07/2018   HCT 40.7 04/07/2018   MCV 87.7 04/07/2018   PLT 303 04/07/2018   Lab Results  Component Value Date   NA 139 04/07/2018   K 3.9 04/07/2018   CO2 27 04/07/2018   GLUCOSE 120 (H) 04/07/2018   BUN 17 04/07/2018   CREATININE 1.00 04/07/2018   BILITOT 0.5 04/07/2018   ALKPHOS 72 12/01/2016   AST 16 04/07/2018   ALT 19 04/07/2018   PROT 7.1 04/07/2018   ALBUMIN 4.7 12/01/2016   CALCIUM 9.5 04/07/2018   GFR 101.41 12/01/2016   Lab Results  Component Value Date   CHOL 187 04/07/2018   Lab Results  Component Value Date   HDL 41 04/07/2018   Lab Results  Component Value Date   LDLCALC 128 (H) 04/07/2018   Lab Results  Component Value Date   TRIG 79 04/07/2018   Lab Results  Component Value Date   CHOLHDL 4.6 04/07/2018   Lab Results  Component Value Date   HGBA1C 6.6 (A) 06/15/2019       Assessment & Plan:   Problem  List Items Addressed This Visit    None     ASSESSMENT: Abdominal tenderness/guarding in RLQ along with nausea, decreased appetite, chills suspicious for appendicitis.    PLAN: CT Abdomen/pelvis with contrast ordered STAT CBC w/diff, urinalysis,  and BMET ordered STAT Patient advised to complete these tests and remain NPO until further advisement from our office with results. Will await test results for next steps. Patient already has an appointment for his annual physical scheduled for May.   No orders of the defined types were placed in this encounter.    Lanice Schwab Suraj Ramdass, RN, BSN AGPCNP Student, [employer] [facility]  As above,background of some flank pain but rather acute symptoms over weekend of chills, decreased appetite, right lower quadrant pain.  setting up CT abdomen pelvis stat to further assess along with UA, CBC with dif and BMP.    Eulas Post MD [facility]

## 2019-07-09 NOTE — Telephone Encounter (Signed)
Pt is wondering when he will receive his results for the CT scan he did today. Pt wants to speak to someone before they leave today.   Informed pt that I can not promise he will receive a call today, he wanted to hold until CMA is available but call dropped and new call came in before reaching back out

## 2019-07-19 ENCOUNTER — Telehealth: Payer: Self-pay

## 2019-07-19 NOTE — Telephone Encounter (Signed)
DOS 08/08/2019  HALLUX MPJ FUSION RT - 28750  [facility] EFFECTIVE DATE - 04/20/2019  PLAN DEDUCTIBLE - $750 W/ $248.25 REMAINING OUT OF POCKET - $2500 W/ [id number] REMAINING COPAY $0.00 COINSURANCE - 20%  [place] # [id number] GOOD FROM 08/08/2019 - 11/06/2019

## 2019-07-23 ENCOUNTER — Ambulatory Visit: Payer: 59 | Attending: Internal Medicine

## 2019-07-23 DIAGNOSIS — Z23 Encounter for immunization: Secondary | ICD-10-CM

## 2019-07-23 NOTE — Progress Notes (Signed)
   Covid-19 Vaccination Clinic  Name:  Spencer Duffy    MRN: [id number] DOB: May 16, 1965  07/23/2019  Spencer Duffy was observed post Covid-19 immunization for 15 minutes without incident. He was provided with Vaccine Information Sheet and instruction to access the V-Safe system.   Spencer Duffy was instructed to call 911 with any severe reactions post vaccine: Marland Kitchen Difficulty breathing  . Swelling of face and throat  . A fast heartbeat  . A bad rash all over body  . Dizziness and weakness   Immunizations Administered    Name Date Dose VIS Date Route   Pfizer COVID-19 Vaccine 07/23/2019  9:49 AM 0.3 mL 03/30/2019 Intramuscular   Manufacturer: [place]   Lot: [id number]   [place]: [id number]

## 2019-08-02 ENCOUNTER — Other Ambulatory Visit: Payer: Self-pay | Admitting: Family Medicine

## 2019-08-08 ENCOUNTER — Encounter: Payer: Self-pay | Admitting: Podiatry

## 2019-08-08 ENCOUNTER — Other Ambulatory Visit: Payer: Self-pay | Admitting: Podiatry

## 2019-08-08 DIAGNOSIS — M2021 Hallux rigidus, right foot: Secondary | ICD-10-CM

## 2019-08-08 MED ORDER — CEPHALEXIN 500 MG PO CAPS: 500.0000 mg | ORAL_CAPSULE | Freq: Three times a day (TID) | ORAL | 0 refills | Status: DC

## 2019-08-08 MED ORDER — PROMETHAZINE HCL 25 MG PO TABS: 25.0000 mg | ORAL_TABLET | Freq: Three times a day (TID) | ORAL | 0 refills | Status: DC | PRN

## 2019-08-08 MED ORDER — OXYCODONE-ACETAMINOPHEN 5-325 MG PO TABS: 1.0000 | ORAL_TABLET | Freq: Four times a day (QID) | ORAL | 0 refills | Status: DC | PRN

## 2019-08-08 NOTE — Progress Notes (Signed)
Postop medications sent 

## 2019-08-09 ENCOUNTER — Telehealth: Payer: Self-pay | Admitting: *Deleted

## 2019-08-09 NOTE — Telephone Encounter (Signed)
Called and spoke with the patient and patient stated that pain was ok and the pain scale was about a 6 or 7 and the nerve block is wearing off and the feeling is coming back and the pain medicine does not do anything for me and I have been using the ibuprofen and it helps a little bit and I would like to have nucynta cause the patient has had this in the past and has worked in the past and there has been not any nausea or fever or chills and I stated to the patient that I would ask Dr Jacqualyn Posey about the pain medicine and call the patient back and if any concerns or questions to call the [place] office [phone]. Lattie Haw

## 2019-08-10 ENCOUNTER — Telehealth: Payer: Self-pay | Admitting: Podiatry

## 2019-08-10 MED ORDER — TAPENTADOL HCL 50 MG PO TABS: 50.0000 mg | ORAL_TABLET | Freq: Four times a day (QID) | ORAL | 0 refills | Status: DC | PRN

## 2019-08-10 NOTE — Telephone Encounter (Signed)
I spoke with Dr. Leigh Aurora assistant, L. Cox, CMA and she states she is working with Dr. Jacqualyn Posey and Dr. Posey Pronto for the [facility]. I called pt and informed of the current status of the medication and pt states understanding.

## 2019-08-10 NOTE — Telephone Encounter (Signed)
Please call patient back- he is in severe pain. Needs pain meds

## 2019-08-10 NOTE — Telephone Encounter (Addendum)
Pt states the nerve block wore off at 3:00am and he has been hurting ever since with sharp pain, and percocet doesn't work for him. I told pt we should try other pain relief options and instructed pt to remove the boot, open ended sock and ace wrap only, elevate the foot for 15 minutes but it pain worsened dangle the foot for 15 minutes, after 15 minutes either way, place foot level with the hip and beginning at the toes roll the ace just snug enough to hold the ace in place and roll down the foot and up the leg replace the sock and boot. Pt states Nucynta was given after shoulder surgery and it helped. Pt states he didn't want to hurt all weekend. I told pt I would speak with one of the doctors and call again.

## 2019-08-10 NOTE — Addendum Note (Signed)
Addended by: Boneta Lucks on: 08/10/2019 03:50 PM   Modules accepted: Orders

## 2019-08-14 ENCOUNTER — Ambulatory Visit (INDEPENDENT_AMBULATORY_CARE_PROVIDER_SITE_OTHER): Payer: 59 | Admitting: Podiatrist

## 2019-08-14 ENCOUNTER — Ambulatory Visit (INDEPENDENT_AMBULATORY_CARE_PROVIDER_SITE_OTHER): Payer: 59

## 2019-08-14 ENCOUNTER — Encounter: Payer: Self-pay | Admitting: Podiatrist

## 2019-08-14 ENCOUNTER — Other Ambulatory Visit: Payer: Self-pay

## 2019-08-14 DIAGNOSIS — M2021 Hallux rigidus, right foot: Secondary | ICD-10-CM

## 2019-08-14 DIAGNOSIS — Z9889 Other specified postprocedural states: Secondary | ICD-10-CM

## 2019-08-14 MED ORDER — TAPENTADOL HCL 50 MG PO TABS: 50.0000 mg | ORAL_TABLET | Freq: Four times a day (QID) | ORAL | 0 refills | Status: DC | PRN

## 2019-08-14 NOTE — Progress Notes (Signed)
Chief Complaint  Patient presents with  . Routine Post Op    i am doing ok and the crutches i just use for when i go out and i do sleep with the boot on      Subjective: Patient presents today1 week status post foot surgery of the right foot- first MPJ arthrodesis-  Date of surgery 08/08/2019.     Patient denies nausea, vomiting, fevers, chills or night sweats.  Denies calf pain or tenderness to the operative side. He uses crutches for stabilizing himself when out and about.    Objective:  Neurovascular status is intact with palpable pedal pulses DP and PT bilateral at 2+ out of 4. Neurological sensation is intact and unchanged as per prior to surgery. Excellent appearance of the postoperative foot is noted.  Dressing is intact upon presenting to the office and once removed sutures are noted to be in place with incision site well coapted.  Mild swelling and ecchymosis present. Some bloody drainage from between the sutures is also seen.  No pus or pirulence, no malodor, no calf pain with compression.   Xrays show a well healing surgical foot.  Good alignment and position noted of the plate and screw fixation noted.   Assessment: Status post first mpj arthrodesis dos 08/08/19  Plan: Applied a dry and sterile dressing to the foot and instructed him to keep it dry.  The boot was adjusted as it was rubbing on the posterior heel and padding was applied.  Also a prescription for Nucynta was written for him at today's visit as well.  He will continue to wear the boot and keep the foot dry and will be seen back in a week for follow-up if any concerns arise prior to that visit he is instructed to call immediately.

## 2019-08-14 NOTE — Patient Instructions (Signed)
Continue wearing the boot when on the foot and keep weight off the foot with the crutches.  Elevate the foot as much as possible during the day when relaxing.  Ice is also helpful for the swelling.

## 2019-08-23 ENCOUNTER — Other Ambulatory Visit: Payer: Self-pay

## 2019-08-23 ENCOUNTER — Encounter: Payer: Self-pay | Admitting: Podiatry

## 2019-08-23 ENCOUNTER — Ambulatory Visit (INDEPENDENT_AMBULATORY_CARE_PROVIDER_SITE_OTHER): Payer: 59 | Admitting: Podiatry

## 2019-08-23 DIAGNOSIS — M2021 Hallux rigidus, right foot: Secondary | ICD-10-CM

## 2019-08-23 DIAGNOSIS — Z9889 Other specified postprocedural states: Secondary | ICD-10-CM

## 2019-08-23 MED ORDER — OXYCODONE-ACETAMINOPHEN 5-325 MG PO TABS: 1.0000 | ORAL_TABLET | Freq: Three times a day (TID) | ORAL | 0 refills | Status: DC | PRN

## 2019-08-23 NOTE — Progress Notes (Signed)
Subjective: CYLAR LAPA is a 54 y.o. is seen today in office s/p left foot first MPJ arthrodesis preformed on 08/08/2019.  States he is doing better his pain is controlled.  He states he not been wearing the boot to bed.  He has stepped on a few times but with the boot on.  No falls.  Denies any systemic complaints such as fevers, chills, nausea, vomiting. No calf pain, chest pain, shortness of breath.   Objective: General: No acute distress, AAOx3  DP/PT pulses palpable 2/4, CRT < 3 sec to all digits.  Protective sensation intact. Motor function intact.  RIGHT foot: Incision is well coapted without any evidence of dehiscence with sutures intact. There is no surrounding erythema, ascending cellulitis, fluctuance, crepitus, malodor, drainage/purulence. There is mild edema around the surgical site. There is mild pain along the surgical site.  No other areas of tenderness to bilateral lower extremities.  No other open lesions or pre-ulcerative lesions.  No pain with calf compression, swelling, warmth, erythema.   Assessment and Plan:  Status post right foot first MPJ arthrodesis, doing well with no complications   -Treatment options discussed including all alternatives, risks, and complications -Incision appears to be healing well developed with sutures intact.  Antibiotic ointment and a bandage applied.  Keep the dressing clean, dry, intact -Remain in cam boot nonweightbearing -Ice/elevation -Pain medication as needed. -Monitor for any clinical signs or symptoms of infection and DVT/PE and directed to call the office immediately should any occur or go to the ER. -Follow-up as scheduled for possible suture removal or sooner if any problems arise. In the meantime, encouraged to call the office with any questions, concerns, change in symptoms.   Return in about 2 weeks (around 09/06/2019) for post-op check/x-ray.  Celesta Gentile, DPM

## 2019-08-27 ENCOUNTER — Other Ambulatory Visit: Payer: Self-pay

## 2019-08-28 ENCOUNTER — Encounter: Payer: Self-pay | Admitting: Family Medicine

## 2019-08-28 ENCOUNTER — Ambulatory Visit (INDEPENDENT_AMBULATORY_CARE_PROVIDER_SITE_OTHER): Payer: 59 | Admitting: Family Medicine

## 2019-08-28 VITALS — BP 118/62 | HR 94 | Temp 97.8°F | Ht 69.0 in | Wt 208.8 lb

## 2019-08-28 DIAGNOSIS — Z0000 Encounter for general adult medical examination without abnormal findings: Secondary | ICD-10-CM | POA: Diagnosis not present

## 2019-08-28 LAB — LIPID PANEL
Cholesterol: 131 mg/dL (ref 0–200)
HDL: 43.6 mg/dL (ref >39.00)
LDL Cholesterol: 72 mg/dL (ref 0–99)
NonHDL: 87.3
Total CHOL/HDL Ratio: 3
Triglycerides: 79 mg/dL (ref 0.0–149.0)
VLDL: 15.8 mg/dL (ref 0.0–40.0)

## 2019-08-28 LAB — HEPATIC FUNCTION PANEL
ALT: 28 U/L (ref 0–53)
AST: 17 U/L (ref 0–37)
Albumin: 4.7 g/dL (ref 3.5–5.2)
Alkaline Phosphatase: 84 U/L (ref 39–117)
Bilirubin, Direct: 0.1 mg/dL (ref 0.0–0.3)
Total Bilirubin: 0.4 mg/dL (ref 0.2–1.2)
Total Protein: 7 g/dL (ref 6.0–8.3)

## 2019-08-28 LAB — CBC WITH DIFFERENTIAL/PLATELET
Basophils Absolute: 0 10*3/uL (ref 0.0–0.1)
Basophils Relative: 0.3 % (ref 0.0–3.0)
Eosinophils Absolute: 0.1 10*3/uL (ref 0.0–0.7)
Eosinophils Relative: 1.4 % (ref 0.0–5.0)
HCT: 42.6 % (ref 39.0–52.0)
Hemoglobin: 14.6 g/dL (ref 13.0–17.0)
Lymphocytes Relative: 26.2 % (ref 12.0–46.0)
Lymphs Abs: 2.1 10*3/uL (ref 0.7–4.0)
MCHC: 34.3 g/dL (ref 30.0–36.0)
MCV: 89.5 fl (ref 78.0–100.0)
Monocytes Absolute: 0.5 10*3/uL (ref 0.1–1.0)
Monocytes Relative: 6 % (ref 3.0–12.0)
Neutro Abs: 5.2 10*3/uL (ref 1.4–7.7)
Neutrophils Relative %: 66.1 % (ref 43.0–77.0)
Platelets: 311 10*3/uL (ref 150.0–400.0)
RBC: 4.76 Mil/uL (ref 4.22–5.81)
RDW: 12.3 % (ref 11.5–15.5)
WBC: 7.9 10*3/uL (ref 4.0–10.5)

## 2019-08-28 LAB — BASIC METABOLIC PANEL
BUN: 15 mg/dL (ref 6–23)
CO2: 30 mEq/L (ref 19–32)
Calcium: 9.6 mg/dL (ref 8.4–10.5)
Chloride: 99 mEq/L (ref 96–112)
Creatinine, Ser: 0.92 mg/dL (ref 0.40–1.50)
GFR: 86.14 mL/min (ref >60.00)
Glucose, Bld: 147 mg/dL — ABNORMAL HIGH (ref 70–99)
Potassium: 4.4 mEq/L (ref 3.5–5.1)
Sodium: 137 mEq/L (ref 135–145)

## 2019-08-28 LAB — HEMOGLOBIN A1C: Hgb A1c MFr Bld: 7.1 % — ABNORMAL HIGH (ref 4.6–6.5)

## 2019-08-28 LAB — PSA: PSA: 0.36 ng/mL (ref 0.10–4.00)

## 2019-08-28 LAB — TSH: TSH: 1.79 u[IU]/mL (ref 0.35–4.50)

## 2019-08-28 NOTE — Patient Instructions (Signed)
Preventive Care 41-53 Years Old, Male Preventive care refers to lifestyle choices and visits with your health care provider that can promote health and wellness. This includes:  A yearly physical exam. This is also called an annual well check.  Regular dental and eye exams.  Immunizations.  Screening for certain conditions.  Healthy lifestyle choices, such as eating a healthy diet, getting regular exercise, not using drugs or products that contain nicotine and tobacco, and limiting alcohol use. What can I expect for my preventive care visit? Physical exam Your health care provider will check:  Height and weight. These may be used to calculate body mass index (BMI), which is a measurement that tells if you are at a healthy weight.  Heart rate and blood pressure.  Your skin for abnormal spots. Counseling Your health care provider may ask you questions about:  Alcohol, tobacco, and drug use.  Emotional well-being.  Home and relationship well-being.  Sexual activity.  Eating habits.  Work and work Statistician. What immunizations do I need?  Influenza (flu) vaccine  This is recommended every year. Tetanus, diphtheria, and pertussis (Tdap) vaccine  You may need a Td booster every 10 years. Varicella (chickenpox) vaccine  You may need this vaccine if you have not already been vaccinated. Zoster (shingles) vaccine  You may need this after age 64. Measles, mumps, and rubella (MMR) vaccine  You may need at least one dose of MMR if you were born in 1957 or later. You may also need a second dose. Pneumococcal conjugate (PCV13) vaccine  You may need this if you have certain conditions and were not previously vaccinated. Pneumococcal polysaccharide (PPSV23) vaccine  You may need one or two doses if you smoke cigarettes or if you have certain conditions. Meningococcal conjugate (MenACWY) vaccine  You may need this if you have certain conditions. Hepatitis A  vaccine  You may need this if you have certain conditions or if you travel or work in places where you may be exposed to hepatitis A. Hepatitis B vaccine  You may need this if you have certain conditions or if you travel or work in places where you may be exposed to hepatitis B. Haemophilus influenzae type b (Hib) vaccine  You may need this if you have certain risk factors. Human papillomavirus (HPV) vaccine  If recommended by your health care provider, you may need three doses over 6 months. You may receive vaccines as individual doses or as more than one vaccine together in one shot (combination vaccines). Talk with your health care provider about the risks and benefits of combination vaccines. What tests do I need? Blood tests  Lipid and cholesterol levels. These may be checked every 5 years, or more frequently if you are over 60 years old.  Hepatitis C test.  Hepatitis B test. Screening  Lung cancer screening. You may have this screening every year starting at age 43 if you have a 30-pack-year history of smoking and currently smoke or have quit within the past 15 years.  Prostate cancer screening. Recommendations will vary depending on your family history and other risks.  Colorectal cancer screening. All adults should have this screening starting at age 72 and continuing until age 2. Your health care provider may recommend screening at age 14 if you are at increased risk. You will have tests every 1-10 years, depending on your results and the type of screening test.  Diabetes screening. This is done by checking your blood sugar (glucose) after you have not eaten  for a while (fasting). You may have this done every 1-3 years.  Sexually transmitted disease (STD) testing. Follow these instructions at home: Eating and drinking  Eat a diet that includes fresh fruits and vegetables, whole grains, lean protein, and low-fat dairy products.  Take vitamin and mineral supplements as  recommended by your health care provider.  Do not drink alcohol if your health care provider tells you not to drink.  If you drink alcohol: ? Limit how much you have to 0-2 drinks a day. ? Be aware of how much alcohol is in your drink. In the [place] one drink equals one 12 oz bottle of beer (355 mL), one 5 oz glass of wine (148 mL), or one 1 oz glass of hard liquor (44 mL). Lifestyle  Take daily care of your teeth and gums.  Stay active. Exercise for at least 30 minutes on 5 or more days each week.  Do not use any products that contain nicotine or tobacco, such as cigarettes, e-cigarettes, and chewing tobacco. If you need help quitting, ask your health care provider.  If you are sexually active, practice safe sex. Use a condom or other form of protection to prevent STIs (sexually transmitted infections).  Talk with your health care provider about taking a low-dose aspirin every day starting at age 53. What's next?  Go to your health care provider once a year for a well check visit.  Ask your health care provider how often you should have your eyes and teeth checked.  Stay up to date on all vaccines. This information is not intended to replace advice given to you by your health care provider. Make sure you discuss any questions you have with your health care provider. Document Revised: 03/30/2018 Document Reviewed: 03/30/2018 Elsevier Patient Education  2020 [employer].

## 2019-08-28 NOTE — Progress Notes (Signed)
Subjective:     Patient ID: Spencer Duffy, male   DOB: 03/30/1966, 54 y.o.   MRN: [id number]  HPI Skiler is here for physical exam.  He had recent right foot surgery for severe osteoarthritis of the metatarsophalangeal joint.  He is recovering uneventfully.  He has missed not been able to exercise.  Hopes to start bearing weight more soon.  We discussed referral for sleep apnea rule out back in February.  Referral was made but he was never contacted.  We have started statin recently with Crestor and he is tolerating well with no side effects.  He needs follow-up labs.  Health maintenance reviewed  -Covid vaccine already given -Previous Pneumovax given -Colonoscopy due 2028 -Tetanus due 2023 -Needs follow-up A1c today -Last eye exam January 22, 2019 -Shingrix vaccine was given last year  Past Medical History:  Diagnosis Date  . Arthritis   . Diabetes mellitus    TYPE II  . GERD (gastroesophageal reflux disease)   . Hay fever    ALLERGIES  . Heart murmur    Past Surgical History:  Procedure Laterality Date  . BILATERAL KNEE ARTHROSCOPY     X2  . ELBOW ARTHROSCOPY WITH TENDON RECONSTRUCTION Right 12/22/2016  . Neuroplasty Digital Nerve Right 06/21/2013   @ [place]  . ROTATOR CUFF REPAIR Bilateral   . SKIN CANCER EXCISION     ON CHEST  . Tarsal Exostectomy Right 06/21/2013   @ [place]    reports that he has never smoked. He has quit using smokeless tobacco. He reports current alcohol use. He reports that he does not use drugs. family history includes Arthritis in an other family member; Colon cancer in his paternal grandmother; Diabetes in his brother, maternal grandmother, paternal grandmother, sister, and another family member; Head & neck cancer in his brother; Heart attack in his father; Hypertension in an other family member; Lung cancer in his father; Stroke in his paternal grandfather. No Known Allergies  - Wt Readings from Last 3 Encounters:  08/28/19 208 lb 12.8 oz (94.7 kg)   07/09/19 208 lb 3.2 oz (94.4 kg)  06/15/19 206 lb 12.8 oz (93.8 kg)     Review of Systems  Constitutional: Negative for activity change, appetite change, fatigue and fever.  HENT: Negative for congestion, ear pain and trouble swallowing.   Eyes: Negative for pain and visual disturbance.  Respiratory: Negative for cough, shortness of breath and wheezing.   Cardiovascular: Negative for chest pain and palpitations.  Gastrointestinal: Negative for abdominal distention, abdominal pain, blood in stool, constipation, diarrhea, nausea, rectal pain and vomiting.  Endocrine: Negative for polydipsia and polyuria.  Genitourinary: Negative for dysuria, hematuria and testicular pain.  Musculoskeletal: Negative for joint swelling.  Skin: Negative for rash.  Neurological: Negative for dizziness, syncope and headaches.  Hematological: Negative for adenopathy.  Psychiatric/Behavioral: Negative for confusion and dysphoric mood.       Objective:   Physical Exam Constitutional:      General: He is not in acute distress.    Appearance: He is well-developed.  HENT:     Head: Normocephalic and atraumatic.     Right Ear: External ear normal.     Left Ear: External ear normal.  Eyes:     Conjunctiva/sclera: Conjunctivae normal.     Pupils: Pupils are equal, round, and reactive to light.  Neck:     Thyroid: No thyromegaly.  Cardiovascular:     Rate and Rhythm: Normal rate and regular rhythm.     Heart  sounds: Normal heart sounds. No murmur.  Pulmonary:     Effort: No respiratory distress.     Breath sounds: No wheezing or rales.  Abdominal:     General: Bowel sounds are normal. There is no distension.     Palpations: Abdomen is soft. There is no mass.     Tenderness: There is no abdominal tenderness. There is no guarding or rebound.  Musculoskeletal:     Cervical back: Normal range of motion and neck supple.     Comments: Right foot ankle and leg in a Cam walker  Lymphadenopathy:      Cervical: No cervical adenopathy.  Skin:    Findings: No rash.  Neurological:     Mental Status: He is alert and oriented to person, place, and time.     Cranial Nerves: No cranial nerve deficit.     Deep Tendon Reflexes: Reflexes normal.        Assessment:     Physical exam.  He has type 2 diabetes which is well controlled.  Recent initiation of statin with Crestor given his diabetes status.  We recommend the following    Plan:     -Obtain labs including basic metabolic panel, hepatic panel, TSH, CBC, lipid panel, A1c, PSA -Continue with annual eye exam -We will check to see why pulmonary referral did not go through.  We had referred for rule out sleep apnea -Continue with annual flu vaccine  Eulas Post MD [facility] '

## 2019-08-30 ENCOUNTER — Ambulatory Visit (INDEPENDENT_AMBULATORY_CARE_PROVIDER_SITE_OTHER): Payer: 59 | Admitting: Internal Medicine

## 2019-08-30 ENCOUNTER — Encounter: Payer: Self-pay | Admitting: Internal Medicine

## 2019-08-30 ENCOUNTER — Other Ambulatory Visit: Payer: Self-pay

## 2019-08-30 VITALS — BP 118/70 | HR 71 | Temp 98.1°F | Ht 69.0 in | Wt 208.0 lb

## 2019-08-30 DIAGNOSIS — G4733 Obstructive sleep apnea (adult) (pediatric): Secondary | ICD-10-CM | POA: Diagnosis not present

## 2019-08-30 DIAGNOSIS — Z9889 Other specified postprocedural states: Secondary | ICD-10-CM | POA: Diagnosis not present

## 2019-08-30 DIAGNOSIS — R0683 Snoring: Secondary | ICD-10-CM

## 2019-08-30 NOTE — Progress Notes (Signed)
08/30/19- 3 yoM for sleep evaluation courtesy of Dr.Burchette. Medical problem list includes GERD, DM2, Meds include Eszopiclone 3 mg Body weight today- 208 lbs Epworth score- 15 He describes difficulty falling asleep and restless, waking several times/ night. This has been over past year. Daytime tiredness. Lunesta helps, used only every few weeks to avoid dependence. No nocturia. Sleep number bed.Does snore. Travels internationally "75% of the time". Lunesta helps sleep on plane. Brother has CPAP. No ENT surgery, heart or lung disease. Drinks coffee only before 10AM.   Prior to Admission medications   Medication Sig Start Date End Date Taking? Authorizing Provider  diclofenac Sodium (VOLTAREN) 1 % GEL Apply 2 g topically 4 (four) times daily. Rub into affected area of foot 2 to 4 times daily 06/29/19  Yes Trula Slade, DPM  Eszopiclone 3 MG TABS TAKE 1 TABLET BY MOUTH IMMEDIATELY BEFORE BEDTIME AS NEEDED 08/03/19  Yes Burchette, Alinda Sierras, MD  metFORMIN (GLUCOPHAGE) 500 MG tablet TAKE 1 TABLET TWICE A DAY  WITH MEALS 06/15/19  Yes Burchette, Alinda Sierras, MD  Multiple Vitamin (MULTIVITAMIN) tablet Take 1 tablet by mouth daily.   Yes [provider]  ondansetron (ZOFRAN ODT) 8 MG disintegrating tablet Take 1 tablet (8 mg total) by mouth every 8 (eight) hours as needed for nausea or vomiting. 05/19/18  Yes Burchette, Alinda Sierras, MD  oxyCODONE-acetaminophen (PERCOCET/ROXICET) 5-325 MG tablet Take 1-2 tablets by mouth every 8 (eight) hours as needed for severe pain. 08/23/19  Yes Trula Slade, DPM  promethazine (PHENERGAN) 25 MG tablet Take 1 tablet (25 mg total) by mouth every 8 (eight) hours as needed for nausea or vomiting. 08/08/19  Yes Trula Slade, DPM  ramipril (ALTACE) 5 MG capsule Take 1 capsule (5 mg total) by mouth daily. 06/15/19  Yes Burchette, Alinda Sierras, MD  rosuvastatin (CRESTOR) 10 MG tablet Take 1 tablet (10 mg total) by mouth daily. 06/15/19  Yes Burchette, Alinda Sierras, MD   tapentadol (NUCYNTA) 50 MG tablet Take 1 tablet (50 mg total) by mouth every 6 (six) hours as needed. 08/14/19  Yes Hyatt, Max T, DPM   Past Medical History:  Diagnosis Date  . Arthritis   . Diabetes mellitus    TYPE II  . GERD (gastroesophageal reflux disease)   . Hay fever    ALLERGIES  . Heart murmur    Past Surgical History:  Procedure Laterality Date  . BILATERAL KNEE ARTHROSCOPY     X2  . ELBOW ARTHROSCOPY WITH TENDON RECONSTRUCTION Right 12/22/2016  . Neuroplasty Digital Nerve Right 06/21/2013   @ [place]  . ROTATOR CUFF REPAIR Bilateral   . SKIN CANCER EXCISION     ON CHEST  . Tarsal Exostectomy Right 06/21/2013   @ [place]   Family History  Problem Relation Age of Onset  . Arthritis Other   . Hypertension Other   . Diabetes Other        PARENT, GRANDPARENT  . Lung cancer Father   . Heart attack Father   . Colon cancer Paternal Grandmother        58's  . Diabetes Paternal Grandmother   . Diabetes Sister   . Head & neck cancer Brother   . Diabetes Brother   . Diabetes Maternal Grandmother   . Stroke Paternal Grandfather    Social History   Socioeconomic History  . Marital status: Married    Spouse name: Not on file  . Number of children: Not on file  . Years of education:  Not on file  . Highest education level: Not on file  Occupational History    Employer: [employer]  Tobacco Use  . Smoking status: Never Smoker  . Smokeless tobacco: Former Network engineer and Sexual Activity  . Alcohol use: Yes    Comment: wine occ   . Drug use: No  . Sexual activity: Not on file  Other Topics Concern  . Not on file  Social History Narrative  . Not on file   Social Determinants of Health   Financial Resource Strain:   . Difficulty of Paying Living Expenses:   Food Insecurity:   . Worried About Charity fundraiser in the Last Year:   . Arboriculturist in the Last Year:   Transportation Needs:   . Film/video editor (Medical):   Marland Kitchen Lack of Transportation  (Non-Medical):   Physical Activity:   . Days of Exercise per Week:   . Minutes of Exercise per Session:   Stress:   . Feeling of Stress :   Social Connections:   . Frequency of Communication with Friends and Family:   . Frequency of Social Gatherings with Friends and Family:   . Attends Religious Services:   . Active Member of Clubs or Organizations:   . Attends Archivist Meetings:   Marland Kitchen Marital Status:   Intimate Partner Violence:   . Fear of Current or Ex-Partner:   . Emotionally Abused:   Marland Kitchen Physically Abused:   . Sexually Abused:    ROS-see HPI   + = positive Constitutional:    weight loss, night sweats, fevers, chills, fatigue, lassitude. HEENT:    headaches, difficulty swallowing, tooth/dental problems, sore throat,       sneezing, itching, ear ache, nasal congestion, post nasal drip, snoring CV:    chest pain, orthopnea, PND, swelling in lower extremities, anasarca,                                  dizziness, palpitations Resp:   shortness of breath with exertion or at rest.                productive cough,   non-productive cough, coughing up of blood.              change in color of mucus.  wheezing.   Skin:    rash or lesions. GI:  No-   heartburn, indigestion, abdominal pain, nausea, vomiting, diarrhea,                 change in bowel habits, loss of appetite GU: dysuria, change in color of urine, no urgency or frequency.   flank pain. MS:  + joint pain, stiffness, decreased range of motion, back pain. Neuro-     nothing unusual Psych:  change in mood or affect.  depression or anxiety.   memory loss.  OBJ- Physical Exam General- Alert, Oriented, Affect-appropriate, Distress- none acute, + appears fit Skin- rash-none, lesions- none, excoriation- none Lymphadenopathy- none Head- atraumatic            Eyes- Gross vision intact, PERRLA, conjunctivae and secretions clear            Ears- Hearing, canals-normal            Nose- Clear, no-Septal dev, mucus,  polyps, erosion, perforation             Throat- Mallampati II-III , mucosa  clear , drainage- none, tonsils- atrophic Neck- flexible , trachea midline, no stridor , thyroid nl, carotid no bruit Chest - symmetrical excursion , unlabored           Heart/CV- RRR , no murmur , no gallop  , no rub, nl s1 s2                           - JVD- none , edema- none, stasis changes- none, varices- none           Lung- clear to P&A, wheeze- none, cough- none , dullness-none, rub- none           Chest wall-  Abd-  Br/ Gen/ Rectal- Not done, not indicated Extrem-+ foot in boot after toe surgery Neuro- grossly intact to observation

## 2019-08-30 NOTE — Patient Instructions (Signed)
Order- schedule sleep study    Dx OSA  Please call us about 2 weeks after your sleep test for results and recommendations. If appropriate, we may be able to start treatment before we see you next.

## 2019-08-31 DIAGNOSIS — G4733 Obstructive sleep apnea (adult) (pediatric): Secondary | ICD-10-CM | POA: Insufficient documentation

## 2019-08-31 NOTE — Assessment & Plan Note (Signed)
He says foot pain is not significantly impacting sleep.

## 2019-08-31 NOTE — Assessment & Plan Note (Signed)
Suspect OSA with Insomnia. Frequent travel may contribute.We will rule-out sleep disordered breathing before addressing insomnia. Sleep hygiene, safe driving, testing, treatment discussed. Plan- sleep study

## 2019-09-06 ENCOUNTER — Encounter: Payer: Self-pay | Admitting: Podiatry

## 2019-09-06 ENCOUNTER — Ambulatory Visit (INDEPENDENT_AMBULATORY_CARE_PROVIDER_SITE_OTHER): Payer: 59 | Admitting: Podiatry

## 2019-09-06 ENCOUNTER — Ambulatory Visit (INDEPENDENT_AMBULATORY_CARE_PROVIDER_SITE_OTHER): Payer: 59

## 2019-09-06 ENCOUNTER — Other Ambulatory Visit: Payer: Self-pay

## 2019-09-06 VITALS — Temp 97.9°F

## 2019-09-06 DIAGNOSIS — M2021 Hallux rigidus, right foot: Secondary | ICD-10-CM

## 2019-09-06 DIAGNOSIS — M2062 Acquired deformities of toe(s), unspecified, left foot: Secondary | ICD-10-CM

## 2019-09-06 DIAGNOSIS — Z9889 Other specified postprocedural states: Secondary | ICD-10-CM

## 2019-09-08 NOTE — Progress Notes (Signed)
Subjective: Spencer Duffy is a 54 y.o. is seen today in office s/p left foot first MPJ arthrodesis preformed on 08/08/2019.  States he is doing well.  He was going up the steps without his boot he.  Fall putting weight on his foot.  He did notice some swelling afterwards as well as discomfort.  That did resolve.  He has no other concerns. Denies any systemic complaints such as fevers, chills, nausea, vomiting. No calf pain, chest pain, shortness of breath.   Objective: General: No acute distress, AAOx3  DP/PT pulses palpable 2/4, CRT < 3 sec to all digits.  Protective sensation intact. Motor function intact.  RIGHT foot: Incision is well coapted without any evidence of dehiscence. There is no surrounding erythema, ascending cellulitis, fluctuance, crepitus, malodor, drainage/purulence. There is mild but improved edema around the surgical site. There is no significant pain along the surgical site.  No other areas of tenderness to bilateral lower extremities.  No other open lesions or pre-ulcerative lesions.  No pain with calf compression, swelling, warmth, erythema.   Assessment and Plan:  Status post right foot first MPJ arthrodesis, doing well with no complications   -Treatment options discussed including all alternatives, risks, and complications -X-rays obtained reviewed.  Status post first MPJ arthrodesis with hardware intact.  No evidence of acute fracture. -I would continue antibiotic ointment dressing changes to the incision for nail and also scar formed completely then he can switch to put the latter providing any oil on the incision. -Discussed treatment transition to partial weightbearing in the cam boot and if he is feeling better he can weight-bear in the cam boot.  Continue ice elevate.  Return in about 3 weeks (around 09/27/2019). X-ray next appointment   Celesta Gentile, DPM

## 2019-09-12 ENCOUNTER — Other Ambulatory Visit: Payer: Self-pay | Admitting: Podiatry

## 2019-09-12 DIAGNOSIS — M2061 Acquired deformities of toe(s), unspecified, right foot: Secondary | ICD-10-CM

## 2019-09-20 ENCOUNTER — Other Ambulatory Visit: Payer: Self-pay | Admitting: Podiatry

## 2019-09-20 DIAGNOSIS — M2061 Acquired deformities of toe(s), unspecified, right foot: Secondary | ICD-10-CM

## 2019-09-27 ENCOUNTER — Ambulatory Visit: Payer: 59

## 2019-09-27 ENCOUNTER — Other Ambulatory Visit: Payer: Self-pay

## 2019-09-27 DIAGNOSIS — G4733 Obstructive sleep apnea (adult) (pediatric): Secondary | ICD-10-CM

## 2019-10-01 ENCOUNTER — Encounter: Payer: Self-pay | Admitting: Podiatry

## 2019-10-01 ENCOUNTER — Other Ambulatory Visit: Payer: Self-pay

## 2019-10-01 ENCOUNTER — Ambulatory Visit (INDEPENDENT_AMBULATORY_CARE_PROVIDER_SITE_OTHER): Payer: 59

## 2019-10-01 ENCOUNTER — Ambulatory Visit (INDEPENDENT_AMBULATORY_CARE_PROVIDER_SITE_OTHER): Payer: 59 | Admitting: Podiatry

## 2019-10-01 DIAGNOSIS — M2021 Hallux rigidus, right foot: Secondary | ICD-10-CM | POA: Diagnosis not present

## 2019-10-01 DIAGNOSIS — M2062 Acquired deformities of toe(s), unspecified, left foot: Secondary | ICD-10-CM

## 2019-10-09 DIAGNOSIS — M2021 Hallux rigidus, right foot: Secondary | ICD-10-CM | POA: Insufficient documentation

## 2019-10-09 NOTE — Progress Notes (Signed)
Subjective: Spencer Duffy is a 54 y.o. is seen today in office s/p left foot first MPJ arthrodesis preformed on 08/08/2019.  States that he is doing well he is wearing the cam boot.  He is having no significant pain.  Some mild swelling intermittently. Denies any systemic complaints such as fevers, chills, nausea, vomiting. No calf pain, chest pain, shortness of breath.   Objective: General: No acute distress, AAOx3  DP/PT pulses palpable 2/4, CRT < 3 sec to all digits.  Protective sensation intact. Motor function intact.  RIGHT foot: Incision is well coapted without any evidence of dehiscence and the scar is well formed.  There is mild swelling but there is no erythema or warmth.  There is no tenderness palpation surgical site.  No other areas of tenderness identified today.  No other areas of tenderness to bilateral lower extremities.  No other open lesions or pre-ulcerative lesions.  No pain with calf compression, swelling, warmth, erythema.   Assessment and Plan:  Status post right foot first MPJ arthrodesis, doing well with no complications   -Treatment options discussed including all alternatives, risks, and complications -X-rays obtained reviewed.  Status post first MPJ arthrodesis with hardware intact.  No evidence of acute fracture.  Increased consolidation across the arthrodesis site. -At this time I discussed with him slowly transition out of the cam boot into regular shoe but will do this gradually each day.  Only continue ice elevate as well as wear compression anklet. -Possible measurement for orthotics next appointment  Trula Slade DPM

## 2019-10-10 DIAGNOSIS — G4733 Obstructive sleep apnea (adult) (pediatric): Secondary | ICD-10-CM | POA: Diagnosis not present

## 2019-11-06 ENCOUNTER — Other Ambulatory Visit: Payer: Self-pay

## 2019-11-06 ENCOUNTER — Ambulatory Visit (INDEPENDENT_AMBULATORY_CARE_PROVIDER_SITE_OTHER): Payer: 59 | Admitting: Podiatry

## 2019-11-06 DIAGNOSIS — M2021 Hallux rigidus, right foot: Secondary | ICD-10-CM

## 2019-11-06 DIAGNOSIS — M2062 Acquired deformities of toe(s), unspecified, left foot: Secondary | ICD-10-CM

## 2019-11-07 NOTE — Progress Notes (Signed)
Subjective: Spencer Duffy is a 54 y.o. is seen today in office s/p left foot first MPJ arthrodesis preformed on 08/08/2019.  States that he is doing well.  He was in [place] on a mission trip and had some swelling after being on his feet all day but otherwise been doing well.  He is wearing regular shoes. Denies any systemic complaints such as fevers, chills, nausea, vomiting. No calf pain, chest pain, shortness of breath.   Objective: General: No acute distress, AAOx3  DP/PT pulses palpable 2/4, CRT < 3 sec to all digits.  Protective sensation intact. Motor function intact.  RIGHT foot: Incision is well coapted without any evidence of dehiscence and the scar is well formed.  No pain at surgical site and the arthrodesis site is stable.  Minimal edema there is no erythema or warmth. Decreased ROM of the left first MPJ. No other areas of tenderness to bilateral lower extremities.  No other open lesions or pre-ulcerative lesions.  No pain with calf compression, swelling, warmth, erythema.   Assessment and Plan:  Status post right foot first MPJ arthrodesis, doing well with no complications   -Treatment options discussed including all alternatives, risks, and complications -He is doing on the right side currently no pain.  He had some increase in swelling after being on his feet but that resolved.  At this point discussed with him gradually returning back to normal activity, exercise.  I am not on-call for orthotics to help with the left first MPJ hallux limitus as well.  No follow-ups on file.  Trula Slade DPM

## 2019-11-15 ENCOUNTER — Other Ambulatory Visit: Payer: Self-pay

## 2019-11-15 ENCOUNTER — Ambulatory Visit (INDEPENDENT_AMBULATORY_CARE_PROVIDER_SITE_OTHER): Payer: 59 | Admitting: Orthotics

## 2019-11-15 DIAGNOSIS — M2062 Acquired deformities of toe(s), unspecified, left foot: Secondary | ICD-10-CM

## 2019-11-15 DIAGNOSIS — Z9889 Other specified postprocedural states: Secondary | ICD-10-CM

## 2019-11-15 DIAGNOSIS — M2021 Hallux rigidus, right foot: Secondary | ICD-10-CM | POA: Diagnosis not present

## 2019-11-15 NOTE — Progress Notes (Signed)
Cast for f/o to addres HR of right foot; plan on rigid mortons extension and hug arch

## 2019-11-30 ENCOUNTER — Other Ambulatory Visit: Payer: Self-pay

## 2019-11-30 ENCOUNTER — Ambulatory Visit (INDEPENDENT_AMBULATORY_CARE_PROVIDER_SITE_OTHER): Payer: 59 | Admitting: Internal Medicine

## 2019-11-30 ENCOUNTER — Encounter: Payer: Self-pay | Admitting: Internal Medicine

## 2019-11-30 VITALS — BP 106/70 | HR 71 | Temp 97.5°F | Ht 72.0 in | Wt 209.8 lb

## 2019-11-30 DIAGNOSIS — G4733 Obstructive sleep apnea (adult) (pediatric): Secondary | ICD-10-CM | POA: Diagnosis not present

## 2019-11-30 DIAGNOSIS — E119 Type 2 diabetes mellitus without complications: Secondary | ICD-10-CM

## 2019-11-30 NOTE — Patient Instructions (Signed)
Order- new DME, new CPAP auto 5-20, mask of choice,  humidifier, supplies, AirView/ card  Please call as needed 

## 2019-11-30 NOTE — Progress Notes (Signed)
HPI M never smoker followed for OSA, Insomnia,  complicated by GERD, DM2, HST 09/27/19- AHI 17.2/ hr, desaturation to 82%, body weight 208 lbs  --------------------------------------------------------------------------------  08/30/19- 51 yoM for sleep evaluation courtesy of Dr.Burchette. Medical problem list includes GERD, DM2, Meds include Eszopiclone 3 mg Body weight today- 208 lbs Epworth score- 15 He describes difficulty falling asleep and restless, waking several times/ night. This has been over past year. Daytime tiredness. Lunesta helps, used only every few weeks to avoid dependence. No nocturia. Sleep number bed.Does snore. Travels internationally "75% of the time". Lunesta helps sleep on plane. Brother has CPAP. No ENT surgery, heart or lung disease. Drinks coffee only before 10AM.  11/30/19- 68 yoM followed for OSA, Insomnia,  complicated by GERD, DM2, HST 09/27/19- AHI 17.2/ hr, desaturation to 82%, body weight 208 lbs -----no change, never got sleep test results Body weight today 209 lbs Had 2 Phizer Cova We reviewed sleep study and options, driving safety. Lunesta occ,  ROS-see HPI   + = positive Constitutional:    weight loss, night sweats, fevers, chills, fatigue, lassitude. HEENT:    headaches, difficulty swallowing, tooth/dental problems, sore throat,       sneezing, itching, ear ache, nasal congestion, post nasal drip, snoring CV:    chest pain, orthopnea, PND, swelling in lower extremities, anasarca,                                  dizziness, palpitations Resp:   shortness of breath with exertion or at rest.                productive cough,   non-productive cough, coughing up of blood.              change in color of mucus.  wheezing.   Skin:    rash or lesions. GI:  No-   heartburn, indigestion, abdominal pain, nausea, vomiting, diarrhea,                 change in bowel habits, loss of appetite GU: dysuria, change in color of urine, no urgency or frequency.    flank pain. MS:  + joint pain, stiffness, decreased range of motion, back pain. Neuro-     nothing unusual Psych:  change in mood or affect.  depression or anxiety.   memory loss.  OBJ- Physical Exam General- Alert, Oriented, Affect-appropriate, Distress- none acute, + appears fit Skin- rash-none, lesions- none, excoriation- none Lymphadenopathy- none Head- atraumatic            Eyes- Gross vision intact, PERRLA, conjunctivae and secretions clear            Ears- Hearing, canals-normal            Nose- Clear, no-Septal dev, mucus, polyps, erosion, perforation             Throat- Mallampati II-III , mucosa clear , drainage- none, tonsils- atrophic Neck- flexible , trachea midline, no stridor , thyroid nl, carotid no bruit Chest - symmetrical excursion , unlabored           Heart/CV- RRR , no murmur , no gallop  , no rub, nl s1 s2                           - JVD- none , edema- none, stasis changes- none, varices- none  Lung- clear to P&A, wheeze- none, cough- none , dullness-none, rub- none           Chest wall-  Abd-  Br/ Gen/ Rectal- Not done, not indicated Extrem-+ foot in boot after toe surgery Neuro- grossly intact to observation

## 2019-12-06 ENCOUNTER — Other Ambulatory Visit: Payer: Self-pay

## 2019-12-06 ENCOUNTER — Other Ambulatory Visit: Payer: 59 | Admitting: Orthotics

## 2019-12-11 NOTE — Assessment & Plan Note (Signed)
Appropriate review of medical issues, driving safety, treatment options Plan- start CPAP auto 5-20

## 2019-12-11 NOTE — Assessment & Plan Note (Signed)
Recognized association of OSA with more difficulty controlling blood sugar

## 2020-01-02 ENCOUNTER — Other Ambulatory Visit: Payer: Self-pay | Admitting: Family Medicine

## 2020-01-07 ENCOUNTER — Ambulatory Visit: Payer: 59 | Admitting: Podiatry

## 2020-01-22 ENCOUNTER — Other Ambulatory Visit: Payer: Self-pay

## 2020-01-22 ENCOUNTER — Ambulatory Visit (INDEPENDENT_AMBULATORY_CARE_PROVIDER_SITE_OTHER): Payer: 59

## 2020-01-22 ENCOUNTER — Ambulatory Visit (INDEPENDENT_AMBULATORY_CARE_PROVIDER_SITE_OTHER): Payer: 59 | Admitting: Podiatry

## 2020-01-22 DIAGNOSIS — S92314K Nondisplaced fracture of first metatarsal bone, right foot, subsequent encounter for fracture with nonunion: Secondary | ICD-10-CM | POA: Diagnosis not present

## 2020-01-22 DIAGNOSIS — M2021 Hallux rigidus, right foot: Secondary | ICD-10-CM

## 2020-01-22 DIAGNOSIS — M79671 Pain in right foot: Secondary | ICD-10-CM | POA: Diagnosis not present

## 2020-01-23 LAB — HM DIABETES EYE EXAM

## 2020-01-23 NOTE — Progress Notes (Signed)
Subjective: Spencer Duffy is a 54 y.o. is seen today in office s/p left foot first MPJ arthrodesis preformed on 08/08/2019.  Overall has been doing well.  He does state that if he does not wear the orthotic he will get swelling discomfort to the foot.  He has not returned to running has been doing normal activities.  Has not returned to running because of the weather not because of his foot he reports.  He states that he wears a boot or a dress shoe he will get increased discomfort to the foot mostly on the surgical site. Denies any systemic complaints such as fevers, chills, nausea, vomiting. No calf pain, chest pain, shortness of breath.   Objective: General: No acute distress, AAOx3  DP/PT pulses palpable 2/4, CRT < 3 sec to all digits.  Protective sensation intact. Motor function intact.  RIGHT foot: Incision is well coapted without any evidence of dehiscence and the scar is well formed.  There is no pain at surgical site there is minimal edema.  Arthrodesis site appears to be stable.  There is no erythema or warmth.  No signs of infection. No other areas of tenderness to bilateral lower extremities.  No other open lesions or pre-ulcerative lesions.  No pain with calf compression, swelling, warmth, erythema.   Assessment and Plan:  Status post right foot first MPJ arthrodesis, concern for nonunion  -Treatment options discussed including all alternatives, risks, and complications -X-rays obtained and reviewed.  Radiolucency is noted on the arthrodesis site consistent with a nonunion at this point.  Fracture Is approximately 3 mm.  Due to the x-ray findings as well as his continued discomfort I would order a bone stimulator.  Continue with stiffer soled shoe.  I dispensed a graphite insert to wear tighter shoes or boots. -Exogen bone stimulator ordered  Trula Slade DPM

## 2020-01-25 DIAGNOSIS — G4733 Obstructive sleep apnea (adult) (pediatric): Secondary | ICD-10-CM

## 2020-01-25 NOTE — Telephone Encounter (Signed)
CY please advise on patient email:   Dr. Annamaria Boots, You mentioned a place that specialized in CPAP mask fittings with more options available than currently with [facility]. I have tried 4 masks so far and am still struggling. I would like to visit the other place you mentioned if possible.

## 2020-01-25 NOTE — Telephone Encounter (Signed)
Please refer to sleep center for mask fitting

## 2020-01-25 NOTE — Addendum Note (Signed)
Addended by: Len Blalock on: 01/25/2020 05:04 PM   Modules accepted: Orders

## 2020-02-11 ENCOUNTER — Other Ambulatory Visit (HOSPITAL_BASED_OUTPATIENT_CLINIC_OR_DEPARTMENT_OTHER): Payer: 59 | Admitting: Internal Medicine

## 2020-02-20 ENCOUNTER — Other Ambulatory Visit: Payer: Self-pay

## 2020-02-20 ENCOUNTER — Ambulatory Visit (HOSPITAL_BASED_OUTPATIENT_CLINIC_OR_DEPARTMENT_OTHER): Payer: 59 | Admitting: Internal Medicine

## 2020-03-03 ENCOUNTER — Ambulatory Visit: Payer: 59 | Admitting: Internal Medicine

## 2020-03-04 ENCOUNTER — Ambulatory Visit: Payer: 59 | Admitting: Podiatry

## 2020-03-10 ENCOUNTER — Ambulatory Visit (INDEPENDENT_AMBULATORY_CARE_PROVIDER_SITE_OTHER): Payer: 59

## 2020-03-10 ENCOUNTER — Other Ambulatory Visit: Payer: Self-pay

## 2020-03-10 ENCOUNTER — Ambulatory Visit (INDEPENDENT_AMBULATORY_CARE_PROVIDER_SITE_OTHER): Payer: 59 | Admitting: Podiatry

## 2020-03-10 DIAGNOSIS — M2021 Hallux rigidus, right foot: Secondary | ICD-10-CM | POA: Diagnosis not present

## 2020-03-17 NOTE — Progress Notes (Signed)
Subjective: Spencer Duffy is a 54 y.o. is seen today in office s/p left foot first MPJ arthrodesis preformed on 08/08/2019.  He was unable to get the procedure due to insurance issues.  He has been wearing regular shoe been doing normal activities without any significant discomfort he feels he has made improvements with the last saw him.  Is occasional discomfort but not on a regular basis.  No other concerns today. Denies any systemic complaints such as fevers, chills, nausea, vomiting. No calf pain, chest pain, shortness of breath.   Objective: General: No acute distress, AAOx3  DP/PT pulses palpable 2/4, CRT < 3 sec to all digits.  Protective sensation intact. Motor function intact.  RIGHT foot: Incision is well coapted without any evidence of dehiscence and the scar is well formed.  There is no tenderness palpation on surgical site.  Arthrodesis site is stable.  No skin irritation or breakdown identified today.  No signs of infection.   No other open lesions or pre-ulcerative lesions.  No pain with calf compression, swelling, warmth, erythema.   Assessment and Plan:  Status post right foot first MPJ arthrodesis  -Treatment options discussed including all alternatives, risks, and complications -Repeat x-rays obtained reviewed.  Hardware intact but any complicating factors.  Mild increased consolidation across the arthrodesis site but still radiolucency is noted. -Discussed the x-ray finds with him it seems to be asymptomatic at this time with minimal discomfort overall made good improvements.  Discussed with him continue with shoe modifications, stiffer soled shoes, orthotics.  At this point since he is doing well wearing a see him back as needed he agrees with this plan.  If there is any issues let me know he understands.  Trula Slade DPM

## 2020-03-31 ENCOUNTER — Encounter: Payer: Self-pay | Admitting: Family Medicine

## 2020-03-31 ENCOUNTER — Other Ambulatory Visit: Payer: Self-pay

## 2020-03-31 ENCOUNTER — Ambulatory Visit (INDEPENDENT_AMBULATORY_CARE_PROVIDER_SITE_OTHER): Payer: 59 | Admitting: Family Medicine

## 2020-03-31 ENCOUNTER — Ambulatory Visit (INDEPENDENT_AMBULATORY_CARE_PROVIDER_SITE_OTHER): Payer: 59

## 2020-03-31 VITALS — BP 110/70 | HR 69 | Temp 97.6°F | Ht 72.0 in | Wt 214.0 lb

## 2020-03-31 DIAGNOSIS — E119 Type 2 diabetes mellitus without complications: Secondary | ICD-10-CM

## 2020-03-31 DIAGNOSIS — M25532 Pain in left wrist: Secondary | ICD-10-CM

## 2020-03-31 LAB — POCT GLYCOSYLATED HEMOGLOBIN (HGB A1C): Hemoglobin A1C: 6.7 % — AB (ref 4.0–5.6)

## 2020-03-31 MED ORDER — DICLOFENAC SODIUM 75 MG PO TBEC: 75.0000 mg | DELAYED_RELEASE_TABLET | Freq: Two times a day (BID) | ORAL | 0 refills | Status: DC

## 2020-03-31 NOTE — Patient Instructions (Signed)
Get wrist splint and wear next couple of weeks  Continue with intermittent icing.  We will call you with the X-ray results.

## 2020-03-31 NOTE — Progress Notes (Signed)
Established Patient Office Visit  Subjective:  Patient ID: Spencer Duffy, male    DOB: 1965/08/25  Age: 54 y.o. MRN: [id number]  CC:  Chief Complaint  Patient presents with  . Wrist Pain    Pt c/o pain left wrist, started x 6 weeks. L hand is swollen, can't bend wrist down having a lot of pain. He has been applying ice and taking Ibuprofen with no relief.    HPI LINDBERGH WINKLES presents for 6-week history of left wrist pain.  Denies any injury.  He has seen some visible swelling.  Has not noted any warmth or erythema.  He has applied some ice without much improvement.  He took ibuprofen a couple times without much improvement.  He has listed allergy to Aleve but has tolerated Motrin as above and thinks he is taken diclofenac and meloxicam without difficulties in the past.  He has pain with pretty much any movement of the hand or wrist.  No known history of gout or pseudogout.  No other significant arthralgias.  He has type 2 diabetes.  Last A1c was slightly up at 7.1%.  He would like to get this rechecked today.  He remains on Metformin.  Past Medical History:  Diagnosis Date  . Arthritis   . Diabetes mellitus    TYPE II  . GERD (gastroesophageal reflux disease)   . Hay fever    ALLERGIES  . Heart murmur     Past Surgical History:  Procedure Laterality Date  . BILATERAL KNEE ARTHROSCOPY     X2  . ELBOW ARTHROSCOPY WITH TENDON RECONSTRUCTION Right 12/22/2016  . Neuroplasty Digital Nerve Right 06/21/2013   @ [facility]  . ROTATOR CUFF REPAIR Bilateral   . SKIN CANCER EXCISION     ON CHEST  . Tarsal Exostectomy Right 06/21/2013   @ [facility]    Family History  Problem Relation Age of Onset  . Arthritis Other   . Hypertension Other   . Diabetes Other        PARENT, GRANDPARENT  . Lung cancer Father   . Heart attack Father   . Colon cancer Paternal Grandmother        45's  . Diabetes Paternal Grandmother   . Diabetes Sister   . Head & neck cancer Brother   . Diabetes Brother   . Diabetes  Maternal Grandmother   . Stroke Paternal Grandfather     Social History   Socioeconomic History  . Marital status: Married    Spouse name: Not on file  . Number of children: Not on file  . Years of education: Not on file  . Highest education level: Not on file  Occupational History    Employer: [employer]  Tobacco Use  . Smoking status: Never Smoker  . Smokeless tobacco: Former Network engineer  . Vaping Use: Never used  Substance and Sexual Activity  . Alcohol use: Yes    Comment: wine occ   . Drug use: No  . Sexual activity: Not on file  Other Topics Concern  . Not on file  Social History Narrative  . Not on file   Social Determinants of Health   Financial Resource Strain: Not on file  Food Insecurity: Not on file  Transportation Needs: Not on file  Physical Activity: Not on file  Stress: Not on file  Social Connections: Not on file  Intimate Partner Violence: Not on file    Outpatient Medications Prior to Visit  Medication Sig  Dispense Refill  . Eszopiclone 3 MG TABS TAKE 1 TABLET BY MOUTH IMMEDIATELY BEFORE BEDTIME AS NEEDED 30 tablet 0  . metFORMIN (GLUCOPHAGE) 500 MG tablet TAKE 1 TABLET TWICE A DAY  WITH MEALS 180 tablet 3  . Multiple Vitamin (MULTIVITAMIN) tablet Take 1 tablet by mouth daily.    . ramipril (ALTACE) 5 MG capsule Take 1 capsule (5 mg total) by mouth daily. 90 capsule 3  . rosuvastatin (CRESTOR) 10 MG tablet Take 1 tablet (10 mg total) by mouth daily. 90 tablet 3  . diclofenac Sodium (VOLTAREN) 1 % GEL Apply 2 g topically 4 (four) times daily. Rub into affected area of foot 2 to 4 times daily (Patient not taking: Reported on 03/31/2020) 100 g 2  . ondansetron (ZOFRAN ODT) 8 MG disintegrating tablet Take 1 tablet (8 mg total) by mouth every 8 (eight) hours as needed for nausea or vomiting. (Patient not taking: Reported on 03/31/2020) 15 tablet 0  . oxyCODONE-acetaminophen (PERCOCET/ROXICET) 5-325 MG tablet Take 1-2 tablets by mouth every 8  (eight) hours as needed for severe pain. 20 tablet 0  . promethazine (PHENERGAN) 25 MG tablet Take 1 tablet (25 mg total) by mouth every 8 (eight) hours as needed for nausea or vomiting. 20 tablet 0  . tapentadol (NUCYNTA) 50 MG tablet Take 1 tablet (50 mg total) by mouth every 6 (six) hours as needed. 30 tablet 0   No facility-administered medications prior to visit.    Allergies  Allergen Reactions  . Aleve [Naproxen] Other (See Comments)    Pt became very hot and throat was dry and itchy    ROS Review of Systems  Constitutional: Negative for chills and fever.  Respiratory: Negative for shortness of breath.   Cardiovascular: Negative for chest pain.  Hematological: Negative for adenopathy.      Objective:    Physical Exam Vitals reviewed.  Constitutional:      Appearance: Normal appearance.  Cardiovascular:     Rate and Rhythm: Normal rate and regular rhythm.  Pulmonary:     Effort: Pulmonary effort is normal.     Breath sounds: Normal breath sounds.  Musculoskeletal:     Comments: Left wrist reveals some mild swelling over the joint space.  There is no significant warmth or erythema.  He has some pain with flexion extension of the wrist.  He has some mild tenderness over the joint space itself. He has changes of multiple digits consistent with osteoarthritis.  Neurological:     Mental Status: He is alert.     BP 110/70 (BP Location: Left Arm, Patient Position: Sitting, Cuff Size: Large)   Pulse 69   Temp 97.6 F (36.4 C) (Temporal)   Ht 6' (1.829 m)   Wt 214 lb (97.1 kg)   SpO2 96%   BMI 29.02 kg/m  Wt Readings from Last 3 Encounters:  03/31/20 214 lb (97.1 kg)  11/30/19 209 lb 12.8 oz (95.2 kg)  08/30/19 208 lb (94.3 kg)     Health Maintenance Due  Topic Date Due  . Hepatitis C Screening  Never done  . FOOT EXAM  08/10/2013    There are no preventive care reminders to display for this patient.  Lab Results  Component Value Date   TSH 1.79  08/28/2019   Lab Results  Component Value Date   WBC 7.9 08/28/2019   HGB 14.6 08/28/2019   HCT 42.6 08/28/2019   MCV 89.5 08/28/2019   PLT 311.0 08/28/2019   Lab Results  Component Value Date   NA 137 08/28/2019   K 4.4 08/28/2019   CO2 30 08/28/2019   GLUCOSE 147 (H) 08/28/2019   BUN 15 08/28/2019   CREATININE 0.92 08/28/2019   BILITOT 0.4 08/28/2019   ALKPHOS 84 08/28/2019   AST 17 08/28/2019   ALT 28 08/28/2019   PROT 7.0 08/28/2019   ALBUMIN 4.7 08/28/2019   CALCIUM 9.6 08/28/2019   GFR 86.14 08/28/2019   Lab Results  Component Value Date   CHOL 131 08/28/2019   Lab Results  Component Value Date   HDL 43.60 08/28/2019   Lab Results  Component Value Date   LDLCALC 72 08/28/2019   Lab Results  Component Value Date   TRIG 79.0 08/28/2019   Lab Results  Component Value Date   CHOLHDL 3 08/28/2019   Lab Results  Component Value Date   HGBA1C 6.7 (A) 03/31/2020      Assessment & Plan:   Problem List Items Addressed This Visit      Unprioritized   Type II diabetes mellitus, well controlled ([place])   Relevant Orders   POC HgB A1c (Completed)    Other Visit Diagnoses    Left wrist pain    -  Primary   Relevant Orders   DG Wrist Complete Left    -Diabetes is improved with A1c today 6.7% -Continue Metformin and healthy lifestyle habits with exercise and low glycemic diet -Obtain x-rays left wrist -We recommend a wrist brace for the next couple weeks and continue intermittent icing -Start meloxicam 15 mg once daily and reassess in 2 weeks  Meds ordered this encounter  Medications  . diclofenac (VOLTAREN) 75 MG EC tablet    Sig: Take 1 tablet (75 mg total) by mouth 2 (two) times daily.    Dispense:  30 tablet    Refill:  0    Follow-up: No follow-ups on file.    Carolann Littler, MD

## 2020-04-02 NOTE — Progress Notes (Signed)
Wrist x-ray shows no acute bony findings.  Recommend continue icing, try wrist splint (as discussed), and the Meloxicam for 2-3 weeks.  If no better after that let me know.

## 2020-04-21 ENCOUNTER — Encounter: Payer: Self-pay | Admitting: Family Medicine

## 2020-04-21 ENCOUNTER — Ambulatory Visit (INDEPENDENT_AMBULATORY_CARE_PROVIDER_SITE_OTHER): Payer: 59 | Admitting: Family Medicine

## 2020-04-21 ENCOUNTER — Other Ambulatory Visit: Payer: Self-pay

## 2020-04-21 VITALS — BP 120/70 | HR 80 | Temp 98.2°F | Wt 214.0 lb

## 2020-04-21 DIAGNOSIS — M25532 Pain in left wrist: Secondary | ICD-10-CM | POA: Diagnosis not present

## 2020-04-21 MED ORDER — ESZOPICLONE 3 MG PO TABS: ORAL_TABLET | ORAL | 0 refills | Status: DC

## 2020-04-21 NOTE — Progress Notes (Signed)
Established Patient Office Visit  Subjective:  Patient ID: Spencer Duffy, male    DOB: [birth year]  Age: 55 y.o. MRN: [id number]  CC: No chief complaint on file.   HPI [name] presents for follow-up regarding left wrist pain.  He has been taking diclofenac 75 mg twice daily, icing, and wearing wrist splint and has not seen much improvement.  Refer to previous visit note for details  03/31/2020 visit Spencer Duffy presents for 6-week history of left wrist pain.  Denies any injury.  He has seen some visible swelling.  Has not noted any warmth or erythema.  He has applied some ice without much improvement.  He took ibuprofen a couple times without much improvement.  He has listed allergy to Aleve but has tolerated Motrin as above and thinks he is taken diclofenac and meloxicam without difficulties in the past.  He has pain with pretty much any movement of the hand or wrist.  No known history of gout or pseudogout.  No other significant arthralgias.  X-rays revealed no acute bony abnormality.  He has pain and some difficulty fully extending the middle finger.  Mild swelling at the wrist joint.  No other significant arthralgias.  He has obstructive sleep apnea.  He is followed by pulmonary for that.  He was prescribed CPAP but is has some difficulty tolerating.  He has follow-up January 10 and plans to discuss further with them at that time. Infrequently takes Lunesta for severe insomnia  Past Medical History:  Diagnosis Date  . Arthritis   . Diabetes mellitus    TYPE II  . GERD (gastroesophageal reflux disease)   . Hay fever    ALLERGIES  . Heart murmur     Past Surgical History:  Procedure Laterality Date  . BILATERAL KNEE ARTHROSCOPY     X2  . ELBOW ARTHROSCOPY WITH TENDON RECONSTRUCTION Right 12/22/2016  . Neuroplasty Digital Nerve Right 06/21/2013   @ [facility]  . ROTATOR CUFF REPAIR Bilateral   . SKIN CANCER EXCISION     ON CHEST  . Tarsal Exostectomy Right 06/21/2013   @ [facility]     Family History  Problem Relation Age of Onset  . Arthritis Other   . Hypertension Other   . Diabetes Other        PARENT, GRANDPARENT  . Lung cancer Father   . Heart attack Father   . Colon cancer Paternal Grandmother        28's  . Diabetes Paternal Grandmother   . Diabetes Sister   . Head & neck cancer Brother   . Diabetes Brother   . Diabetes Maternal Grandmother   . Stroke Paternal Grandfather     Social History   Socioeconomic History  . Marital status: Married    Spouse name: Not on file  . Number of children: Not on file  . Years of education: Not on file  . Highest education level: Not on file  Occupational History    Employer: [employer]  Tobacco Use  . Smoking status: Never Smoker  . Smokeless tobacco: Former Clinical biochemist  . Vaping Use: Never used  Substance and Sexual Activity  . Alcohol use: Yes    Comment: wine occ   . Drug use: No  . Sexual activity: Not on file  Other Topics Concern  . Not on file  Social History Narrative  . Not on file   Social Determinants of Health   Financial Resource Strain: Not  on file  Food Insecurity: Not on file  Transportation Needs: Not on file  Physical Activity: Not on file  Stress: Not on file  Social Connections: Not on file  Intimate Partner Violence: Not on file    Outpatient Medications Prior to Visit  Medication Sig Dispense Refill  . metFORMIN (GLUCOPHAGE) 500 MG tablet TAKE 1 TABLET TWICE A DAY  WITH MEALS 180 tablet 3  . Multiple Vitamin (MULTIVITAMIN) tablet Take 1 tablet by mouth daily.    . ondansetron (ZOFRAN ODT) 8 MG disintegrating tablet Take 1 tablet (8 mg total) by mouth every 8 (eight) hours as needed for nausea or vomiting. 15 tablet 0  . ramipril (ALTACE) 5 MG capsule Take 1 capsule (5 mg total) by mouth daily. 90 capsule 3  . rosuvastatin (CRESTOR) 10 MG tablet Take 1 tablet (10 mg total) by mouth daily. 90 tablet 3  . Eszopiclone 3 MG TABS TAKE 1 TABLET BY MOUTH IMMEDIATELY  BEFORE BEDTIME AS NEEDED 30 tablet 0  . diclofenac (VOLTAREN) 75 MG EC tablet Take 1 tablet (75 mg total) by mouth 2 (two) times daily. 30 tablet 0  . diclofenac Sodium (VOLTAREN) 1 % GEL Apply 2 g topically 4 (four) times daily. Rub into affected area of foot 2 to 4 times daily (Patient not taking: No sig reported) 100 g 2   No facility-administered medications prior to visit.    Allergies  Allergen Reactions  . Aleve [Naproxen] Other (See Comments)    Pt became very hot and throat was dry and itchy    ROS Review of Systems  Constitutional: Negative for chills and fever.  Neurological: Negative for weakness and numbness.      Objective:    Physical Exam Vitals reviewed.  Constitutional:      Appearance: Normal appearance.  Cardiovascular:     Rate and Rhythm: Normal rate and regular rhythm.  Pulmonary:     Effort: Pulmonary effort is normal.     Breath sounds: Normal breath sounds.  Musculoskeletal:     Comments: Left wrist reveals no specific areas of bony tenderness.  He has fairly good range of motion with flexion and extension.  He does have some pain with extending the left middle finger  Neurological:     Mental Status: He is alert.     BP 120/70 (BP Location: Left Arm, Patient Position: Sitting, Cuff Size: Normal)   Pulse 80   Temp 98.2 F (36.8 C) (Oral)   Wt 214 lb (97.1 kg)   SpO2 98%   BMI 29.02 kg/m  Wt Readings from Last 3 Encounters:  04/21/20 214 lb (97.1 kg)  03/31/20 214 lb (97.1 kg)  11/30/19 209 lb 12.8 oz (95.2 kg)     Health Maintenance Due  Topic Date Due  . Hepatitis C Screening  Never done  . FOOT EXAM  08/10/2013    There are no preventive care reminders to display for this patient.  Lab Results  Component Value Date   TSH 1.79 08/28/2019   Lab Results  Component Value Date   WBC 7.9 08/28/2019   HGB 14.6 08/28/2019   HCT 42.6 08/28/2019   MCV 89.5 08/28/2019   PLT 311.0 08/28/2019   Lab Results  Component Value Date    NA 137 08/28/2019   K 4.4 08/28/2019   CO2 30 08/28/2019   GLUCOSE 147 (H) 08/28/2019   BUN 15 08/28/2019   CREATININE 0.92 08/28/2019   BILITOT 0.4 08/28/2019   ALKPHOS 84 08/28/2019  AST 17 08/28/2019   ALT 28 08/28/2019   PROT 7.0 08/28/2019   ALBUMIN 4.7 08/28/2019   CALCIUM 9.6 08/28/2019   GFR 86.14 08/28/2019   Lab Results  Component Value Date   CHOL 131 08/28/2019   Lab Results  Component Value Date   HDL 43.60 08/28/2019   Lab Results  Component Value Date   LDLCALC 72 08/28/2019   Lab Results  Component Value Date   TRIG 79.0 08/28/2019   Lab Results  Component Value Date   CHOLHDL 3 08/28/2019   Lab Results  Component Value Date   HGBA1C 6.7 (A) 03/31/2020      Assessment & Plan:   Problem List Items Addressed This Visit   None   Visit Diagnoses    Left wrist pain    -  Primary   Relevant Orders   Ambulatory referral to Orthopedic Surgery    Patient has persistent left wrist pain.  Not improved with conservative measures of icing, anti-inflammatory, splinting  -Set up to see hand surgeon whom he has seen in the past -He was requesting refills of Lunesta which he takes infrequently for severe insomnia.  He is followed by pulmonary for his obstructive sleep apnea and has had some difficulties with CPAP and plans to discuss with them  Meds ordered this encounter  Medications  . Eszopiclone 3 MG TABS    Sig: Take immediately before bedtime    Dispense:  30 tablet    Refill:  0    Follow-up: No follow-ups on file.    Carolann Littler, MD

## 2020-04-27 NOTE — Progress Notes (Signed)
HPI M never smoker followed for OSA, Insomnia,  complicated by GERD, DM2, HST 09/27/19- AHI 17.2/ hr, desaturation to 82%, body weight 208 lbs  --------------------------------------------------------------------------------   11/30/19- 15 yoM followed for OSA, Insomnia,  complicated by GERD, DM2, HST 09/27/19- AHI 17.2/ hr, desaturation to 82%, body weight 208 lbs -----no change, never got sleep test results Body weight today 209 lbs Had 2 Phizer Cova We reviewed sleep study and options, driving safety. Lunesta occ,  04/28/20- 54 yoM never smoker followed for OSA, Insomnia,  complicated by GERD, DM2, - Lunesta 3 mg  CPAP auto 5-20/ [facility]- 46.7%, AHI 3.1/ hr                          Has daughter in veterinary school Body weight today- Covid vax-3 Phizer Flu vax-had Waking frequently, complains of pain L wrist. Can't fall back to sleep unless he takes CPAP off. Has done mask fitting.- currently using FF mask.   ROS-see HPI   + = positive Constitutional:    weight loss, night sweats, fevers, chills, fatigue, lassitude. HEENT:    headaches, difficulty swallowing, tooth/dental problems, sore throat,       sneezing, itching, ear ache, nasal congestion, post nasal drip, snoring CV:    chest pain, orthopnea, PND, swelling in lower extremities, anasarca,                                   dizziness, palpitations Resp:   shortness of breath with exertion or at rest.                productive cough,   non-productive cough, coughing up of blood.              change in color of mucus.  wheezing.   Skin:    rash or lesions. GI:  No-   heartburn, indigestion, abdominal pain, nausea, vomiting, diarrhea,                 change in bowel habits, loss of appetite GU: dysuria, change in color of urine, no urgency or frequency.   flank pain. MS:  + joint pain, stiffness, decreased range of motion, back pain. Neuro-     nothing unusual Psych:  change in mood or affect.  depression or anxiety.    memory loss.  OBJ- Physical Exam General- Alert, Oriented, Affect-appropriate, Distress- none acute, + appears fit Skin- rash-none, lesions- none, excoriation- none Lymphadenopathy- none Head- atraumatic            Eyes- Gross vision intact, PERRLA, conjunctivae and secretions clear            Ears- Hearing, canals-normal            Nose- Clear, no-Septal dev, mucus, polyps, erosion, perforation             Throat- Mallampati II-III , mucosa clear , drainage- none, tonsils- atrophic Neck- flexible , trachea midline, no stridor , thyroid nl, carotid no bruit Chest - symmetrical excursion , unlabored           Heart/CV- RRR , no murmur , no gallop  , no rub, nl s1 s2                           - JVD- none , edema- none, stasis changes- none, varices- none  Lung- clear to P&A, wheeze- none, cough- none , dullness-none, rub- none           Chest wall-  Abd-  Br/ Gen/ Rectal- Not done, not indicated Extrem- Neuro- grossly intact to observation

## 2020-04-28 ENCOUNTER — Other Ambulatory Visit: Payer: Self-pay

## 2020-04-28 ENCOUNTER — Ambulatory Visit (INDEPENDENT_AMBULATORY_CARE_PROVIDER_SITE_OTHER): Payer: 59 | Admitting: Internal Medicine

## 2020-04-28 ENCOUNTER — Encounter: Payer: Self-pay | Admitting: Internal Medicine

## 2020-04-28 DIAGNOSIS — F5101 Primary insomnia: Secondary | ICD-10-CM | POA: Diagnosis not present

## 2020-04-28 DIAGNOSIS — G4733 Obstructive sleep apnea (adult) (pediatric): Secondary | ICD-10-CM

## 2020-04-28 MED ORDER — CLONAZEPAM 0.5 MG PO TABS: ORAL_TABLET | ORAL | 0 refills | Status: DC

## 2020-04-28 NOTE — Patient Instructions (Addendum)
We can continue CPAP. Our goal is to wear it at least 4 hours/ night to get the benefit from it. If it is uncomfortable, please let us know.  Script sent to try clonazepam for sleep instead of Lunesta. Try taking it 30 minutes or so before sleep so it hs time to start working.  Please call if we can help

## 2020-05-27 ENCOUNTER — Other Ambulatory Visit: Payer: Self-pay | Admitting: Family Medicine

## 2020-06-10 DIAGNOSIS — G4700 Insomnia, unspecified: Secondary | ICD-10-CM | POA: Insufficient documentation

## 2020-06-10 NOTE — Assessment & Plan Note (Signed)
Likely primary and somatic discomfort issues.  Plan- sleep hygiene discussion. Try clonazepam with discussion

## 2020-06-10 NOTE — Assessment & Plan Note (Signed)
Benefits when worn. Sleep disturbance linked to somatic complaints- discussed. Plan- continue auto 5-20

## 2020-06-20 ENCOUNTER — Encounter: Payer: Self-pay | Admitting: Podiatry

## 2020-06-20 DIAGNOSIS — M2061 Acquired deformities of toe(s), unspecified, right foot: Secondary | ICD-10-CM

## 2020-06-20 DIAGNOSIS — M2062 Acquired deformities of toe(s), unspecified, left foot: Secondary | ICD-10-CM | POA: Diagnosis not present

## 2020-06-24 ENCOUNTER — Other Ambulatory Visit: Payer: Self-pay | Admitting: Family Medicine

## 2020-08-21 ENCOUNTER — Other Ambulatory Visit: Payer: Self-pay | Admitting: Family Medicine

## 2020-08-21 NOTE — Telephone Encounter (Signed)
Last filled 04/21/2020 Last OV 04/21/2020  Ok to fill?

## 2020-08-25 ENCOUNTER — Other Ambulatory Visit: Payer: Self-pay | Admitting: Family Medicine

## 2020-10-21 ENCOUNTER — Other Ambulatory Visit: Payer: Self-pay

## 2020-10-21 ENCOUNTER — Ambulatory Visit (INDEPENDENT_AMBULATORY_CARE_PROVIDER_SITE_OTHER): Payer: 59 | Admitting: Family Medicine

## 2020-10-21 ENCOUNTER — Encounter: Payer: Self-pay | Admitting: Family Medicine

## 2020-10-21 VITALS — BP 102/68 | HR 67 | Temp 98.2°F | Ht 72.0 in | Wt 211.8 lb

## 2020-10-21 DIAGNOSIS — Z0000 Encounter for general adult medical examination without abnormal findings: Secondary | ICD-10-CM | POA: Diagnosis not present

## 2020-10-21 LAB — CBC WITH DIFFERENTIAL/PLATELET
Basophils Absolute: 0 10*3/uL (ref 0.0–0.1)
Basophils Relative: 0.3 % (ref 0.0–3.0)
Eosinophils Absolute: 0.1 10*3/uL (ref 0.0–0.7)
Eosinophils Relative: 1.2 % (ref 0.0–5.0)
HCT: 39.9 % (ref 39.0–52.0)
Hemoglobin: 14 g/dL (ref 13.0–17.0)
Lymphocytes Relative: 26.9 % (ref 12.0–46.0)
Lymphs Abs: 1.6 10*3/uL (ref 0.7–4.0)
MCHC: 35.1 g/dL (ref 30.0–36.0)
MCV: 87.8 fl (ref 78.0–100.0)
Monocytes Absolute: 0.4 10*3/uL (ref 0.1–1.0)
Monocytes Relative: 6.4 % (ref 3.0–12.0)
Neutro Abs: 3.8 10*3/uL (ref 1.4–7.7)
Neutrophils Relative %: 65.2 % (ref 43.0–77.0)
Platelets: 245 10*3/uL (ref 150.0–400.0)
RBC: 4.55 Mil/uL (ref 4.22–5.81)
RDW: 12.7 % (ref 11.5–15.5)
WBC: 5.8 10*3/uL (ref 4.0–10.5)

## 2020-10-21 LAB — TSH: TSH: 2.72 u[IU]/mL (ref 0.35–5.50)

## 2020-10-21 LAB — BASIC METABOLIC PANEL
BUN: 20 mg/dL (ref 6–23)
CO2: 28 mEq/L (ref 19–32)
Calcium: 9.3 mg/dL (ref 8.4–10.5)
Chloride: 102 mEq/L (ref 96–112)
Creatinine, Ser: 1.06 mg/dL (ref 0.40–1.50)
GFR: 79.91 mL/min (ref >60.00)
Glucose, Bld: 123 mg/dL — ABNORMAL HIGH (ref 70–99)
Potassium: 4.5 mEq/L (ref 3.5–5.1)
Sodium: 137 mEq/L (ref 135–145)

## 2020-10-21 LAB — HEPATIC FUNCTION PANEL
ALT: 36 U/L (ref 0–53)
AST: 20 U/L (ref 0–37)
Albumin: 4.5 g/dL (ref 3.5–5.2)
Alkaline Phosphatase: 77 U/L (ref 39–117)
Bilirubin, Direct: 0.1 mg/dL (ref 0.0–0.3)
Total Bilirubin: 0.4 mg/dL (ref 0.2–1.2)
Total Protein: 6.6 g/dL (ref 6.0–8.3)

## 2020-10-21 LAB — LIPID PANEL
Cholesterol: 129 mg/dL (ref 0–200)
HDL: 38.4 mg/dL — ABNORMAL LOW (ref >39.00)
LDL Cholesterol: 77 mg/dL (ref 0–99)
NonHDL: 90.76
Total CHOL/HDL Ratio: 3
Triglycerides: 71 mg/dL (ref 0.0–149.0)
VLDL: 14.2 mg/dL (ref 0.0–40.0)

## 2020-10-21 LAB — HEMOGLOBIN A1C: Hgb A1c MFr Bld: 7.3 % — ABNORMAL HIGH (ref 4.6–6.5)

## 2020-10-21 LAB — PSA: PSA: 0.35 ng/mL (ref 0.10–4.00)

## 2020-10-21 MED ORDER — RAMIPRIL 5 MG PO CAPS: 5.0000 mg | ORAL_CAPSULE | Freq: Every day | ORAL | 3 refills | Status: DC

## 2020-10-21 NOTE — Progress Notes (Signed)
Established Patient Office Visit  Subjective:  Patient ID: Spencer Duffy, male    DOB: 03-27-1966  Age: 55 y.o. MRN: [id number]  CC:  Chief Complaint  Patient presents with   Annual Exam    HPI Spencer Duffy presents for physical exam.  He has medical problems including type 2 diabetes, obstructive sleep apnea, history of GERD, chronic intermittent insomnia.  He is recently been battling some left upper extremity issues with left wrist pain and left elbow pain.  He apparently has some severe tendinitis left elbow.  Followed by orthopedist for that.  Is been on a couple of recent steroid courses.  Right foot pain involving right heel.  Recently had a coffee cup break in thinks he stepped on foreign body.  Last tetanus 2013  Social history-married with 2 children's.  His son is finishing up undergrad and plans to enroll in Arts administrator.  His daughter is in her third year of vet school at [employer].  Non-smoker.  No regular alcohol use.  Family history-Brother and sister with type 2 diabetes.  His father had lung cancer and history of heart disease.  Past Medical History:  Diagnosis Date   Arthritis    Diabetes mellitus    TYPE II   GERD (gastroesophageal reflux disease)    Hay fever    ALLERGIES   Heart murmur     Past Surgical History:  Procedure Laterality Date   BILATERAL KNEE ARTHROSCOPY     X2   ELBOW ARTHROSCOPY WITH TENDON RECONSTRUCTION Right 12/22/2016   Neuroplasty Digital Nerve Right 06/21/2013   @ [place]   ROTATOR CUFF REPAIR Bilateral    SKIN CANCER EXCISION     ON CHEST   Tarsal Exostectomy Right 06/21/2013   @ [place]    Family History  Problem Relation Age of Onset   Arthritis Other    Hypertension Other    Diabetes Other        PARENT, GRANDPARENT   Lung cancer Father    Heart attack Father    Colon cancer Paternal Grandmother        24's   Diabetes Paternal Grandmother    Diabetes Sister    Head & neck cancer Brother    Diabetes Brother     Diabetes Maternal Grandmother    Stroke Paternal Grandfather     Social History   Socioeconomic History   Marital status: Married    Spouse name: Not on file   Number of children: Not on file   Years of education: Not on file   Highest education level: Not on file  Occupational History    Employer: [employer]  Tobacco Use   Smoking status: Never   Smokeless tobacco: Former  Scientific laboratory technician Use: Never used  Substance and Sexual Activity   Alcohol use: Yes    Comment: wine occ    Drug use: No   Sexual activity: Not on file  Other Topics Concern   Not on file  Social History Narrative   Not on file   Social Determinants of Health   Financial Resource Strain: Not on file  Food Insecurity: Not on file  Transportation Needs: Not on file  Physical Activity: Not on file  Stress: Not on file  Social Connections: Not on file  Intimate Partner Violence: Not on file    Outpatient Medications Prior to Visit  Medication Sig Dispense Refill   Eszopiclone 3 MG TABS TAKE 1 TABLET  BY MOUTH IMMEDIATELY BEFORE BEDTIME 30 tablet 0   metFORMIN (GLUCOPHAGE) 500 MG tablet TAKE 1 TABLET TWICE DAILY  WITH MEALS 180 tablet 1   Multiple Vitamin (MULTIVITAMIN) tablet Take 1 tablet by mouth daily.     ondansetron (ZOFRAN ODT) 8 MG disintegrating tablet Take 1 tablet (8 mg total) by mouth every 8 (eight) hours as needed for nausea or vomiting. 15 tablet 0   rosuvastatin (CRESTOR) 10 MG tablet TAKE 1 TABLET DAILY 90 tablet 0   ramipril (ALTACE) 5 MG capsule Take 1 capsule (5 mg total) by mouth daily. 90 capsule 3   clonazePAM (KLONOPIN) 0.5 MG tablet 1-2 tabs for sleep as needed (Patient not taking: Reported on 10/21/2020) 60 tablet 0   No facility-administered medications prior to visit.    Allergies  Allergen Reactions   Aleve [Naproxen] Other (See Comments)    Pt became very hot and throat was dry and itchy    ROS Review of Systems  Constitutional:  Negative for activity change,  appetite change, fatigue and fever.  HENT:  Negative for congestion, ear pain and trouble swallowing.   Eyes:  Negative for pain and visual disturbance.  Respiratory:  Negative for cough, shortness of breath and wheezing.   Cardiovascular:  Negative for chest pain and palpitations.  Gastrointestinal:  Negative for abdominal distention, abdominal pain, blood in stool, constipation, diarrhea, nausea, rectal pain and vomiting.  Genitourinary:  Negative for dysuria, hematuria and testicular pain.  Musculoskeletal:  Negative for arthralgias and joint swelling.  Skin:  Negative for rash.  Neurological:  Negative for dizziness, syncope and headaches.  Hematological:  Negative for adenopathy.  Psychiatric/Behavioral:  Negative for confusion and dysphoric mood.      Objective:    Physical Exam Constitutional:      General: He is not in acute distress.    Appearance: He is well-developed.  HENT:     Head: Normocephalic and atraumatic.     Right Ear: External ear normal.     Left Ear: External ear normal.  Eyes:     Conjunctiva/sclera: Conjunctivae normal.     Pupils: Pupils are equal, round, and reactive to light.  Neck:     Thyroid: No thyromegaly.  Cardiovascular:     Rate and Rhythm: Normal rate and regular rhythm.     Heart sounds: Normal heart sounds. No murmur heard. Pulmonary:     Effort: No respiratory distress.     Breath sounds: No wheezing or rales.  Abdominal:     General: Bowel sounds are normal. There is no distension.     Palpations: Abdomen is soft. There is no mass.     Tenderness: There is no abdominal tenderness. There is no guarding or rebound.  Musculoskeletal:     Cervical back: Normal range of motion and neck supple.  Lymphadenopathy:     Cervical: No cervical adenopathy.  Skin:    Comments: Right foot on the heel reveals whitish discoloration of the surface.  We used some fine-tipped pickups and pulled out small foreign body which looks like a piece of  ceramic from a coffee cup.  No other foreign body seen or felt  Feet reveal no callus.  No ulcerations.  Normal sensory function with monofilament testing with only very minimal impairment of the right great toe otherwise normal  Neurological:     Mental Status: He is alert and oriented to person, place, and time.     Cranial Nerves: No cranial nerve deficit.  Deep Tendon Reflexes: Reflexes normal.    BP 102/68 (BP Location: Left Arm, Patient Position: Sitting, Cuff Size: Normal)   Pulse 67   Temp 98.2 F (36.8 C) (Oral)   Ht 6' (1.829 m)   Wt 211 lb 12.8 oz (96.1 kg)   SpO2 97%   BMI 28.73 kg/m  Wt Readings from Last 3 Encounters:  10/21/20 211 lb 12.8 oz (96.1 kg)  04/28/20 214 lb 3.2 oz (97.2 kg)  04/21/20 214 lb (97.1 kg)     Health Maintenance Due  Topic Date Due   Hepatitis C Screening  Never done   FOOT EXAM  08/10/2013   HEMOGLOBIN A1C  09/29/2020    There are no preventive care reminders to display for this patient.  Lab Results  Component Value Date   TSH 1.79 08/28/2019   Lab Results  Component Value Date   WBC 7.9 08/28/2019   HGB 14.6 08/28/2019   HCT 42.6 08/28/2019   MCV 89.5 08/28/2019   PLT 311.0 08/28/2019   Lab Results  Component Value Date   NA 137 08/28/2019   K 4.4 08/28/2019   CO2 30 08/28/2019   GLUCOSE 147 (H) 08/28/2019   BUN 15 08/28/2019   CREATININE 0.92 08/28/2019   BILITOT 0.4 08/28/2019   ALKPHOS 84 08/28/2019   AST 17 08/28/2019   ALT 28 08/28/2019   PROT 7.0 08/28/2019   ALBUMIN 4.7 08/28/2019   CALCIUM 9.6 08/28/2019   GFR 86.14 08/28/2019   Lab Results  Component Value Date   CHOL 131 08/28/2019   Lab Results  Component Value Date   HDL 43.60 08/28/2019   Lab Results  Component Value Date   LDLCALC 72 08/28/2019   Lab Results  Component Value Date   TRIG 79.0 08/28/2019   Lab Results  Component Value Date   CHOLHDL 3 08/28/2019   Lab Results  Component Value Date   HGBA1C 6.7 (A) 03/31/2020       Assessment & Plan:   Problem List Items Addressed This Visit   None Visit Diagnoses     Physical exam    -  Primary   Relevant Orders   Basic metabolic panel   Lipid panel   CBC with Differential/Platelet   TSH   Hepatic function panel   Hep C Antibody   PSA   Hemoglobin A1c     -Obtain screening labs as above including hepatitis C antibody -Continue with annual flu vaccine -Tetanus booster next year -Other immunizations up-to-date -Colonoscopy up-to-date -Continue with annual diabetic eye exam  Meds ordered this encounter  Medications   ramipril (ALTACE) 5 MG capsule    Sig: Take 1 capsule (5 mg total) by mouth daily.    Dispense:  90 capsule    Refill:  3    Follow-up: No follow-ups on file.    Carolann Littler, MD

## 2020-10-25 LAB — HCV RNA,QUANTITATIVE REAL TIME PCR
HCV Quantitative Log: <1.18 Log IU/mL
HCV RNA, PCR, QN: <15 IU/mL

## 2020-10-25 LAB — HEPATITIS C ANTIBODY
Hepatitis C Ab: REACTIVE — AB
SIGNAL TO CUT-OFF: 14 — ABNORMAL HIGH (ref <1.00)

## 2020-11-16 ENCOUNTER — Other Ambulatory Visit: Payer: Self-pay | Admitting: Family Medicine

## 2020-11-18 ENCOUNTER — Encounter: Payer: Self-pay | Admitting: Family Medicine

## 2020-11-19 ENCOUNTER — Telehealth (INDEPENDENT_AMBULATORY_CARE_PROVIDER_SITE_OTHER): Payer: 59 | Admitting: Family Medicine

## 2020-11-19 ENCOUNTER — Other Ambulatory Visit: Payer: Self-pay

## 2020-11-19 DIAGNOSIS — U071 COVID-19: Secondary | ICD-10-CM | POA: Diagnosis not present

## 2020-11-19 MED ORDER — MOLNUPIRAVIR EUA 200MG CAPSULE: 4.0000 | ORAL_CAPSULE | Freq: Two times a day (BID) | ORAL | 0 refills | Status: AC

## 2020-11-19 NOTE — Progress Notes (Signed)
Patient ID: KEE DEPA, male   DOB: [birth year], 55 y.o.   MRN: [id number]   This visit type was conducted due to national recommendations for restrictions regarding the COVID-19 pandemic in an effort to limit this patient's exposure and mitigate transmission in our community.   Virtual Visit via Video Note  I connected with Florentina Addison on 11/19/20 at  7:30 AM EDT by a video enabled telemedicine application and verified that I am speaking with the correct person using two identifiers.  Location patient: home Location provider:work or home office Persons participating in the virtual visit: patient, provider  I discussed the limitations of evaluation and management by telemedicine and the availability of in person appointments. The patient expressed understanding and agreed to proceed.   HPI: Relate has COVID-19 infection.  He and his family went to [place] this past weekend for a wedding.  On the way back their son felt ill.  Rivka Safer developed some chills and this was followed by some fever, cough, nasal congestion, myalgias, mild diarrhea.  No vomiting.  No dyspnea.  He does have comorbidity of obstructive sleep apnea and type 2 diabetes.  His wife and all 5 other immediate family members have [place].   ROS: See pertinent positives and negatives per HPI.  Past Medical History:  Diagnosis Date   Arthritis    Diabetes mellitus    TYPE II   GERD (gastroesophageal reflux disease)    Hay fever    ALLERGIES   Heart murmur     Past Surgical History:  Procedure Laterality Date   BILATERAL KNEE ARTHROSCOPY     X2   ELBOW ARTHROSCOPY WITH TENDON RECONSTRUCTION Right 12/22/2016   Neuroplasty Digital Nerve Right 06/21/2013   @ [facility]   ROTATOR CUFF REPAIR Bilateral    SKIN CANCER EXCISION     ON CHEST   Tarsal Exostectomy Right 06/21/2013   @ [facility] [facility]    Family History  Problem Relation Age of Onset   Arthritis Other    Hypertension Other    Diabetes Other        PARENT, GRANDPARENT   Lung  cancer Father    Heart attack Father    Colon cancer Paternal Grandmother        72's   Diabetes Paternal Grandmother    Diabetes Sister    Head & neck cancer Brother    Diabetes Brother    Diabetes Maternal Grandmother    Stroke Paternal Grandfather     SOCIAL HX: Non-smoker   Current Outpatient Medications:    clonazePAM (KLONOPIN) 0.5 MG tablet, 1-2 tabs for sleep as needed, Disp: 60 tablet, Rfl: 0   Eszopiclone 3 MG TABS, TAKE 1 TABLET BY MOUTH IMMEDIATELY BEFORE BEDTIME, Disp: 30 tablet, Rfl: 0   meloxicam (MOBIC) 15 MG tablet, Take 15 mg by mouth daily., Disp: , Rfl:    metFORMIN (GLUCOPHAGE) 500 MG tablet, TAKE 1 TABLET TWICE DAILY  WITH MEALS, Disp: 180 tablet, Rfl: 1   molnupiravir EUA 200 mg CAPS, Take 4 capsules (800 mg total) by mouth 2 (two) times daily for 5 days., Disp: 40 capsule, Rfl: 0   Multiple Vitamin (MULTIVITAMIN) tablet, Take 1 tablet by mouth daily., Disp: , Rfl:    ondansetron (ZOFRAN ODT) 8 MG disintegrating tablet, Take 1 tablet (8 mg total) by mouth every 8 (eight) hours as needed for nausea or vomiting., Disp: 15 tablet, Rfl: 0   ramipril (ALTACE) 5 MG capsule, Take 1 capsule (5 mg total) by mouth  daily., Disp: 90 capsule, Rfl: 3   rosuvastatin (CRESTOR) 10 MG tablet, TAKE 1 TABLET DAILY, Disp: 90 tablet, Rfl: 0  EXAM:  VITALS per patient if applicable:  GENERAL: alert, oriented, appears well and in no acute distress  HEENT: atraumatic, conjunttiva clear, no obvious abnormalities on inspection of external nose and ears  NECK: normal movements of the head and neck  LUNGS: on inspection no signs of respiratory distress, breathing rate appears normal, no obvious gross SOB, gasping or wheezing  CV: no obvious cyanosis  MS: moves all visible extremities without noticeable abnormality  PSYCH/NEURO: pleasant and cooperative, no obvious depression or anxiety, speech and thought processing grossly intact  ASSESSMENT AND PLAN:  Discussed the following  assessment and plan:  COVID-19 onset of symptoms 11-18-2020 with positive home test earlier today.  Patient in no respiratory distress  -Emphasize plenty of fluids and rest -We did have a long discussion regarding antiviral therapies.  After discussing pros and cons we decided to send in [place] 4 capsules by mouth twice daily for 5 days -Follow-up immediately for any worsening symptoms -He is aware of isolation guidelines     I discussed the assessment and treatment plan with the patient. The patient was provided an opportunity to ask questions and all were answered. The patient agreed with the plan and demonstrated an understanding of the instructions.   The patient was advised to call back or seek an in-person evaluation if the symptoms worsen or if the condition fails to improve as anticipated.     Carolann Littler, MD

## 2021-01-21 ENCOUNTER — Other Ambulatory Visit: Payer: Self-pay | Admitting: Family Medicine

## 2021-01-21 MED ORDER — ESZOPICLONE 3 MG PO TABS: ORAL_TABLET | ORAL | 0 refills | Status: DC

## 2021-01-21 NOTE — Telephone Encounter (Signed)
Rx printed by mistake.  Last filled 08/21/2020 Last OV 10/21/2020  Ok to fill?

## 2021-01-29 LAB — HM DIABETES EYE EXAM

## 2021-02-04 ENCOUNTER — Encounter: Payer: Self-pay | Admitting: Family Medicine

## 2021-03-19 ENCOUNTER — Other Ambulatory Visit: Payer: Self-pay | Admitting: Family Medicine

## 2021-04-01 NOTE — Progress Notes (Signed)
HPI M never smoker followed for OSA, Insomnia,  complicated by GERD, DM2, HST 09/27/19- AHI 17.2/ hr, desaturation to 82%, body weight 208 lbs  --------------------------------------------------------------------------------   04/28/20- 54 yoM never smoker followed for OSA, Insomnia,  complicated by GERD, DM2, - Lunesta 3 mg  CPAP auto 5-20/ [facility]- 46.7%, AHI 3.1/ hr                          Has daughter in veterinary school Body weight today- Covid vax-3 Phizer Flu vax-had Waking frequently, complains of pain L wrist. Can't fall back to sleep unless he takes CPAP off. Has done mask fitting.- currently using FF mask.   04/02/21- 37 yoM never smoker followed for OSA, Insomnia,  complicated by GERD, DM2, Covid infection August 2022,  - Lunesta 3 mg   Tried clonazepam CPAP auto 5-20/ [employer] Download-  compliance 30%, AHI 4.1/ hr                                                 Body weight today- Covid vax-3 Phizer Flu vax-had -----Patient states that he rarely wears the CPAP through the night, states if he wakes up he has to take it off. States Clonazepam did not work for him. His normal sleep pattern is highly fragmented.  If he wakes he often has difficulty regaining sleep even if he takes his CPAP off.  He travels a lot for work and on short trips he does not take his CPAP. We discussed long-term infrequent use of sleep meds, issues of dependence, tolerance and emphasis on good sleep hygiene, stress management.  CBT as an alternative.  He does not like his current CPAP mask and thinks it would be useful to repeat mask fitting.  Johnnye Sima works very well for him.  We discussed having an alternative medication that he could alternate with to address some of the questions about dependence.  ROS-see HPI   + = positive Constitutional:    weight loss, night sweats, fevers, chills, fatigue, lassitude. HEENT:    headaches, difficulty swallowing, tooth/dental problems, sore throat,        sneezing, itching, ear ache, nasal congestion, post nasal drip, snoring CV:    chest pain, orthopnea, PND, swelling in lower extremities, anasarca,                                   dizziness, palpitations Resp:   shortness of breath with exertion or at rest.                productive cough,   non-productive cough, coughing up of blood.              change in color of mucus.  wheezing.   Skin:    rash or lesions. GI:  No-   heartburn, indigestion, abdominal pain, nausea, vomiting, diarrhea,                 change in bowel habits, loss of appetite GU: dysuria, change in color of urine, no urgency or frequency.   flank pain. MS:  + joint pain, stiffness, decreased range of motion, back pain. Neuro-     nothing unusual Psych:  change in mood or affect.  depression or anxiety.  memory loss.  OBJ- Physical Exam General- Alert, Oriented, Affect-appropriate, Distress- none acute, + appears fit Skin- rash-none, lesions- none, excoriation- none Lymphadenopathy- none Head- atraumatic            Eyes- Gross vision intact, PERRLA, conjunctivae and secretions clear            Ears- Hearing, canals-normal            Nose- Clear, no-Septal dev, mucus, polyps, erosion, perforation             Throat- Mallampati II-III , mucosa clear , drainage- none, tonsils- atrophic Neck- flexible , trachea midline, no stridor , thyroid nl, carotid no bruit Chest - symmetrical excursion , unlabored           Heart/CV- RRR , no murmur , no gallop  , no rub, nl s1 s2                           - JVD- none , edema- none, stasis changes- none, varices- none           Lung- clear to P&A, wheeze- none, cough- none , dullness-none, rub- none           Chest wall-  Abd-  Br/ Gen/ Rectal- Not done, not indicated Extrem- Neuro- grossly intact to observation

## 2021-04-02 ENCOUNTER — Ambulatory Visit (INDEPENDENT_AMBULATORY_CARE_PROVIDER_SITE_OTHER): Payer: 59 | Admitting: Internal Medicine

## 2021-04-02 ENCOUNTER — Other Ambulatory Visit: Payer: Self-pay

## 2021-04-02 ENCOUNTER — Encounter: Payer: Self-pay | Admitting: Internal Medicine

## 2021-04-02 DIAGNOSIS — F5101 Primary insomnia: Secondary | ICD-10-CM

## 2021-04-02 DIAGNOSIS — G4733 Obstructive sleep apnea (adult) (pediatric): Secondary | ICD-10-CM | POA: Diagnosis not present

## 2021-04-02 MED ORDER — ESZOPICLONE 3 MG PO TABS: ORAL_TABLET | ORAL | 5 refills | Status: DC

## 2021-04-02 MED ORDER — TRAZODONE HCL 50 MG PO TABS: ORAL_TABLET | ORAL | 5 refills | Status: DC

## 2021-04-02 NOTE — Patient Instructions (Signed)
We need to try to help you maintain control of the sleep apnea better if we can.  Order- referral for mask fitting  Order- DME [facility]- please add chin strap  Ok to use Lunesta as needed, up to 4-5 nights/ week  Script sent refilling Lunesta and script sent adding Trazodone to try 1 or 2 tabs at bedtime, as an alternative.  Order- referral to Dr Oneal Grout, DDS, Orthodontist   to consider oral appliance as an alternative to CPAP  Please call as needed

## 2021-04-02 NOTE — Assessment & Plan Note (Signed)
We agree we do not want to depend excessively on medication, but we did discuss typical concerns, alternatives and strategies. Plan-add trazodone as an alternative medication to try on nights when he is not taking Lunesta.  It should be safe for him to use Lunesta up to 4 or 5 nights per week.

## 2021-04-02 NOTE — Assessment & Plan Note (Signed)
He feels better when he wears CPAP through the night and we again discussed the medical reasons why treatment is recommended for his moderate sleep apnea.  Plan-continue CPAP auto 5-20.  [place].  Mask fitting referral.  Referred to Dr. Ron Parker to consider oral appliance.

## 2021-04-13 ENCOUNTER — Other Ambulatory Visit: Payer: Self-pay | Admitting: Family Medicine

## 2021-04-14 MED ORDER — ROSUVASTATIN CALCIUM 10 MG PO TABS: 10.0000 mg | ORAL_TABLET | Freq: Every day | ORAL | 0 refills | Status: DC

## 2021-04-28 ENCOUNTER — Ambulatory Visit: Payer: 59 | Admitting: Internal Medicine

## 2021-05-14 ENCOUNTER — Encounter: Payer: Self-pay | Admitting: Internal Medicine

## 2021-05-14 NOTE — Addendum Note (Signed)
Addended by: Elby Beck R on: 05/14/2021 01:29 PM   Modules accepted: Orders

## 2021-05-19 ENCOUNTER — Other Ambulatory Visit (HOSPITAL_BASED_OUTPATIENT_CLINIC_OR_DEPARTMENT_OTHER): Payer: 59 | Admitting: Internal Medicine

## 2021-06-03 ENCOUNTER — Ambulatory Visit (HOSPITAL_BASED_OUTPATIENT_CLINIC_OR_DEPARTMENT_OTHER): Payer: 59 | Attending: Internal Medicine | Admitting: Internal Medicine

## 2021-06-03 DIAGNOSIS — G4733 Obstructive sleep apnea (adult) (pediatric): Secondary | ICD-10-CM

## 2021-06-26 ENCOUNTER — Other Ambulatory Visit: Payer: Self-pay

## 2021-06-30 ENCOUNTER — Other Ambulatory Visit: Payer: Self-pay | Admitting: Family Medicine

## 2021-07-24 ENCOUNTER — Encounter: Payer: Self-pay | Admitting: Internal Medicine

## 2021-07-24 NOTE — Telephone Encounter (Signed)
Received the following message from patient:  ? ?"My insurance [facility] has requested that both of these medications be filled using 90-day supply sent to [facility]. Is this something that you can do? Alternating between Lunesta and Trazadone seems to work pretty well." ? ?Patient was last seen on 04/02/21 and advised to follow up in a year.  ? ?Dr. Annamaria Boots, please advise if you are ok with sending 90 day supplies for the [facility] and trazodone. Thanks!  ?

## 2021-07-25 MED ORDER — TRAZODONE HCL 50 MG PO TABS: ORAL_TABLET | ORAL | 1 refills | Status: DC

## 2021-07-25 MED ORDER — ESZOPICLONE 3 MG PO TABS: ORAL_TABLET | ORAL | 1 refills | Status: DC

## 2021-07-25 NOTE — Telephone Encounter (Signed)
Lunesta and trazodone 90 day scripts sent to [facility]. ?

## 2021-09-15 ENCOUNTER — Other Ambulatory Visit: Payer: Self-pay | Admitting: Family Medicine

## 2021-10-03 ENCOUNTER — Other Ambulatory Visit: Payer: Self-pay | Admitting: Family Medicine

## 2021-11-25 ENCOUNTER — Other Ambulatory Visit: Payer: Self-pay | Admitting: Family Medicine

## 2021-12-04 ENCOUNTER — Ambulatory Visit (INDEPENDENT_AMBULATORY_CARE_PROVIDER_SITE_OTHER): Payer: 59 | Admitting: Family Medicine

## 2021-12-04 ENCOUNTER — Encounter: Payer: Self-pay | Admitting: Family Medicine

## 2021-12-04 VITALS — BP 100/66 | HR 64 | Temp 98.2°F | Ht 71.65 in | Wt 209.0 lb

## 2021-12-04 DIAGNOSIS — Z0000 Encounter for general adult medical examination without abnormal findings: Secondary | ICD-10-CM | POA: Diagnosis not present

## 2021-12-04 DIAGNOSIS — E119 Type 2 diabetes mellitus without complications: Secondary | ICD-10-CM | POA: Diagnosis not present

## 2021-12-04 DIAGNOSIS — Z23 Encounter for immunization: Secondary | ICD-10-CM | POA: Diagnosis not present

## 2021-12-04 DIAGNOSIS — Z125 Encounter for screening for malignant neoplasm of prostate: Secondary | ICD-10-CM | POA: Diagnosis not present

## 2021-12-04 LAB — LIPID PANEL
Cholesterol: 114 mg/dL (ref 0–200)
HDL: 42.8 mg/dL (ref >39.00)
LDL Cholesterol: 57 mg/dL (ref 0–99)
NonHDL: 71.02
Total CHOL/HDL Ratio: 3
Triglycerides: 68 mg/dL (ref 0.0–149.0)
VLDL: 13.6 mg/dL (ref 0.0–40.0)

## 2021-12-04 LAB — CBC WITH DIFFERENTIAL/PLATELET
Basophils Absolute: 0 10*3/uL (ref 0.0–0.1)
Basophils Relative: 0.3 % (ref 0.0–3.0)
Eosinophils Absolute: 0.1 10*3/uL (ref 0.0–0.7)
Eosinophils Relative: 1.2 % (ref 0.0–5.0)
HCT: 39.1 % (ref 39.0–52.0)
Hemoglobin: 13.4 g/dL (ref 13.0–17.0)
Lymphocytes Relative: 33.5 % (ref 12.0–46.0)
Lymphs Abs: 1.9 10*3/uL (ref 0.7–4.0)
MCHC: 34.3 g/dL (ref 30.0–36.0)
MCV: 90.3 fl (ref 78.0–100.0)
Monocytes Absolute: 0.4 10*3/uL (ref 0.1–1.0)
Monocytes Relative: 6.7 % (ref 3.0–12.0)
Neutro Abs: 3.2 10*3/uL (ref 1.4–7.7)
Neutrophils Relative %: 58.3 % (ref 43.0–77.0)
Platelets: 250 10*3/uL (ref 150.0–400.0)
RBC: 4.32 Mil/uL (ref 4.22–5.81)
RDW: 12.8 % (ref 11.5–15.5)
WBC: 5.5 10*3/uL (ref 4.0–10.5)

## 2021-12-04 LAB — HEPATIC FUNCTION PANEL
ALT: 20 U/L (ref 0–53)
AST: 16 U/L (ref 0–37)
Albumin: 4.5 g/dL (ref 3.5–5.2)
Alkaline Phosphatase: 75 U/L (ref 39–117)
Bilirubin, Direct: 0.1 mg/dL (ref 0.0–0.3)
Total Bilirubin: 0.6 mg/dL (ref 0.2–1.2)
Total Protein: 6.4 g/dL (ref 6.0–8.3)

## 2021-12-04 LAB — HEMOGLOBIN A1C: Hgb A1c MFr Bld: 7.3 % — ABNORMAL HIGH (ref 4.6–6.5)

## 2021-12-04 LAB — MICROALBUMIN / CREATININE URINE RATIO
Creatinine,U: 45.5 mg/dL
Microalb Creat Ratio: 1.5 mg/g (ref 0.0–30.0)
Microalb, Ur: <0.7 mg/dL (ref 0.0–1.9)

## 2021-12-04 LAB — BASIC METABOLIC PANEL
BUN: 18 mg/dL (ref 6–23)
CO2: 29 mEq/L (ref 19–32)
Calcium: 9.1 mg/dL (ref 8.4–10.5)
Chloride: 102 mEq/L (ref 96–112)
Creatinine, Ser: 0.93 mg/dL (ref 0.40–1.50)
GFR: 92.76 mL/min (ref >60.00)
Glucose, Bld: 130 mg/dL — ABNORMAL HIGH (ref 70–99)
Potassium: 4.7 mEq/L (ref 3.5–5.1)
Sodium: 137 mEq/L (ref 135–145)

## 2021-12-04 LAB — PSA: PSA: 0.27 ng/mL (ref 0.10–4.00)

## 2021-12-04 LAB — TSH: TSH: 1.6 u[IU]/mL (ref 0.35–5.50)

## 2021-12-04 NOTE — Progress Notes (Signed)
Established Patient Office Visit  Subjective   Patient ID: Spencer Duffy, male    DOB: [birth year]  Age: 56 y.o. MRN: [id number]  Chief Complaint  Patient presents with   Annual Exam   Armpit swelling    Patient complains of left arm pit swelling,     HPI   Makaio is seen for physical exam.  He has history of type 2 diabetes, obstructive sleep apnea, hyperlipidemia, osteoarthritis, GERD.  He has continued to struggle with CPAP.  He has difficulty wearing the mask.  Is getting some benefit even from wearing this a few hours per night.  Health maintenance reviewed  -Needs tetanus booster -Prior hep C screen negative -Shingrix has been completed -Colonoscopy due 2028 -Plans to get flu vaccine later this fall  Social history-married with 2 children's.  His son is finishing up undergrad and plans to enroll in doctorate program at [employer] state.  His daughter is finishing up vet school at [employer] state.  Non-smoker.  No regular alcohol use.   Family history-Brother and sister with type 2 diabetes.  His father had lung cancer and history of heart disease. Past Medical History:  Diagnosis Date   Arthritis    Diabetes mellitus    TYPE II   GERD (gastroesophageal reflux disease)    Hay fever    ALLERGIES   Heart murmur    Past Surgical History:  Procedure Laterality Date   BILATERAL KNEE ARTHROSCOPY     X2   ELBOW ARTHROSCOPY WITH TENDON RECONSTRUCTION Right 12/22/2016   Neuroplasty Digital Nerve Right 06/21/2013   @ [facility]   ROTATOR CUFF REPAIR Bilateral    SKIN CANCER EXCISION     ON CHEST   Tarsal Exostectomy Right 06/21/2013   @ [facility]    reports that he has never smoked. He has quit using smokeless tobacco. He reports current alcohol use. He reports that he does not use drugs. family history includes Arthritis in an other family member; Colon cancer in his paternal grandmother; Diabetes in his brother, maternal grandmother, paternal grandmother, sister, and another family member; Head & neck  cancer in his brother; Heart attack in his father; Hypertension in an other family member; Lung cancer in his father; Stroke in his paternal grandfather. Allergies  Allergen Reactions   Aleve [Naproxen] Other (See Comments)    Pt became very hot and throat was dry and itchy     Review of Systems  Constitutional:  Negative for chills, fever, malaise/fatigue and weight loss.  HENT:  Negative for hearing loss.   Eyes:  Negative for blurred vision and double vision.  Respiratory:  Negative for cough and shortness of breath.   Cardiovascular:  Negative for chest pain, palpitations, orthopnea and leg swelling.  Gastrointestinal:  Negative for abdominal pain, blood in stool, constipation, diarrhea, heartburn, melena, nausea and vomiting.  Genitourinary:  Negative for dysuria, flank pain and hematuria.  Skin:  Negative for rash.  Neurological:  Negative for dizziness, speech change, seizures, loss of consciousness and headaches.  Psychiatric/Behavioral:  Negative for depression.       Objective:     BP 100/66 (BP Location: Left Arm, Patient Position: Sitting, Cuff Size: Normal)   Pulse 64   Temp 98.2 F (36.8 C) (Oral)   Ht 5' 11.65" (1.82 m)   Wt 209 lb (94.8 kg)   SpO2 98%   BMI 28.62 kg/m    Physical Exam Constitutional:      General: He is not in acute distress.  Appearance: He is well-developed.  HENT:     Head: Normocephalic and atraumatic.     Right Ear: External ear normal.     Left Ear: External ear normal.  Eyes:     Conjunctiva/sclera: Conjunctivae normal.     Pupils: Pupils are equal, round, and reactive to light.  Neck:     Thyroid: No thyromegaly.  Cardiovascular:     Rate and Rhythm: Normal rate and regular rhythm.     Heart sounds: Normal heart sounds. No murmur heard. Pulmonary:     Effort: No respiratory distress.     Breath sounds: No wheezing or rales.  Abdominal:     General: Bowel sounds are normal. There is no distension.     Palpations:  Abdomen is soft. There is no mass.     Tenderness: There is no abdominal tenderness. There is no guarding or rebound.  Musculoskeletal:     Cervical back: Normal range of motion and neck supple.  Lymphadenopathy:     Cervical: No cervical adenopathy.  Skin:    Findings: No rash.     Comments: Feet reveal no skin lesions. Good distal foot pulses. Good capillary refill.  Does have some callus formation right great toe greater than left.  No ulceration.  Normal sensation with monofilament testing   Neurological:     Mental Status: He is alert and oriented to person, place, and time.     Cranial Nerves: No cranial nerve deficit.     Deep Tendon Reflexes: Reflexes normal.      No results found for any visits on 12/04/21.    The ASCVD Risk score (Arnett DK, et al., 2019) failed to calculate for the following reasons:   The valid total cholesterol range is 130 to 320 mg/dL    Assessment & Plan:   Problem List Items Addressed This Visit       Unprioritized   Type II diabetes mellitus, well controlled ([place])   Relevant Orders   Microalbumin / creatinine urine ratio   Other Visit Diagnoses     Physical exam    -  Primary   Relevant Orders   Basic metabolic panel   Lipid panel   CBC with Differential/Platelet   TSH   Hepatic function panel   PSA   Hemoglobin A1c     -Recommend annual flu vaccine -Tdap given -Check labs as above -Consider additional medication with SGLT2 or GLP-1 medication if A1c remains elevated over 7.  After long discussion would probably look at SGLT2 medication such as low-dose Jardiance -Continue annual diabetic eye exam  No follow-ups on file.    Carolann Littler, MD

## 2021-12-07 MED ORDER — EMPAGLIFLOZIN 10 MG PO TABS: 10.0000 mg | ORAL_TABLET | Freq: Every day | ORAL | 0 refills | Status: DC

## 2021-12-07 NOTE — Addendum Note (Signed)
Addended by: Nilda Riggs on: 12/07/2021 09:18 AM   Modules accepted: Orders

## 2021-12-14 ENCOUNTER — Other Ambulatory Visit: Payer: Self-pay | Admitting: Family Medicine

## 2021-12-25 ENCOUNTER — Other Ambulatory Visit: Payer: Self-pay | Admitting: Family Medicine

## 2022-01-26 ENCOUNTER — Other Ambulatory Visit: Payer: Self-pay | Admitting: Family Medicine

## 2022-02-01 ENCOUNTER — Other Ambulatory Visit: Payer: Self-pay | Admitting: Family Medicine

## 2022-02-01 LAB — HM DIABETES EYE EXAM

## 2022-02-02 ENCOUNTER — Other Ambulatory Visit: Payer: Self-pay

## 2022-02-02 MED ORDER — ROSUVASTATIN CALCIUM 10 MG PO TABS: 10.0000 mg | ORAL_TABLET | Freq: Every day | ORAL | 0 refills | Status: DC

## 2022-02-08 ENCOUNTER — Encounter: Payer: Self-pay | Admitting: Internal Medicine

## 2022-02-08 MED ORDER — BELSOMRA 20 MG PO TABS: ORAL_TABLET | ORAL | 1 refills | Status: DC

## 2022-02-08 NOTE — Telephone Encounter (Signed)
Script for belsomra sent to [facility]. Try this. We can change to mail order if it works.

## 2022-02-09 ENCOUNTER — Other Ambulatory Visit: Payer: Self-pay | Admitting: Internal Medicine

## 2022-02-09 NOTE — Telephone Encounter (Signed)
Eszopiclone refilled 

## 2022-02-16 ENCOUNTER — Encounter: Payer: Self-pay | Admitting: Family Medicine

## 2022-02-28 ENCOUNTER — Other Ambulatory Visit: Payer: Self-pay | Admitting: Family Medicine

## 2022-03-09 ENCOUNTER — Ambulatory Visit (INDEPENDENT_AMBULATORY_CARE_PROVIDER_SITE_OTHER): Payer: 59 | Admitting: Podiatry

## 2022-03-09 ENCOUNTER — Ambulatory Visit (INDEPENDENT_AMBULATORY_CARE_PROVIDER_SITE_OTHER): Payer: 59

## 2022-03-09 DIAGNOSIS — M2022 Hallux rigidus, left foot: Secondary | ICD-10-CM

## 2022-03-09 DIAGNOSIS — M778 Other enthesopathies, not elsewhere classified: Secondary | ICD-10-CM

## 2022-03-09 DIAGNOSIS — M1990 Unspecified osteoarthritis, unspecified site: Secondary | ICD-10-CM

## 2022-03-09 NOTE — Progress Notes (Signed)
Subjective: Chief Complaint  Patient presents with   Foot Pain    Patient came in today for left hallux pain, start to get worse 3 months ago, rate of pain 6 out of 10 while walking, patient states that he is having some swelling,    56 year old male presents For above concerns.  States he been getting quite a bit of pain to the left big toe joint.  Is made worse in the last 3 months.  He previous underwent first major arthrodesis on the right foot.  He is still getting some numbness and associated pain at times with this but at this point he wants to proceed with surgery on the left foot.  On the right foot he states he has calluses not in the position of the toe.  The previously was recommended him to see a rheumatologist given arthritis in other joints as well.  This was never completed.   Objective: AAO x3, NAD DP/PT pulses palpable bilaterally, CRT less than 3 seconds Arthritic changes present left first MPJ with crepitation with range of motion.  Localized edema but no significant erythema or warmth.  There is no other areas of discomfort noted on the left foot. No pain with calf compression, swelling, warmth, erythema  Assessment: Hallux rigidus left foot  Plan: -All treatment options discussed with the patient including all alternatives, risks, complications.  -X-rays obtained reviewed of the left foot.  Arthritic changes present the first MPJ.  No evidence of acute fracture. -We discussed with conservative as well as surgical treatment options.  He was to proceed with a steroid injection today would also like to proceed with surgery for first MPJ arthrodesis.  I cleaned the skin today with Betadine, alcohol and mixture of 1 cc of dexamethasone phosphate, 0.5% Marcaine plain was infiltrated into the right first MPJ without complications.  Postinjection care discussed.  Tolerated well. -Discussed left first MPJ arthrodesis and after discussion was to proceed with this.  We discussed  other conservative care as well.  Will plan for surgery. -The incision placement as well as the postoperative course was discussed with the patient. I discussed risks of the surgery which include, but not limited to, infection, bleeding, pain, swelling, need for further surgery, delayed or nonhealing, painful or ugly scar, numbness or sensation changes, over/under correction, recurrence, transfer lesions, further deformity, hardware failure, DVT/PE, loss of toe/foot. Patient understands these risks and wishes to proceed with surgery. The surgical consent was reviewed with the patient all 3 pages were signed. No promises or guarantees were given to the outcome of the procedure. All questions were answered to the best of my ability. Before the surgery the patient was encouraged to call the office if there is any further questions. The surgery will be performed at the [facility] on an outpatient basis. -Referral to rheumatology given the arthritis in her joints as well. -Patient encouraged to call the office with any questions, concerns, change in symptoms.   Trula Slade DPM

## 2022-03-09 NOTE — Patient Instructions (Signed)
Pre-Operative Instructions  Congratulations, you have decided to take an important step to improving your quality of life.  You can be assured that the doctors of [facility] will be with you every step of the way.  Plan to be at the surgery center/hospital at least 1 (one) hour prior to your scheduled time unless otherwise directed by the surgical center/hospital staff.  You must have a responsible adult accompany you, remain during the surgery and drive you home.  Make sure you have directions to the surgical center/hospital and know how to get there on time. For hospital based surgery you will need to obtain a history and physical form from your family physician within 1 month prior to the date of surgery- we will give you a form for you primary physician.  We make every effort to accommodate the date you request for surgery.  There are however, times where surgery dates or times have to be moved.  We will contact you as soon as possible if a change in schedule is required.   No Aspirin/Ibuprofen for one week before surgery.  If you are on aspirin, any non-steroidal anti-inflammatory medications (Mobic, Aleve, Ibuprofen) you should stop taking it 7 days prior to your surgery.  You make take Tylenol  For pain prior to surgery.  Medications- If you are taking daily heart and blood pressure medications, seizure, reflux, allergy, asthma, anxiety, pain or diabetes medications, make sure the surgery center/hospital is aware before the day of surgery so they may notify you which medications to take or avoid the day of surgery. No food or drink after midnight the night before surgery unless directed otherwise by surgical center/hospital staff. No alcoholic beverages 24 hours prior to surgery.  No smoking 24 hours prior to or 24 hours after surgery. Wear loose pants or shorts- loose enough to fit over bandages, boots, and casts. No slip on shoes, sneakers are best. Bring your boot with you to the  surgery center/hospital.  Also bring crutches or a walker if your physician has prescribed it for you.  If you do not have this equipment, it will be provided for you after surgery. If you have not been contracted by the surgery center/hospital by the day before your surgery, call to confirm the date and time of your surgery. Leave-time from work may vary depending on the type of surgery you have.  Appropriate arrangements should be made prior to surgery with your employer. Prescriptions will be provided immediately following surgery by your doctor.  Have these filled as soon as possible after surgery and take the medication as directed. Remove nail polish on the operative foot. Wash the night before surgery.  The night before surgery wash the foot and leg well with the antibacterial soap provided and water paying special attention to beneath the toenails and in between the toes.  Rinse thoroughly with water and dry well with a towel.  Perform this wash unless told not to do so by your physician.  Enclosed: 1 Ice pack (please put in freezer the night before surgery)   1 Hibiclens skin cleaner   Pre-op Instructions  If you have any questions regarding the instructions, do not hesitate to call our office at any point during this process.   [place]: [address] [place], [place] [address] [phone]  [place]: [address] [place], [place] [address] [phone]  Dr. Naomie Crow, DPM  

## 2022-03-10 ENCOUNTER — Ambulatory Visit (INDEPENDENT_AMBULATORY_CARE_PROVIDER_SITE_OTHER): Payer: 59 | Admitting: Family Medicine

## 2022-03-10 ENCOUNTER — Encounter: Payer: Self-pay | Admitting: Family Medicine

## 2022-03-10 VITALS — BP 100/60 | HR 67 | Temp 97.8°F | Wt 202.9 lb

## 2022-03-10 DIAGNOSIS — E119 Type 2 diabetes mellitus without complications: Secondary | ICD-10-CM

## 2022-03-10 LAB — POCT GLYCOSYLATED HEMOGLOBIN (HGB A1C): Hemoglobin A1C: 6.4 % — AB (ref 4.0–5.6)

## 2022-03-10 NOTE — Progress Notes (Signed)
Established Patient Office Visit  Subjective   Patient ID: Spencer Duffy, male    DOB: June 25, 1965  Age: 56 y.o. MRN: [id number]  Chief Complaint  Patient presents with   Follow-up    HPI   Rand seen for follow-up regarding type 2 diabetes.  Last summer his A1c was up to 7.3% and we started Jardiance 10 mg daily.  Tolerating well.  He did have some cost issues at 1 point but has been able to get this down.  Also remains on metformin.  He is having ongoing problems with feet.  Had surgery previously for right MTP fusion and is looking at having the same surgery on the left sometime in December.  Feet issues have limited his activity somewhat.  He had urine microalbumin screen back in the summer which was negative.  He has been on ramipril for quite some time for kidney protection.  Past Medical History:  Diagnosis Date   Arthritis    Diabetes mellitus    TYPE II   GERD (gastroesophageal reflux disease)    Hay fever    ALLERGIES   Heart murmur    Past Surgical History:  Procedure Laterality Date   BILATERAL KNEE ARTHROSCOPY     X2   ELBOW ARTHROSCOPY WITH TENDON RECONSTRUCTION Right 12/22/2016   Neuroplasty Digital Nerve Right 06/21/2013   @ [facility]   ROTATOR CUFF REPAIR Bilateral    SKIN CANCER EXCISION     ON CHEST   Tarsal Exostectomy Right 06/21/2013   @ [place]    reports that he has never smoked. He has quit using smokeless tobacco. He reports current alcohol use. He reports that he does not use drugs. family history includes Arthritis in an other family member; Colon cancer in his paternal grandmother; Diabetes in his brother, maternal grandmother, paternal grandmother, sister, and another family member; Head & neck cancer in his brother; Heart attack in his father; Hypertension in an other family member; Lung cancer in his father; Stroke in his paternal grandfather. Allergies  Allergen Reactions   Aleve [Naproxen] Other (See Comments)    Pt became very hot and throat was dry and  itchy    Review of Systems  Constitutional:  Negative for weight loss.  Cardiovascular:  Negative for chest pain.      Objective:     BP 100/60 (BP Location: Left Arm, Patient Position: Sitting, Cuff Size: Normal)   Pulse 67   Temp 97.8 F (36.6 C) (Oral)   Wt 202 lb 14.4 oz (92 kg)   SpO2 97%   BMI 27.78 kg/m    Physical Exam Vitals reviewed.  Constitutional:      Appearance: Normal appearance.  Cardiovascular:     Rate and Rhythm: Normal rate and regular rhythm.  Pulmonary:     Effort: Pulmonary effort is normal.     Breath sounds: Normal breath sounds.  Neurological:     Mental Status: He is alert.      Results for orders placed or performed in visit on 03/10/22  POCT glycosylated hemoglobin (Hb A1C)  Result Value Ref Range   Hemoglobin A1C 6.4 (A) 4.0 - 5.6 %   HbA1c POC (<> result, manual entry)     HbA1c, POC (prediabetic range)     HbA1c, POC (controlled diabetic range)        The ASCVD Risk score (Arnett DK, et al., 2019) failed to calculate for the following reasons:   The valid total cholesterol range is 130 to  320 mg/dL    Assessment & Plan:   Problem List Items Addressed This Visit       Unprioritized   Type II diabetes mellitus, well controlled ([place]) - Primary   Relevant Orders   POCT glycosylated hemoglobin (Hb A1C) (Completed)  Type 2 diabetes improved with A1c reduction of 7.3% to 6.4% with recent addition of Jardiance.  Continue Jardiance 10 mg daily. -We agreed to set up 50-monthfollow-up since he is very well controlled at this point.  Return in about 6 months (around 09/08/2022).    BCarolann Littler MD

## 2022-03-10 NOTE — Patient Instructions (Signed)
A1c has improved to 6.4%  Keep up the good work

## 2022-03-14 ENCOUNTER — Encounter: Payer: Self-pay | Admitting: Family Medicine

## 2022-03-15 MED ORDER — EMPAGLIFLOZIN 10 MG PO TABS: ORAL_TABLET | ORAL | 1 refills | Status: DC

## 2022-03-16 ENCOUNTER — Other Ambulatory Visit: Payer: Self-pay | Admitting: Podiatry

## 2022-03-16 ENCOUNTER — Telehealth: Payer: Self-pay | Admitting: Podiatry

## 2022-03-16 DIAGNOSIS — M2022 Hallux rigidus, left foot: Secondary | ICD-10-CM

## 2022-03-16 DIAGNOSIS — M778 Other enthesopathies, not elsewhere classified: Secondary | ICD-10-CM

## 2022-03-16 DIAGNOSIS — M1990 Unspecified osteoarthritis, unspecified site: Secondary | ICD-10-CM

## 2022-03-16 NOTE — Telephone Encounter (Signed)
DOS: 04/14/2022  [employer] Effective 04/19/2021  Hallux MPJ Fusion Lt [phone])  DX: M20.22  Deductible: $750 with $0 remaining Out-of-Pocket: $3,000 with $629.92 remaining CoInsurance: 20%  Prior authorization is not required per united healthcare website.  Decision ID #:[id number]

## 2022-03-31 NOTE — Progress Notes (Signed)
HPI M never smoker followed for OSA, Insomnia,  complicated by GERD, DM2, HST 09/27/19- AHI 17.2/ hr, desaturation to 82%, body weight 208 lbs  --------------------------------------------------------------------------------   04/02/21- 55 yoM never smoker followed for OSA, Insomnia,  complicated by GERD, DM2, Covid infection August 2022,  - Lunesta 3 mg   Tried clonazepam CPAP auto 5-20/ [employer] [facility]-  compliance 30%, AHI 4.1/ hr                                                 Body weight today- Covid vax-3 Phizer Flu vax-had -----Patient states that he rarely wears the CPAP through the night, states if he wakes up he has to take it off. States Clonazepam did not work for him. His normal sleep pattern is highly fragmented.  If he wakes he often has difficulty regaining sleep even if he takes his CPAP off.  He travels a lot for work and on short trips he does not take his CPAP. We discussed long-term infrequent use of sleep meds, issues of dependence, tolerance and emphasis on good sleep hygiene, stress management.  CBT as an alternative.  He does not like his current CPAP mask and thinks it would be useful to repeat mask fitting.  Spencer Duffy works very well for him.  We discussed having an alternative medication that he could alternate with to address some of the questions about dependence.  04/02/22-  79 yoM never smoker followed for OSA, Insomnia,  complicated by GERD, DM2, Covid infection August 2022,  - Lunesta 3 mg   Tried clonazepam, Belsomra 20,  CPAP auto 5-20/ [facility]-  compliance    66.7%, AHI 2.9/ hr                                          Body weight today- 203 lbs Covid vax-3 Phizer Flu vax-had His CPAP works well.  When traveling he uses an oral appliance which he had refitted by Dr. Ron Parker. Spencer Duffy works very well for his chronic insomnia.  He has wanted to keep a backup medication to minimize risk of developing tolerance but could not tell if Belsomra was effective at  all.  We discussed Ambien as an option and we will give him a prescription to hold.  ROS-see HPI   + = positive Constitutional:    weight loss, night sweats, fevers, chills, fatigue, lassitude. HEENT:    headaches, difficulty swallowing, tooth/dental problems, sore throat,       sneezing, itching, ear ache, nasal congestion, post nasal drip, snoring CV:    chest pain, orthopnea, PND, swelling in lower extremities, anasarca,                                   dizziness, palpitations Resp:   shortness of breath with exertion or at rest.                productive cough,   non-productive cough, coughing up of blood.              change in color of mucus.  wheezing.   Skin:    rash or lesions. GI:  No-   heartburn, indigestion, abdominal pain,  nausea, vomiting, diarrhea,                 change in bowel habits, loss of appetite GU: dysuria, change in color of urine, no urgency or frequency.   flank pain. MS:  + joint pain, stiffness, decreased range of motion, back pain. Neuro-     nothing unusual Psych:  change in mood or affect.  depression or anxiety.   memory loss.  OBJ- Physical Exam General- Alert, Oriented, Affect-appropriate, Distress- none acute, + appears fit Skin- rash-none, lesions- none, excoriation- none Lymphadenopathy- none Head- atraumatic            Eyes- Gross vision intact, PERRLA, conjunctivae and secretions clear            Ears- Hearing, canals-normal            Nose- Clear, no-Septal dev, mucus, polyps, erosion, perforation             Throat- Mallampati II-III , mucosa clear , drainage- none, tonsils- atrophic Neck- flexible , trachea midline, no stridor , thyroid nl, carotid no bruit Chest - symmetrical excursion , unlabored           Heart/CV- RRR , no murmur , no gallop  , no rub, nl s1 s2                           - JVD- none , edema- none, stasis changes- none, varices- none           Lung- clear to P&A, wheeze- none, cough- none , dullness-none, rub- none            Chest wall-  Abd-  Br/ Gen/ Rectal- Not done, not indicated Extrem- Neuro- grossly intact to observation

## 2022-04-02 ENCOUNTER — Ambulatory Visit (INDEPENDENT_AMBULATORY_CARE_PROVIDER_SITE_OTHER): Payer: 59 | Admitting: Internal Medicine

## 2022-04-02 ENCOUNTER — Encounter: Payer: Self-pay | Admitting: Internal Medicine

## 2022-04-02 VITALS — BP 102/62 | HR 72 | Ht 72.0 in | Wt 203.0 lb

## 2022-04-02 DIAGNOSIS — F5101 Primary insomnia: Secondary | ICD-10-CM | POA: Diagnosis not present

## 2022-04-02 DIAGNOSIS — G4733 Obstructive sleep apnea (adult) (pediatric): Secondary | ICD-10-CM | POA: Diagnosis not present

## 2022-04-02 MED ORDER — ZOLPIDEM TARTRATE 10 MG PO TABS: 10.0000 mg | ORAL_TABLET | Freq: Every evening | ORAL | 1 refills | Status: DC | PRN

## 2022-04-02 NOTE — Assessment & Plan Note (Signed)
Chronic problem with fragmented sleep.  Spencer Duffy works well. Plan-as discussed, we will let him hold a prescription for Ambien to use as an alternative, but not in addition to Spencer Duffy.

## 2022-04-02 NOTE — Patient Instructions (Signed)
Ok to continue CPAP auto 5-20  Ok to continue [place]  As alternative, I have sent in script for ambien 10 mg

## 2022-04-02 NOTE — Assessment & Plan Note (Signed)
Good compliance, spending time between CPAP when at home and oral appliance when traveling. Plan-continue AutoPap 5-20 and oral appliance as needed.

## 2022-04-14 ENCOUNTER — Other Ambulatory Visit: Payer: Self-pay | Admitting: Podiatry

## 2022-04-14 DIAGNOSIS — M2022 Hallux rigidus, left foot: Secondary | ICD-10-CM | POA: Diagnosis not present

## 2022-04-14 MED ORDER — PROMETHAZINE HCL 25 MG PO TABS: 25.0000 mg | ORAL_TABLET | Freq: Three times a day (TID) | ORAL | 0 refills | Status: DC | PRN

## 2022-04-14 MED ORDER — OXYCODONE-ACETAMINOPHEN 5-325 MG PO TABS: 1.0000 | ORAL_TABLET | Freq: Four times a day (QID) | ORAL | 0 refills | Status: DC | PRN

## 2022-04-14 MED ORDER — CEPHALEXIN 500 MG PO CAPS: 500.0000 mg | ORAL_CAPSULE | Freq: Three times a day (TID) | ORAL | 0 refills | Status: DC

## 2022-04-14 NOTE — Progress Notes (Signed)
Postop medications sent 

## 2022-04-15 ENCOUNTER — Encounter: Payer: Self-pay | Admitting: Podiatry

## 2022-04-16 ENCOUNTER — Other Ambulatory Visit: Payer: Self-pay | Admitting: Podiatry

## 2022-04-16 MED ORDER — OXYCODONE-ACETAMINOPHEN 10-325 MG PO TABS: 1.0000 | ORAL_TABLET | ORAL | 0 refills | Status: DC | PRN

## 2022-04-20 ENCOUNTER — Ambulatory Visit (INDEPENDENT_AMBULATORY_CARE_PROVIDER_SITE_OTHER): Payer: 59 | Admitting: Podiatry

## 2022-04-20 ENCOUNTER — Encounter: Payer: Self-pay | Admitting: Podiatry

## 2022-04-20 ENCOUNTER — Ambulatory Visit (INDEPENDENT_AMBULATORY_CARE_PROVIDER_SITE_OTHER): Payer: 59

## 2022-04-20 VITALS — BP 144/87 | HR 92

## 2022-04-20 DIAGNOSIS — M2022 Hallux rigidus, left foot: Secondary | ICD-10-CM

## 2022-04-20 DIAGNOSIS — Z9889 Other specified postprocedural states: Secondary | ICD-10-CM

## 2022-04-20 MED ORDER — OXYCODONE HCL 5 MG PO CAPS: ORAL_CAPSULE | ORAL | 0 refills | Status: DC

## 2022-04-23 NOTE — Progress Notes (Signed)
Subjective: Chief Complaint  Patient presents with   Routine Post Op    POV #1 DOS 04/14/2022 LT FOOT FUSION OF TOE JOINT, 1ST MPJ   "Its hurts. I just wish I could walk"   57 year old male presents the office today with above concerns.  He states he is doing better but still having pain.  Pain seems to be improving.  No recent injuries or falls.  No fevers or chills.  No other concerns today.  Objective: AAO x3, NAD DP/PT pulses palpable bilaterally, CRT less than 3 seconds Status post left first MPJ arthrodesis.  Incision appears to be well coapted without any evidence of dehiscence.  There is no surrounding erythema, ascending cellulitis.  No fluctuance or crepitation.  No malodor.  There is edema noted to the surgical site as well as some ecchymosis.  Toes in rectus position.  He is happy with the position. No pain with calf compression, swelling, warmth, erythema  Assessment: Status post left first MPJ arthrodesis, healing well  Plan: -All treatment options discussed with the patient including all alternatives, risks, complications.  -X-rays were obtained and reviewed.  3 views of the foot were obtained.  Status post first MPJ arthrodesis without any complicating factors.  Hardware intact. -Incision was clean.  Small amount of antibiotic ointment was applied followed by dressing.  Keep dressing clean, dry, intact. -Continue nonweightbearing in cam boot. -Ice, elevation. -Pain medication as needed.  I refilled today. -Patient encouraged to call the office with any questions, concerns, change in symptoms.   Trula Slade DPM

## 2022-04-29 ENCOUNTER — Ambulatory Visit (INDEPENDENT_AMBULATORY_CARE_PROVIDER_SITE_OTHER): Payer: 59 | Admitting: Podiatry

## 2022-04-29 VITALS — BP 117/73 | HR 64

## 2022-04-29 DIAGNOSIS — Z9889 Other specified postprocedural states: Secondary | ICD-10-CM

## 2022-04-29 DIAGNOSIS — M2022 Hallux rigidus, left foot: Secondary | ICD-10-CM

## 2022-04-29 NOTE — Progress Notes (Signed)
Patient presents today for post op visit # 2, patient of Dr. Jacqualyn Posey.   POV # 2 DOS 04/14/22 LT FOOT FUSION OF TOE JOINT, 1ST MPJ    Patient presents in their walking boot non-weightbearing. Denies any falls or injury to the foot. Foot is swollen. No signs of infection. No calf pain or shortness of breath. Bandages dry and intact. Incision is intact.  Taking their pain medication as instructed. Dr. Jacqualyn Posey advised patient that he can become partial weightbearing lightly around the house at this time   BP: 117/73 P: 46     Dr. Jacqualyn Posey did take a look at her foot today as well.   Foot redressed today and placed back in the boot. Reviewed icing and elevation. Patient will follow up with Dr. Jacqualyn Posey next 2 weeks for POV# 3 with x-rays.  -  Incision well coapted with sutures intact.  There is no surrounding erythema, ascending cellulitis.  There is no fluctuance or depression.  There is still some swelling present but appears to be improving.  No signs of infection.  He is to transition to partial weightbearing as tolerated.  He can wash it with soap and water, dry thoroughly apply a similar bandage.  Continue cam boot.  Trula Slade DPM

## 2022-05-10 NOTE — Progress Notes (Signed)
Office Visit Note  Patient: Spencer Duffy             Date of Birth: 06-Aug-1965           MRN: [id number]             PCP: Eulas Post, MD Referring: Trula Slade, DPM Visit Date: 05/21/2022 Occupation: '@GUAROCC'$ @  Subjective:  Pain in multiple joints   History of Present Illness: Spencer Duffy is a 57 y.o. male who presents today for new patient consultation as requested by Dr. Jacqualyn Posey.  Patient presents today for further evaluation of pain involving multiple joints.  According to the patient he moved to [facility] 34 years ago and has had 13 different surgeries while living in [place].  He states he has been under the care of several providers at [facility] as well as [facility]-Dr. Jacqualyn Posey.  Patient reports that he previously had bilateral shoulder rotator cuff repairs, bilateral trigger thumb release, left carpal tunnel release, right elbow surgery, several foot surgeries, and arthroscopic surgery in both knees for meniscal tears.  Of note several years ago he experienced pain in his left wrist extending to his left middle finger.  He states he is having difficulty extending his left middle finger during that time.  He also had swelling over the left wrist.  He was initially evaluated by Dr. Mardelle Matte but was referred to the [facility] hand center and was evaluated with Dr. Burney Gauze.  He had an MRI of the left wrist on 09/11/2017 revealing tenosynovitis.  He underwent left wrist surgery in April 2023 at which time there was a carpal tunnel release as well as synovectomy performed.  He has not had any recurrence of pain and swelling in the left wrist joint.  He does have intermittent pain and swelling in both knee joints.  Occasionally the swelling is provoked by physical activity but other times the swelling is unprovoked.  He has not had any knee joint aspirations in the past.   He states he has also been experiencing stiffness in his neck especially with rotation.   Patient reports that he has been told by several providers that he should see a rheumatologist.  He has not had any rheumatologic labs performed in the past. He has no known history of psoriasis, eczema, IBD, or uveitis.  He has severely dry skin and has been under the care of his dermatologist. Patient reports he has a paternal first cousin who was previously diagnosed with rheumatoid arthritis.  He denies any other family history of any rheumatologic conditions. 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  He enjoys hunting and fishing.     Activities of Daily Living:  Patient reports morning stiffness for 0 minutes.   Patient Denies nocturnal pain.  Difficulty dressing/grooming: Denies Difficulty climbing stairs: Reports Difficulty getting out of chair: Denies Difficulty using hands for taps, buttons, cutlery, and/or writing: Denies  Review of Systems  Constitutional: Negative.  Negative for fatigue.  HENT:  Positive for mouth dryness. Negative for mouth sores.   Eyes: Negative.  Negative for dryness.  Respiratory: Negative.  Negative for shortness of breath.   Cardiovascular: Negative.  Negative for chest pain and palpitations.  Gastrointestinal: Negative.  Negative for blood in stool, constipation and diarrhea.  Endocrine: Negative.  Negative for increased urination.  Genitourinary: Negative.  Negative for involuntary urination.  Musculoskeletal:  Positive for joint pain, joint pain, joint swelling, myalgias and myalgias. Negative for gait problem, muscle weakness, morning stiffness and muscle tenderness.  Skin: Negative.  Negative for color change, rash, hair loss and sensitivity to sunlight.  Allergic/Immunologic: Negative.  Negative for susceptible to infections.  Neurological: Negative.  Negative for dizziness and headaches.  Hematological: Negative.  Negative for swollen glands.  Psychiatric/Behavioral:  Negative.  Negative for depressed mood and sleep disturbance. The patient is not nervous/anxious.     PMFS History:  Patient Active Problem List   Diagnosis Date Noted   Insomnia 06/10/2020   Hallux rigidus of right foot 10/09/2019   Obstructive sleep apnea 08/31/2019   Avulsion of skin of elbow, right, initial encounter 08/05/2016   Status post foot surgery 07/03/2013   Type II diabetes mellitus, well controlled ([place]) 06/01/2013   Foot pain, right 05/11/2013   Bone spur of right foot 05/11/2013   Neuralgia of right foot 05/11/2013   Acute right lower quadrant pain 12/14/2012   Right lateral epicondylitis 05/11/2010   SHOULDER PAIN 10/16/2008   AODM 07/17/2008   GERD 07/17/2008    Past Medical History:  Diagnosis Date   Arthritis    Diabetes mellitus    TYPE II   GERD (gastroesophageal reflux disease)    Hay fever    ALLERGIES   Heart murmur     Family History  Problem Relation Age of Onset   Arthritis Other    Hypertension Other    Diabetes Other        PARENT, GRANDPARENT   Lung cancer Father    Heart attack Father    Colon cancer Paternal Grandmother        16's   Diabetes Paternal Grandmother    Diabetes Sister    Head & neck cancer Brother    Diabetes Brother    Diabetes Maternal Grandmother    Stroke Paternal Grandfather    Past Surgical History:  Procedure Laterality Date   BILATERAL KNEE ARTHROSCOPY     X2   ELBOW ARTHROSCOPY WITH TENDON RECONSTRUCTION Right 12/22/2016   Neuroplasty Digital Nerve Right 06/21/2013   @ [place]   ROTATOR CUFF REPAIR Bilateral    SKIN CANCER EXCISION     ON CHEST   Tarsal Exostectomy Right 06/21/2013   @ [place]   Social History   Social History Narrative   Not on file   Immunization History  Administered Date(s) Administered   Hepatitis A 06/17/2000, 01/17/2001   Hepatitis B 03/05/2009, 04/07/2009, 09/24/2009   Influenza Inj Mdck Quad Pf 02/08/2019   Influenza Split 01/28/2012   Influenza Whole 02/07/2009    Influenza,inj,Quad PF,6+ Mos 02/08/2017, 04/07/2018, 02/07/2022   Influenza,inj,Quad PF,6-35 Mos 03/26/2021   Influenza-Unspecified 01/18/2016, 02/11/2020   PFIZER(Purple Top)SARS-COV-2 Vaccination 06/28/2019, 07/23/2019, 02/19/2020   Pneumococcal Conjugate-13 08/10/2012   Td 02/17/2009   Tdap 02/17/2009, 12/04/2021   Typhoid Live 05/22/2018   Zoster Recombinat (Shingrix) 05/19/2018, 09/29/2018     Objective: Vital Signs: BP 123/74 (BP Location: Right Arm, Patient Position: Sitting, Cuff Size: Large)   Pulse 80   Resp 15   Ht '5\' 11"'$  (1.803 m)   Wt 199 lb (90.3 kg)   BMI 27.75 kg/m    Physical Exam Vitals and nursing note reviewed.  Constitutional:      Appearance: He is well-developed.  HENT:     Head: Normocephalic and atraumatic.  Eyes:     Conjunctiva/sclera: Conjunctivae normal.     Pupils: Pupils are equal, round, and reactive to  light.  Cardiovascular:     Rate and Rhythm: Normal rate and regular rhythm.     Heart sounds: Normal heart sounds.  Pulmonary:     Effort: Pulmonary effort is normal.     Breath sounds: Normal breath sounds.  Abdominal:     General: Bowel sounds are normal.     Palpations: Abdomen is soft.  Musculoskeletal:     Cervical back: Normal range of motion and neck supple.  Skin:    General: Skin is warm and dry.     Capillary Refill: Capillary refill takes less than 2 seconds.     Comments: Diffuse xerosis-most severe on dorsal aspect of both hands   Neurological:     Mental Status: He is alert and oriented to person, place, and time.  Psychiatric:        Behavior: Behavior normal.      Musculoskeletal Exam: C-spine has limited ROM with stiffness and discomfort with lateral rotation.  Good flexion and extension of the C-spine.  No midline spinal tenderness.  No SI joint tenderness.  Shoulder joints have good ROM with no tenderness at this time.  Elbow joints have good ROM with no tenderness or swelling.  No evidence of olecranon bursitis.   No tenderness over the medial or lateral epicondyles.  Wrist joints have good ROM with no tenderness or synovitis.  No tenosynovitis noted.  Mild PIP and DIP thickening.  Complete fist formation bilaterally.  Hip joints have good ROM with no groin pain.  Knee joints have good ROM with no warmth or effusion.  Right ankle joint has good ROM with no tenderness or joint swelling.  No evidence of achilles tendonitis or plantar fascitis in the right foot.  No tenderness or synovitis of MTP joints of right foot.  Left foot in a cam boot today.   CDAI Exam: CDAI Score: -- Patient Global: --; Provider Global: -- Swollen: --; Tender: -- Joint Exam 05/21/2022   No joint exam has been documented for this visit   There is currently no information documented on the homunculus. Go to the Rheumatology activity and complete the homunculus joint exam.  Investigation: No additional findings.  Imaging: XR Hand 2 View Left  Result Date: 05/21/2022 CMC, PIP and DIP narrowing was noted.  No MCP, intercarpal or radiocarpal joint space narrowing was noted.  No erosive changes were noted. Impression: These findings are consistent with osteoarthritis of the hand.  XR Hand 2 View Right  Result Date: 05/21/2022 CMC, PIP and DIP narrowing was noted.  No MCP, intercarpal or radiocarpal joint space narrowing was noted.  Possible cyst was noted at the base of the third proximal phalanx. Impression: These findings are consistent with osteoarthritis of the hand.  XR KNEE 3 VIEW RIGHT  Result Date: 05/21/2022 Moderate medial compartment narrowing was noted.  Mild patellofemoral narrowing was noted.  Possible chondrocalcinosis was noted.  Intercondylar osteophytes were noted. Impression: These findings are consistent with moderate osteoarthritis and mild chondromalacia patella.  XR KNEE 3 VIEW LEFT  Result Date: 05/21/2022 Moderate medial compartment narrowing was noted.  Mild to moderate patellofemoral narrowing was noted.   No chondrocalcinosis was noted. Impression: These findings are consistent with moderate osteoarthritis and mild to moderate chondromalacia patella the knee.  XR Cervical Spine 2 or 3 views  Result Date: 05/21/2022 No significant disc space narrowing was noted.  Anterior osteophytes were noted.  Facet joint arthropathy was noted. Impression: These findings are consistent with cervical spondylosis and facet joint arthropathy.  XR Foot 2 Views Right  Result Date: 05/21/2022 Fusion of the first MTP noted with hardware in place.  PIP and DIP narrowing was noted.  No erosive changes were noted.  Dorsal spurring was noted.  No tibiotalar or subtalar joint space narrowing was noted.  Small calcaneal spur was noted. Impression: These findings are consistent with osteoarthritis of the foot.   Recent Labs: Lab Results  Component Value Date   WBC 5.5 12/04/2021   HGB 13.4 12/04/2021   PLT 250.0 12/04/2021   NA 137 12/04/2021   K 4.7 12/04/2021   CL 102 12/04/2021   CO2 29 12/04/2021   GLUCOSE 130 (H) 12/04/2021   BUN 18 12/04/2021   CREATININE 0.93 12/04/2021   BILITOT 0.6 12/04/2021   ALKPHOS 75 12/04/2021   AST 16 12/04/2021   ALT 20 12/04/2021   PROT 6.4 12/04/2021   ALBUMIN 4.5 12/04/2021   CALCIUM 9.1 12/04/2021    Speciality Comments: No specialty comments available.  Procedures:  No procedures performed Allergies: Aleve [naproxen]    Assessment / Plan:     Visit Diagnoses: Polyarthralgia: Patient presents today for further evaluation of chronic pain involving multiple joints.  According to the patient he has had 13 surgeries in the past 34 years since moving to [place].  The surgeries include arthroscopic surgery for both shoulders and both knees, bilateral trigger thumb release, left carpal tunnel release, synovectomy of left wrist, right elbow surgery and bilateral foot surgery.  He has been under the care of [facility], and [facility] over the years.  According to the patient several providers have recommended seeing a rheumatologist in the past but Dr. Earleen Newport recently placed the referral.  He has not undergone rheumatologic workup in the past. He remains most symptomatic in both hands, both knees, both feet, and the C-spine.  On examination he has limited lateral rotation of the C-spine.  No symptoms radiculopathy currently.  He has mild PIP and DIP thickening consistent with osteoarthritis of both hands but no obvious synovitis or dactylitis was noted.  He was able to make a complete fist bilaterally.  Both knee joints have good range of motion with no warmth or effusion.  The left foot was in a Cam walking boot today.  Right foot had no evidence of synovitis.  No evidence of Achilles tendinitis or plantar fasciitis at this time. X-rays of the C-spine, both hands, both knees, and the right foot were obtained today.  The following lab work will also be obtained for further evaluation.  Results will be discussed at his new patient follow-up visit.  Pain in both hands -Family history of rheumatoid arthritis -paternal first cousin.  MRI of right wrist 09/11/2017 revealed tenosynovitis, peritendinitis, tendinopathy and extensor compartment #4 involving the extensor digitorum tendons-S/p synovectomy performed by Dr. Burney Gauze April 2023. No recurrence of tenosynovitis or synovitis.  Patient experiences intermittent pain and stiffness in both hands.  No obvious synovitis was noted on examination today.  He is able to make a complete fist bilaterally.  He had mild PIP and DIP thickening consistent with osteoarthritic changes.  X-rays of both hands were obtained today.  The following lab work was obtained today for further evaluation.   Plan: XR Hand 2 View Right, XR Hand 2 View Left, Rheumatoid factor, Uric acid, C-reactive protein, Sedimentation rate, CK, Cyclic citrul peptide antibody, IgG  S/P trigger finger release - Bilateral thumbs-Dr.  Mardelle Matte. Resolved.  No tenderness or locking  at this time.   H/O synovectomy - April 2023-Left-Dr. Burney Gauze.   Initially evaluated by Dr. Mardelle Matte.  Initially thought to have a ganglion cyst but had an unsuccessful aspiration attempted. MRI of the left wrist on 09/12/2027 was reviewed today in the office: Tenosynovitis, peritendinitis, and tendinopathy in extensor compartment #4 involving the extensor digitorum tendons.  Borderline thickening of the tendons of the extensor compartment #2 without tenosynovitis. He was initially evaluated by Dr. Burney Gauze on 01/06/2021-reviewed office visit note. He tried a cortisone injection as well as Medrol Dosepak which righted temporary relief. He underwent a carpal tunnel release, ossified body removal, and wrist arthroscopy synovectomy fourth compartment. He has not had any recurrence of tenosynovitis or synovitis since then.  On examination he had no tenderness or synovitis upon palpation.  X-rays of both hands were obtained today for further evaluation along with the following lab work.  S/P carpal tunnel release: Left-performed by Dr. Burney Gauze April 2023.  Asymptomatic at this time.   Chronic pain of both knees - He experiences intermittent pain and swelling in both knee joints.  On examination he has good range of motion of both knee joints with no warmth or effusion.  He is not experiencing any mechanical symptoms at this time but occasionally has buckling.  Patient previously underwent arthroscopic surgery for bilateral meniscal tears.  He has not had any joint aspirations in the past.  X-rays of both knees were obtained today for further evaluation.  Plan: XR KNEE 3 VIEW RIGHT, XR KNEE 3 VIEW LEFT  S/P arthroscopic knee surgery - Bilateral. Hx of meniscal tears. Followed by [facility].  Intermittent joint swelling-no history of aspiration.  He has good range of motion of both knee joints on examination today.  No warmth or effusion noted. X-rays  of both knees updated today.  The following lab work will be obtained today for further evaluation.    S/P rotator cuff repair - Bilateral. Hx of right clavicle fracture. Doing well.  Good ROM with no discomfort or tenderness.   Avulsion of skin of elbow, right, initial encounter - Hx of right elbow surgery.  No tenderness or inflammation noted today.   Right lateral epicondylitis - Resolved. No tenderness upon palpation today.   Pain in right foot - X-rays obtained today. - Plan: XR Foot 2 Views Right  Hallux rigidus of both feet -Status post first MPJ arthrodesis-Dr. Jacqualyn Posey. Right-08/08/19. Left-04/14/22: Dr. Jacqualyn Posey.  Status post foot surgery - Bilateral feet-status post first MPJ arthrodesis-Dr. Jacqualyn Posey. Right-08/08/19. Left-04/14/22: Patient was wearing a Cam walking boot on the left foot today.  The right ankle joint had good range of motion with no tenderness or synovitis.  No evidence of Achilles tendinitis or plantar fasciitis.  Some tenderness over the right great toe but no synovitis noted.   Dr. Jacqualyn Posey has been following the patient very closely and has been obtaining x-rays on a regular basis to track the healing process.  He has an upcoming appointment scheduled on 05/25/2022. X-rays of the right foot were obtained today to assess for any radiographic damage or erosive changes.  Neck pain - He presents today with neck pain and stiffness especially with rotation.  C-spine has limited ROM with lateral rotation.  Good flexion and extension noted.  X-rays of the C-spine were obtained today for further evaluation. - Plan: XR Cervical Spine 2 or 3 views  Other medical conditions are listed as follows:  Primary insomnia  Type II diabetes mellitus, well controlled ([place])  Obstructive sleep apnea: Under care of Dr. Annamaria Boots.   Orders: Orders Placed This Encounter  Procedures   XR KNEE 3 VIEW RIGHT   XR KNEE 3 VIEW LEFT   XR Hand 2 View Right   XR Hand 2 View Left   XR Foot 2 Views  Right   XR Cervical Spine 2 or 3 views   Rheumatoid factor   Uric acid   C-reactive protein   Sedimentation rate   CK   Cyclic citrul peptide antibody, IgG   No orders of the defined types were placed in this encounter.    Follow-Up Instructions: Return for NPFU.   Ofilia Neas, PA-C  Note - This record has been created using Dragon software.  Chart creation errors have been sought, but may not always  have been located. Such creation errors do not reflect on  the standard of medical care.

## 2022-05-13 ENCOUNTER — Ambulatory Visit (INDEPENDENT_AMBULATORY_CARE_PROVIDER_SITE_OTHER): Payer: 59

## 2022-05-13 ENCOUNTER — Ambulatory Visit (INDEPENDENT_AMBULATORY_CARE_PROVIDER_SITE_OTHER): Payer: 59 | Admitting: Podiatry

## 2022-05-13 DIAGNOSIS — Z9889 Other specified postprocedural states: Secondary | ICD-10-CM

## 2022-05-13 DIAGNOSIS — M2022 Hallux rigidus, left foot: Secondary | ICD-10-CM

## 2022-05-13 NOTE — Progress Notes (Signed)
Subjective: Chief Complaint  Patient presents with   Foot Problem    POV #3 DOS 04/14/2022 LT FOOT FUSION OF TOE JOINT,    57 year old male presents the office today with above concerns.  Occasional discomfort but appears to be improving.  He denies any fevers or chills.  Still been using the cam boot.  No other concerns.  He states his foot is been going better than the other foot after surgery.  Objective: AAO x3, NAD DP/PT pulses palpable bilaterally, CRT less than 3 seconds Status post left first MPJ arthrodesis.  Incision appears to be well coapted without any evidence of dehiscence.  There is saturated tissue on the incision from recently keeping antibiotic ointment on this.  There is no surrounding erythema, ascending cellulitis.  No fluctuation or crepitation.  There is no malodor.   No pain with calf compression, swelling, warmth, erythema  Assessment: Status post left first MPJ arthrodesis, healing well  Plan: -All treatment options discussed with the patient including all alternatives, risks, complications.  -X-rays were obtained and reviewed.  3 views of the foot were obtained.  Status post first MPJ arthrodesis without any complicating factors.  Hardware intact without any complicating factors.  No evidence of acute fracture. -Small abrasion was applied to the incision followed by dressing.  Discussed she can wash with soap and water, dry thoroughly.  Recommend only a very small amount of antibiotic ointment.  -Continue cam boot.  Consider transition to partial weightbearing and full weightbearing as tolerated in the cam boot. -Ice/elevation -Compression -Monitor for any clinical signs or symptoms of infection and directed to call the office immediately should any occur or go to the ER.  Return in about 2 weeks (around 05/27/2022) for post-op visit, x-ray.  Trula Slade DPM

## 2022-05-21 ENCOUNTER — Ambulatory Visit: Payer: 59

## 2022-05-21 ENCOUNTER — Encounter: Payer: Self-pay | Admitting: Physician Assistant

## 2022-05-21 ENCOUNTER — Ambulatory Visit: Payer: 59 | Attending: Physician Assistant | Admitting: Physician Assistant

## 2022-05-21 ENCOUNTER — Ambulatory Visit (INDEPENDENT_AMBULATORY_CARE_PROVIDER_SITE_OTHER): Payer: 59

## 2022-05-21 VITALS — BP 123/74 | HR 80 | Resp 15 | Ht 71.0 in | Wt 199.0 lb

## 2022-05-21 DIAGNOSIS — M2022 Hallux rigidus, left foot: Secondary | ICD-10-CM

## 2022-05-21 DIAGNOSIS — Z8719 Personal history of other diseases of the digestive system: Secondary | ICD-10-CM

## 2022-05-21 DIAGNOSIS — M542 Cervicalgia: Secondary | ICD-10-CM

## 2022-05-21 DIAGNOSIS — F5101 Primary insomnia: Secondary | ICD-10-CM

## 2022-05-21 DIAGNOSIS — M25561 Pain in right knee: Secondary | ICD-10-CM

## 2022-05-21 DIAGNOSIS — M7711 Lateral epicondylitis, right elbow: Secondary | ICD-10-CM

## 2022-05-21 DIAGNOSIS — M25562 Pain in left knee: Secondary | ICD-10-CM

## 2022-05-21 DIAGNOSIS — M2550 Pain in unspecified joint: Secondary | ICD-10-CM

## 2022-05-21 DIAGNOSIS — S51001A Unspecified open wound of right elbow, initial encounter: Secondary | ICD-10-CM

## 2022-05-21 DIAGNOSIS — G8929 Other chronic pain: Secondary | ICD-10-CM

## 2022-05-21 DIAGNOSIS — G4733 Obstructive sleep apnea (adult) (pediatric): Secondary | ICD-10-CM

## 2022-05-21 DIAGNOSIS — Z9889 Other specified postprocedural states: Secondary | ICD-10-CM | POA: Diagnosis not present

## 2022-05-21 DIAGNOSIS — M79641 Pain in right hand: Secondary | ICD-10-CM

## 2022-05-21 DIAGNOSIS — M79671 Pain in right foot: Secondary | ICD-10-CM

## 2022-05-21 DIAGNOSIS — M79642 Pain in left hand: Secondary | ICD-10-CM

## 2022-05-21 DIAGNOSIS — M1990 Unspecified osteoarthritis, unspecified site: Secondary | ICD-10-CM

## 2022-05-21 DIAGNOSIS — E119 Type 2 diabetes mellitus without complications: Secondary | ICD-10-CM

## 2022-05-21 DIAGNOSIS — M2021 Hallux rigidus, right foot: Secondary | ICD-10-CM

## 2022-05-25 ENCOUNTER — Ambulatory Visit (INDEPENDENT_AMBULATORY_CARE_PROVIDER_SITE_OTHER): Payer: 59 | Admitting: Podiatry

## 2022-05-25 ENCOUNTER — Ambulatory Visit (INDEPENDENT_AMBULATORY_CARE_PROVIDER_SITE_OTHER): Payer: 59

## 2022-05-25 ENCOUNTER — Ambulatory Visit: Payer: 59

## 2022-05-25 DIAGNOSIS — M2022 Hallux rigidus, left foot: Secondary | ICD-10-CM

## 2022-05-25 DIAGNOSIS — Z9889 Other specified postprocedural states: Secondary | ICD-10-CM

## 2022-05-25 LAB — URIC ACID: Uric Acid, Serum: 3.1 mg/dL — ABNORMAL LOW (ref 4.0–8.0)

## 2022-05-25 LAB — CK: Total CK: 62 U/L (ref 44–196)

## 2022-05-25 LAB — CYCLIC CITRUL PEPTIDE ANTIBODY, IGG: Cyclic Citrullin Peptide Ab: <16 UNITS

## 2022-05-25 LAB — C-REACTIVE PROTEIN: CRP: 0.8 mg/L (ref <8.0)

## 2022-05-25 LAB — SEDIMENTATION RATE: Sed Rate: 2 mm/h (ref 0–20)

## 2022-05-25 LAB — RHEUMATOID FACTOR: Rheumatoid fact SerPl-aCnc: <14 IU/mL (ref <14)

## 2022-05-25 NOTE — Progress Notes (Signed)
Subjective: Chief Complaint  Patient presents with   Follow-up    Fusion on left foot, patient stated he is doing well and ready to rid the boot      57 year old male presents the office today with above concerns.  States he has been doing well.  Denies any drainage or pus from the incision.  He is ready get out of the boot.  Occasional discomfort but appears to be improving.  No fevers or chills.  No other concerns.   Objective: AAO x3, NAD DP/PT pulses palpable bilaterally, CRT less than 3 seconds Status post left first MPJ arthrodesis.  Arthrodesis site appears to be intact and stable.  There is a small scab noted along the incision but there is no drainage or pus.  No surrounding erythema, ascending cellulitis.  There is no fluctuance or crepitation.  There is no malodor.  No pain with calf compression, swelling, warmth, erythema  Assessment: Status post left first MPJ arthrodesis, healing well  Plan: -All treatment options discussed with the patient including all alternatives, risks, complications.  -X-rays were obtained and reviewed.  3 views of the foot were obtained.  Status post first MPJ arthrodesis without any complicating factors.  Increased consolidation noted.  No evidence of acute fracture. -Small scab still present next was to come off on its own.  No signs of infection. -Discussed trying to transition to regular shoe as tolerated.  Continue ice, elevate as well as compression With the residual edema.  Return in about 4 weeks (around 06/22/2022) for post-op visit, x-ray left foot.  Trula Slade DPM

## 2022-05-25 NOTE — Progress Notes (Signed)
Lab work will be discussed at new patient follow-up visit.

## 2022-06-05 ENCOUNTER — Other Ambulatory Visit: Payer: Self-pay | Admitting: Family Medicine

## 2022-06-10 NOTE — Progress Notes (Signed)
Office Visit Note  Patient: Spencer Duffy             Date of Birth: 09/16/1965           MRN: [id number]             PCP: Eulas Post, MD Referring: Eulas Post, MD Visit Date: 06/24/2022 Occupation: '@GUAROCC'$ @  Subjective:  Pain in multiple joints  History of Present Illness: ARMSTER SLOVER is a 57 y.o. male with history of osteoarthritis and pain in multiple joints.  He returns today after his initial visit.  He states he continues to have pain and stiffness in his cervical spine and limited lateral rotation.  He was seen by chiropractor in the past which caused worsening of the neck pain.  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  He also does some farm work.  He states he continues to have some pain and stiffness in his hands but he has not noticed any joint swelling.  He has discomfort in his knee joints off-and-on.  He has not noticed any joint swelling.  He had arthroscopic surgery on his bilateral knee joints in the past.  He had right foot surgery by Dr. Jacqualyn Posey and recovered  from it.  He has intermittent discomfort in his feet.  He has not noticed any joint swelling.    Activities of Daily Living:  Patient reports morning stiffness for 0 minutes.   Patient Reports nocturnal pain.  Difficulty dressing/grooming: Denies Difficulty climbing stairs: Denies Difficulty getting out of chair: Denies Difficulty using hands for taps, buttons, cutlery, and/or writing: Denies  Review of Systems  Constitutional:  Positive for fatigue.  HENT:  Positive for mouth dryness.   Eyes: Negative.  Negative for dryness.  Respiratory: Negative.  Negative for shortness of breath.   Cardiovascular: Negative.  Negative for chest pain and palpitations.  Gastrointestinal: Negative.  Negative for blood in stool, constipation and diarrhea.  Endocrine: Negative.  Negative for increased urination.  Genitourinary: Negative.  Negative for involuntary urination.   Musculoskeletal:  Positive for joint pain and joint pain. Negative for gait problem, joint swelling, myalgias, muscle weakness, morning stiffness, muscle tenderness and myalgias.  Skin: Negative.  Negative for pallor, rash, hair loss and sensitivity to sunlight.  Allergic/Immunologic: Negative.  Negative for susceptible to infections.  Neurological: Negative.  Negative for dizziness and headaches.  Hematological: Negative.  Negative for swollen glands.  Psychiatric/Behavioral:  Positive for sleep disturbance. Negative for depressed mood. The patient is not nervous/anxious.     PMFS History:  Patient Active Problem List   Diagnosis Date Noted   Insomnia 06/10/2020   Hallux rigidus of right foot 10/09/2019   Obstructive sleep apnea 08/31/2019   Avulsion of skin of elbow, right, initial encounter 08/05/2016   Status post foot surgery 07/03/2013   Type II diabetes mellitus, well controlled ([place]) 06/01/2013   Foot pain, right 05/11/2013   Bone spur of right foot 05/11/2013   Neuralgia of right foot 05/11/2013   Acute right lower quadrant pain 12/14/2012   Right lateral epicondylitis 05/11/2010   SHOULDER PAIN 10/16/2008   AODM 07/17/2008   GERD 07/17/2008    Past Medical History:  Diagnosis Date   Arthritis    Diabetes mellitus    TYPE II   GERD (gastroesophageal reflux disease)    Hay fever    ALLERGIES   Heart murmur     Family History  Problem Relation Age of  Onset   Arthritis Other    Hypertension Other    Diabetes Other        PARENT, GRANDPARENT   Lung cancer Father    Heart attack Father    Colon cancer Paternal Grandmother        15's   Diabetes Paternal Grandmother    Diabetes Sister    Head & neck cancer Brother    Diabetes Brother    Diabetes Maternal Grandmother    Stroke Paternal Grandfather    Past Surgical History:  Procedure Laterality Date   BILATERAL KNEE ARTHROSCOPY     X2   ELBOW ARTHROSCOPY WITH TENDON RECONSTRUCTION Right 12/22/2016    Neuroplasty Digital Nerve Right 06/21/2013   @ [place]   ROTATOR CUFF REPAIR Bilateral    SKIN CANCER EXCISION     ON CHEST   Tarsal Exostectomy Right 06/21/2013   @ [place]   Social History   Social History Narrative   Not on file   Immunization History  Administered Date(s) Administered   Hepatitis A 06/17/2000, 01/17/2001   Hepatitis B 03/05/2009, 04/07/2009, 09/24/2009   Influenza Inj Mdck Quad Pf 02/08/2019   Influenza Split 01/28/2012   Influenza Whole 02/07/2009   Influenza,inj,Quad PF,6+ Mos 02/08/2017, 04/07/2018, 02/07/2022   Influenza,inj,Quad PF,6-35 Mos 03/26/2021   Influenza-Unspecified 01/18/2016, 02/11/2020   PFIZER(Purple Top)SARS-COV-2 Vaccination 06/28/2019, 07/23/2019, 02/19/2020   Pneumococcal Conjugate-13 08/10/2012   Td 02/17/2009   Tdap 02/17/2009, 12/04/2021   Typhoid Live 05/22/2018   Zoster Recombinat (Shingrix) 05/19/2018, 09/29/2018     Objective: Vital Signs: BP 105/68 (BP Location: Left Arm, Patient Position: Sitting, Cuff Size: Normal)   Pulse 66   Resp 16   Ht 6' (1.829 m)   Wt 201 lb (91.2 kg)   BMI 27.26 kg/m    Physical Exam Vitals and nursing note reviewed.  Constitutional:      Appearance: He is well-developed.  HENT:     Head: Normocephalic and atraumatic.  Eyes:     Conjunctiva/sclera: Conjunctivae normal.     Pupils: Pupils are equal, round, and reactive to light.  Cardiovascular:     Rate and Rhythm: Normal rate and regular rhythm.     Heart sounds: Normal heart sounds.  Pulmonary:     Effort: Pulmonary effort is normal.     Breath sounds: Normal breath sounds.  Abdominal:     General: Bowel sounds are normal.     Palpations: Abdomen is soft.  Musculoskeletal:     Cervical back: Normal range of motion and neck supple.  Skin:    General: Skin is warm and dry.     Capillary Refill: Capillary refill takes less than 2 seconds.  Neurological:     Mental Status: He is alert and oriented to person, place, and time.  Psychiatric:         Behavior: Behavior normal.      Musculoskeletal Exam: He had limited lateral rotation of the cervical spine with some discomfort.  No limitation on flexion extension was noted.  There was no tenderness over thoracic or lumbar spine.  There was no SI joint tenderness.  Shoulders, elbows, wrist joints, MCPs and PIPs been good range of motion.  He had bilateral PIP and DIP thickening with no synovitis.  Hip joints and knee joints were in good range of motion.  No warmth swelling or effusion was noted.  There was no tenderness over ankles or MTPs.  CDAI Exam: CDAI Score: -- Patient Global: --; Provider Global: -- Swollen: --;  Tender: -- Joint Exam 06/24/2022   No joint exam has been documented for this visit   There is currently no information documented on the homunculus. Go to the Rheumatology activity and complete the homunculus joint exam.  Investigation: No additional findings.  Imaging: No results found.  Recent Labs: Lab Results  Component Value Date   WBC 5.5 12/04/2021   HGB 13.4 12/04/2021   PLT 250.0 12/04/2021   NA 137 12/04/2021   K 4.7 12/04/2021   CL 102 12/04/2021   CO2 29 12/04/2021   GLUCOSE 130 (H) 12/04/2021   BUN 18 12/04/2021   CREATININE 0.93 12/04/2021   BILITOT 0.6 12/04/2021   ALKPHOS 75 12/04/2021   AST 16 12/04/2021   ALT 20 12/04/2021   PROT 6.4 12/04/2021   ALBUMIN 4.5 12/04/2021   CALCIUM 9.1 12/04/2021   May 21, 2022 RF negative, anti-CCP negative, CRP 0.8, sed rate 2, uric acid 3.1, CK 62  MRI of right wrist 09/11/2017 revealed tenosynovitis, peritendinitis, tendinopathy and extensor compartment #4 involving the extensor digitorum tendons-S/p synovectomy performed by Dr. Burney Gauze April 2023.   Speciality Comments: No specialty comments available.  Procedures:  No procedures performed Allergies: Aleve [naproxen]   Assessment / Plan:     Visit Diagnoses: Primary osteoarthritis of both hands -he continues to have some stiffness  in his hands.  No synovitis was noted on the examination.  Clinical and radiographic findings are consistent with osteoarthritis.  Labs obtained on May 21, 2022 RF negative, anti-CCP negative, CRP and sed rate were normal.  Uric acid was normal.  Lab results were discussed with the patient at length.  Joint protection and muscle strengthening was discussed.  A handout on hand exercises was given.  Status post bilateral trigger thumb release, left carpal tunnel release, synovectomy LWJ  S/P trigger finger release - Status post bilateral trigger thumb release by Dr. Mardelle Matte.  Doing well.  S/P carpal tunnel release-left - April 2023 by Dr. Burney Gauze doing well  Primary osteoarthritis of both knees -he continues to have pain and discomfort in his bilateral knee joints.  He states he had meniscal tear repair in the past.  No warmth swelling or effusion was noted.  X-rays obtained at the last visit showed bilateral moderate osteoarthritis and chondromalacia patella was noted.  X-ray findings were reviewed with the patient.  He would benefit from muscle strengthening.  He goes to the gym 3 times a week.  A handout on lower extremity exercises was given.  I discussed the option of possible cortisone injection or viscosupplement injections in the future.  He is diabetic and would benefit from viscosupplement injections.  Primary osteoarthritis of both feet -he complains of discomfort in her bilateral feet.  Surgical fusion of the right first MTP was noted.  X-rays were consistent with osteoarthritis.  X-rays obtained at the last visit were reviewed with the patient.  He is followed by Dr. Jacqualyn Posey.  Proper fitting shoes were advised.  S/P rotator cuff repair - History of right clavicle fracture  Right lateral epicondylitis - Resolved.  Hallux rigidus of both feet - Status post bilateral first MTP arthrodesis right August 08, 2019 left April 14, 2022  DDD (degenerative disc disease), cervical -he continues  to have discomfort in his cervical spine and had limited range of motion especially with the lateral rotation.  X-rays showed spondylosis and facet joint arthropathy.  X-rays were reviewed with the patient today.  I will refer him to physical therapy.  Some of the  exercises were demonstrated in the office and a handout was given.  Other medical problems are listed as follows:  Primary insomnia  Type II diabetes mellitus, well controlled ([place])  Obstructive sleep apnea  Family history of rheumatoid arthritis-paternal first cousin  Orders: Orders Placed This Encounter  Procedures   Ambulatory referral to Physical Therapy   No orders of the defined types were placed in this encounter.    Follow-Up Instructions: Return in about 3 months (around 09/24/2022).   Bo Merino, MD  Note - This record has been created using Editor, commissioning.  Chart creation errors have been sought, but may not always  have been located. Such creation errors do not reflect on  the standard of medical care.

## 2022-06-22 ENCOUNTER — Ambulatory Visit: Payer: 59

## 2022-06-22 ENCOUNTER — Ambulatory Visit (INDEPENDENT_AMBULATORY_CARE_PROVIDER_SITE_OTHER): Payer: 59 | Admitting: Podiatry

## 2022-06-22 DIAGNOSIS — M2022 Hallux rigidus, left foot: Secondary | ICD-10-CM | POA: Diagnosis not present

## 2022-06-22 NOTE — Progress Notes (Unsigned)
Subjective: Chief Complaint  Patient presents with   Routine Post Op    POV #5 DOS 04/14/2022 LT FOOT FUSION OF TOE JOINT, 1ST MPJ, NO N/V/F/C/SOB, Patient rates the pain a 3 out of 10, X-rays taken today      57 year old male presents the office today with above concerns.  States he is doing well.  Sp[place]man regular shoe.  Not able to go barefoot quite yet.  1 day he did feel a pop in the great toe joint.  No swelling the next day it was better.  Otherwise he has no concerns.  No fevers or chills.    Objective: AAO x3, NAD DP/PT pulses palpable bilaterally, CRT less than 3 seconds Status post left first MPJ arthrodesis.  Arthrodesis site appears to be intact and stable.  Scar is formed along the incision.  There is minimal edema.  No erythema or warmth.  No significant pain on exam today.  The toe is rectus.   No pain with calf compression, swelling, warmth, erythema  Assessment: Status post left first MPJ arthrodesis, healing well  Plan: -All treatment options discussed with the patient including all alternatives, risks, complications.  -X-rays were obtained and reviewed.  3 views of the foot were obtained.  Status post first MPJ arthrodesis without any complicating factors.  Increased consolidation noted.  No evidence of acute fracture.  No evidence of hardware failure. -Discussed continue with regular shoe and gradually increase activity as tolerated.  Continue ice, elevate.  Return in about 2 months (around 08/22/2022), or if symptoms worsen or fail to improve, for post-op visit, x-ray .  Vivi Barrack DPM

## 2022-06-24 ENCOUNTER — Ambulatory Visit: Payer: 59 | Attending: Rheumatology | Admitting: Rheumatology

## 2022-06-24 ENCOUNTER — Encounter: Payer: Self-pay | Admitting: Rheumatology

## 2022-06-24 VITALS — BP 105/68 | HR 66 | Resp 16 | Ht 72.0 in | Wt 201.0 lb

## 2022-06-24 DIAGNOSIS — M19071 Primary osteoarthritis, right ankle and foot: Secondary | ICD-10-CM | POA: Diagnosis not present

## 2022-06-24 DIAGNOSIS — E119 Type 2 diabetes mellitus without complications: Secondary | ICD-10-CM

## 2022-06-24 DIAGNOSIS — M2021 Hallux rigidus, right foot: Secondary | ICD-10-CM

## 2022-06-24 DIAGNOSIS — M19072 Primary osteoarthritis, left ankle and foot: Secondary | ICD-10-CM

## 2022-06-24 DIAGNOSIS — Z8261 Family history of arthritis: Secondary | ICD-10-CM

## 2022-06-24 DIAGNOSIS — M19041 Primary osteoarthritis, right hand: Secondary | ICD-10-CM

## 2022-06-24 DIAGNOSIS — M170 Bilateral primary osteoarthritis of knee: Secondary | ICD-10-CM

## 2022-06-24 DIAGNOSIS — M19042 Primary osteoarthritis, left hand: Secondary | ICD-10-CM

## 2022-06-24 DIAGNOSIS — Z9889 Other specified postprocedural states: Secondary | ICD-10-CM

## 2022-06-24 DIAGNOSIS — M7711 Lateral epicondylitis, right elbow: Secondary | ICD-10-CM

## 2022-06-24 DIAGNOSIS — G4733 Obstructive sleep apnea (adult) (pediatric): Secondary | ICD-10-CM

## 2022-06-24 DIAGNOSIS — M5030 Other cervical disc degeneration, unspecified cervical region: Secondary | ICD-10-CM

## 2022-06-24 DIAGNOSIS — M2022 Hallux rigidus, left foot: Secondary | ICD-10-CM

## 2022-06-24 DIAGNOSIS — F5101 Primary insomnia: Secondary | ICD-10-CM

## 2022-06-24 NOTE — Patient Instructions (Signed)
Hand Exercises Hand exercises can be helpful for almost anyone. These exercises can strengthen the hands, improve flexibility and movement, and increase blood flow to the hands. These results can make work and daily tasks easier. Hand exercises can be especially helpful for people who have joint pain from arthritis or have nerve damage from overuse (carpal tunnel syndrome). These exercises can also help people who have injured a hand. Exercises Most of these hand exercises are gentle stretching and motion exercises. It is usually safe to do them often throughout the day. Warming up your hands before exercise may help to reduce stiffness. You can do this with gentle massage or by placing your hands in warm water for 10-15 minutes. It is normal to feel some stretching, pulling, tightness, or mild discomfort as you begin new exercises. This will gradually improve. Stop an exercise right away if you feel sudden, severe pain or your pain gets worse. Ask your health care provider which exercises are best for you. Knuckle bend or "claw" fist  Stand or sit with your arm, hand, and all five fingers pointed straight up. Make sure to keep your wrist straight during the exercise. Gently bend your fingers down toward your palm until the tips of your fingers are touching the top of your palm. Keep your big knuckle straight and just bend the small knuckles in your fingers. Hold this position for __________ seconds. Straighten (extend) your fingers back to the starting position. Repeat this exercise 5-10 times with each hand. Full finger fist  Stand or sit with your arm, hand, and all five fingers pointed straight up. Make sure to keep your wrist straight during the exercise. Gently bend your fingers into your palm until the tips of your fingers are touching the middle of your palm. Hold this position for __________ seconds. Extend your fingers back to the starting position, stretching every joint fully. Repeat  this exercise 5-10 times with each hand. Straight fist Stand or sit with your arm, hand, and all five fingers pointed straight up. Make sure to keep your wrist straight during the exercise. Gently bend your fingers at the big knuckle, where your fingers meet your hand, and the middle knuckle. Keep the knuckle at the tips of your fingers straight and try to touch the bottom of your palm. Hold this position for __________ seconds. Extend your fingers back to the starting position, stretching every joint fully. Repeat this exercise 5-10 times with each hand. Tabletop  Stand or sit with your arm, hand, and all five fingers pointed straight up. Make sure to keep your wrist straight during the exercise. Gently bend your fingers at the big knuckle, where your fingers meet your hand, as far down as you can while keeping the small knuckles in your fingers straight. Think of forming a tabletop with your fingers. Hold this position for __________ seconds. Extend your fingers back to the starting position, stretching every joint fully. Repeat this exercise 5-10 times with each hand. Finger spread  Place your hand flat on a table with your palm facing down. Make sure your wrist stays straight as you do this exercise. Spread your fingers and thumb apart from each other as far as you can until you feel a gentle stretch. Hold this position for __________ seconds. Bring your fingers and thumb tight together again. Hold this position for __________ seconds. Repeat this exercise 5-10 times with each hand. Making circles  Stand or sit with your arm, hand, and all five fingers pointed   straight up. Make sure to keep your wrist straight during the exercise. Make a circle by touching the tip of your thumb to the tip of your index finger. Hold for __________ seconds. Then open your hand wide. Repeat this motion with your thumb and each finger on your hand. Repeat this exercise 5-10 times with each hand. Thumb  motion  Sit with your forearm resting on a table and your wrist straight. Your thumb should be facing up toward the ceiling. Keep your fingers relaxed as you move your thumb. Lift your thumb up as high as you can toward the ceiling. Hold for __________ seconds. Bend your thumb across your palm as far as you can, reaching the tip of your thumb for the small finger (pinkie) side of your palm. Hold for __________ seconds. Repeat this exercise 5-10 times with each hand. Grip strengthening  Hold a stress ball or other soft ball in the middle of your hand. Slowly increase the pressure, squeezing the ball as much as you can without causing pain. Think of bringing the tips of your fingers into the middle of your palm. All of your finger joints should bend when doing this exercise. Hold your squeeze for __________ seconds, then relax. Repeat this exercise 5-10 times with each hand. Contact a health care provider if: Your hand pain or discomfort gets much worse when you do an exercise. Your hand pain or discomfort does not improve within 2 hours after you exercise. If you have any of these problems, stop doing these exercises right away. Do not do them again unless your health care provider says that you can. Get help right away if: You develop sudden, severe hand pain or swelling. If this happens, stop doing these exercises right away. Do not do them again unless your health care provider says that you can. This information is not intended to replace advice given to you by your health care provider. Make sure you discuss any questions you have with your health care provider. Document Revised: 07/17/2020 Document Reviewed: 07/24/2020 Else[employer] Patient Education  [place]. Exercises for Chronic Knee Pain Chronic knee pain is pain that lasts longer than 3 months. For most people with chronic knee pain, exercise and weight loss is an important part of treatment. Your health care provider may want  you to focus on: Strengthening the muscles that support your knee. This can take pressure off your knee and lessen pain. Preventing knee stiffness. Maintaining or increasing how far you can move your knee. Losing weight (if this applies) to take pressure off your knee, decrease your risk for injury, and make it easier for you to exercise. Your health care provider will help you develop an exercise program that matches your needs and physical abilities. Below are simple, low-impact exercises you can do at home. Ask your health care provider or a physical therapist how often you should do your exercise program and how many times to repeat each exercise. General safety tips Follow these safety tips for exercising with chronic knee pain: Get your health care provider's approval before doing any exercises. Start slowly and stop any time an exercise causes pain. Do not exercise if your knee pain is flaring up. Warm up first. Stretching a cold muscle can cause an injury. Do 5-10 minutes of easy movement or light stretching before beginning your exercise routine. Do 5-10 minutes of low-impact activity (like walking or cycling) before starting strengthening exercises. Contact your health care provider any time you have pain  during or after exercising. Exercise may cause discomfort but should not be painful. It is normal to be a little stiff or sore after exercising.  Stretching and range-of-motion exercises Front thigh stretch  Stand up straight and support your body by holding on to a chair or resting one hand on a wall. With your legs straight and close together, bend one knee to lift your heel up toward your buttocks. Using one hand for support, grab your ankle with your free hand. Pull your foot up closer toward your buttocks to feel the stretch in front of your thigh. Hold the stretch for 30 seconds. Repeat __________ times. Complete this exercise __________ times a day. Back thigh stretch  Sit  on the floor with your back straight and your legs out straight in front of you. Place the palms of your hands on the floor and slide them toward your feet as you bend at the hip. Try to touch your nose to your knees and feel the stretch in the back of your thighs. Hold for 30 seconds. Repeat __________ times. Complete this exercise __________ times a day. Calf stretch  Stand facing a wall. Place the palms of your hands flat against the wall, arms extended, and lean slightly against the wall. Get into a lunge position with one leg bent at the knee and the other leg stretched out straight behind you. Keep both feet facing the wall and increase the bend in your knee while keeping the heel of the other leg flat on the ground. You should feel the stretch in your calf. Hold for 30 seconds. Repeat __________ times. Complete this exercise __________ times a day. Strengthening exercises Straight leg lift Lie on your back with one knee bent and the other leg out straight. Slowly lift the straight leg without bending the knee. Lift until your foot is about 12 inches (30 cm) off the floor. Hold for 3-5 seconds and slowly lower your leg. Repeat __________ times. Complete this exercise __________ times a day. Single leg dip Stand between two chairs and put both hands on the backs of the chairs for support. Extend one leg out straight with your body weight resting on the heel of the standing leg. Slowly bend your standing knee to dip your body to the level that is comfortable for you. Hold for 3-5 seconds. Repeat __________ times. Complete this exercise __________ times a day. Hamstring curls Stand straight, knees close together, facing the back of a chair. Hold on to the back of a chair with both hands. Keep one leg straight. Bend the other knee while bringing the heel up toward the buttock until the knee is bent at a 90-degree angle (right angle). Hold for 3-5 seconds. Repeat __________ times.  Complete this exercise __________ times a day. Wall squat Stand straight with your back, hips, and head against a wall. Step forward one foot at a time with your back still against the wall. Your feet should be 2 feet (61 cm) from the wall at shoulder width. Keeping your back, hips, and head against the wall, slide down the wall to as close of a sitting position as you can get. Hold for 5-10 seconds, then slowly slide back up. Repeat __________ times. Complete this exercise __________ times a day. Step-ups Step up with one foot onto a sturdy platform or stool that is about 6 inches (15 cm) high. Face sideways with one foot on the platform and one on the ground. Place all your weight on  the platform foot and lift your body off the ground until your knee extends. Let your other leg hang free to the side. Hold for 3-5 seconds then slowly lower your weight down to the floor foot. Repeat __________ times. Complete this exercise __________ times a day. Contact a health care provider if: Your exercise causes pain. Your pain is worse after you exercise. Your pain prevents you from doing your exercises. This information is not intended to replace advice given to you by your health care provider. Make sure you discuss any questions you have with your health care provider. Document Revised: 08/09/2019 Document Reviewed: 04/02/2019 Elsevier Patient Education  [place].   Cervical Strain and Sprain Rehab Ask your health care provider which exercises are safe for you. Do exercises exactly as told by your health care provider and adjust them as directed. It is normal to feel mild stretching, pulling, tightness, or discomfort as you do these exercises. Stop right away if you feel sudden pain or your pain gets worse. Do not begin these exercises until told by your health care provider. Stretching and range-of-motion exercises Cervical side bending  Using good posture, sit on a stable chair or  stand up. Without moving your shoulders, slowly tilt your left / right ear to your shoulder until you feel a stretch in the neck muscles on the opposite side. You should be looking straight ahead. Hold for __________ seconds. Repeat with the other side of your neck. Repeat __________ times. Complete this exercise __________ times a day. Cervical rotation  Using good posture, sit on a stable chair or stand up. Slowly turn your head to the side as if you are looking over your left / right shoulder. Keep your eyes level with the ground. Stop when you feel a stretch along the side and the back of your neck. Hold for __________ seconds. Repeat this by turning to your other side. Repeat __________ times. Complete this exercise __________ times a day. Thoracic extension and pectoral stretch  Roll a towel or a small blanket so it is about 4 inches (10 cm) in diameter. Lie down on your back on a firm surface. Put the towel in the middle of your back across your spine. It should not be under your shoulder blades. Put your hands behind your head and let your elbows fall out to your sides. Hold for __________ seconds. Repeat __________ times. Complete this exercise __________ times a day. Strengthening exercises Upper cervical flexion  Lie on your back with a thin pillow behind your head or a small, rolled-up towel under your neck. Gently tuck your chin toward your chest and nod your head down to look toward your feet. Do not lift your head off the pillow. Hold for __________ seconds. Release the tension slowly. Relax your neck muscles completely before you repeat this exercise. Repeat __________ times. Complete this exercise __________ times a day. Cervical extension  Stand about 6 inches (15 cm) away from a wall, with your back facing the wall. Place a soft object, about 6-8 inches (15-20 cm) in diameter, between the back of your head and the wall. A soft object could be a small pillow, a  ball, or a folded towel. Gently tilt your head back and press into the soft object. Keep your jaw and forehead relaxed. Hold for __________ seconds. Release the tension slowly. Relax your neck muscles completely before you repeat this exercise. Repeat __________ times. Complete this exercise __________ times a day. Posture and body  mechanics Body mechanics refer to the movements and positions of your body while you do your daily activities. Posture is part of body mechanics. Good posture and healthy body mechanics can help to relieve stress in your body's tissues and joints. Good posture means that your spine is in its natural S-curve position (your spine is neutral), your shoulders are pulled back slightly, and your head is not tipped forward. The following are general guidelines for using improved posture and body mechanics in your everyday activities. Sitting  When sitting, keep your spine neutral and keep your feet flat on the floor. Use a footrest, if needed, and keep your thighs parallel to the floor. Avoid rounding your shoulders. Avoid tilting your head forward. When working at a desk or a computer, keep your desk at a height where your hands are slightly lower than your elbows. Slide your chair under your desk so you are close enough to maintain good posture. When working at a computer, place your monitor at a height where you are looking straight ahead and you do not have to tilt your head forward or downward to look at the screen. Standing  When standing, keep your spine neutral and keep your feet about hip-width apart. Keep a slight bend in your knees. Your ears, shoulders, and hips should line up. When you do a task in which you stand in one place for a long time, place one foot up on a stable object that is 2-4 inches (5-10 cm) high, such as a footstool. This helps keep your spine neutral. Resting When lying down and resting, avoid positions that are most painful for you. Try to  support your neck in a neutral position. You can use a contour pillow or a small rolled-up towel. Your pillow should support your neck but not push on it. This information is not intended to replace advice given to you by your health care provider. Make sure you discuss any questions you have with your health care provider. Document Revised: 10/26/2021 Document Reviewed: 10/26/2021 Elsevier Patient Education  [place].

## 2022-07-09 ENCOUNTER — Other Ambulatory Visit: Payer: Self-pay | Admitting: Family Medicine

## 2022-07-18 ENCOUNTER — Other Ambulatory Visit: Payer: Self-pay | Admitting: Family Medicine

## 2022-07-28 ENCOUNTER — Ambulatory Visit (HOSPITAL_BASED_OUTPATIENT_CLINIC_OR_DEPARTMENT_OTHER): Payer: 59 | Admitting: Physical Therapy

## 2022-08-13 ENCOUNTER — Ambulatory Visit (HOSPITAL_BASED_OUTPATIENT_CLINIC_OR_DEPARTMENT_OTHER): Payer: 59 | Admitting: Physical Therapy

## 2022-08-23 ENCOUNTER — Ambulatory Visit: Payer: 59 | Admitting: Podiatry

## 2022-08-25 ENCOUNTER — Encounter (HOSPITAL_BASED_OUTPATIENT_CLINIC_OR_DEPARTMENT_OTHER): Payer: Self-pay | Admitting: Physical Therapy

## 2022-08-25 ENCOUNTER — Ambulatory Visit (HOSPITAL_BASED_OUTPATIENT_CLINIC_OR_DEPARTMENT_OTHER): Payer: 59 | Attending: Rheumatology | Admitting: Physical Therapy

## 2022-08-25 ENCOUNTER — Other Ambulatory Visit: Payer: Self-pay

## 2022-08-25 DIAGNOSIS — M5030 Other cervical disc degeneration, unspecified cervical region: Secondary | ICD-10-CM | POA: Insufficient documentation

## 2022-08-25 DIAGNOSIS — M542 Cervicalgia: Secondary | ICD-10-CM

## 2022-08-25 NOTE — Therapy (Signed)
OUTPATIENT PHYSICAL THERAPY CERVICAL EVALUATION   Patient Name: ZAKARIA BUMAN MRN: [id number] DOB:Feb 03, 1966, 57 y.o., male Today's Date: 08/25/2022  END OF SESSION:  PT End of Session - 08/25/22 1621     Visit Number 1    Number of Visits 14    Date for PT Re-Evaluation 11/23/22    Authorization Type [employer]    PT Start Time 1315    PT Stop Time 1400    PT Time Calculation (min) 45 min    Activity Tolerance Patient tolerated treatment well    Behavior During Therapy WFL for tasks assessed/performed             Past Medical History:  Diagnosis Date   Arthritis    Diabetes mellitus    TYPE II   GERD (gastroesophageal reflux disease)    Hay fever    ALLERGIES   Heart murmur    Past Surgical History:  Procedure Laterality Date   BILATERAL KNEE ARTHROSCOPY     X2   ELBOW ARTHROSCOPY WITH TENDON RECONSTRUCTION Right 12/22/2016   Neuroplasty Digital Nerve Right 06/21/2013   @ [facility]   ROTATOR CUFF REPAIR Bilateral    SKIN CANCER EXCISION     ON CHEST   Tarsal Exostectomy Right 06/21/2013   @ [facility]   Patient Active Problem List   Diagnosis Date Noted   Insomnia 06/10/2020   Hallux rigidus of right foot 10/09/2019   Obstructive sleep apnea 08/31/2019   Avulsion of skin of elbow, right, initial encounter 08/05/2016   Status post foot surgery 07/03/2013   Type II diabetes mellitus, well controlled (HCC) 06/01/2013   Foot pain, right 05/11/2013   Bone spur of right foot 05/11/2013   Neuralgia of right foot 05/11/2013   Acute right lower quadrant pain 12/14/2012   Right lateral epicondylitis 05/11/2010   SHOULDER PAIN 10/16/2008   AODM 07/17/2008   GERD 07/17/2008    PCP: Kristian Covey, MD   REFERRING PROVIDER:   Pollyann Savoy, MD    REFERRING DIAG:  Diagnosis  M50.30 (ICD-10-CM) - DDD (degenerative disc disease), cervical    THERAPY DIAG:  Cervicalgia  Rationale for Evaluation and Treatment: Rehabilitation  ONSET DATE: Feb 2024  SUBJECTIVE:                                                                                                                                                                                                          SUBJECTIVE STATEMENT: Pt states the pain started just prior to Feb. Pt had gone to the chiro but has  had no relief. Pt has difficulty with rotation, especially with driving. He finds that this is the most limiting and concerning due to his travel for work. Pt was testing and does not have RA. Pt has known OA into the neck following a recent x-ray. Pt is a back sleeper and turning his head is extremely uncomfortable when waking up after a night. Moving from a static position is very uncomfortable. Pt notes that thin pillows make it worse. Pt denies NT or UE weakness. Pt denies 5D's 3N's. Pt states the pain feels deep. Pain does not linger for more than 20-30s. Pt has tried light AROM but is unsure of how much to push.    Hand dominance: Right  PERTINENT HISTORY:  Bilat RC repairs, elbow arthroscopy, DM2, sleep apnea  PAIN:  Are you having pain? No: NPRS scale: 0/10; 6/10 at worst Pain location: central C/S Pain description: sharp pain with turning Aggravating factors: rotational motions, waking up in the morning, driving  Relieving factors: rest,   PRECAUTIONS: None  WEIGHT BEARING RESTRICTIONS: No  FALLS:  Has patient fallen in last 6 months? 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  Anterior osteophytes were  noted.  Facet joint arthropathy was noted.   Impression: These findings are consistent with cervical spondylosis and  facet joint  arthropathy.  PATIENT SURVEYS:  FOTO 59 68 @ DC  COGNITION: Overall cognitive status: Within functional limits for tasks assessed  SENSATION: WFL  POSTURE: rounded shoulders and increased thoracic kyphosis mild L C/S rotation   PALPATION: TTP and hypertonicity of R> L UT, C/S paraspinals, rhomboids, and levator   CERVICAL ROM:   Active ROM A/PROM (deg) eval  Flexion 100%  Extension 75%  Right lateral flexion 30%  Left lateral flexion 30%  Right rotation 50% p!  Left rotation 60% p!   (Blank rows = not tested)  UPPER EXTREMITY ROM: full symmetrical ROM without recreation of C/S Pain  UPPER EXTREMITY MMT:  MMT Right eval Left eval  Shoulder flexion 4+/5 4+/5  Shoulder extension    Shoulder abduction 4+/5 4+/5  Shoulder adduction    Shoulder extension    Shoulder internal rotation 4+/5 4+/5  Shoulder external rotation 4+/5 4+/5  Middle trapezius 4+/5 4+/5   (Blank rows = not tested)  CERVICAL SPECIAL TESTS:  Cranial cervical flexion test: Positive, Spurling's test: Negative, and Distraction test: Positive Deficit greater on L than R with CCFT   TODAY'S TREATMENT:  DATE: 5/8    Exercises - Seated Cervical Retraction  - 2-3 x daily - 7 x weekly - 1 sets - 10 reps - Seated Cervical Retraction and Rotation  - 2-3 x daily - 7 x weekly - 1 sets - 10 reps - Seated Levator Scapulae Stretch  - 2 x daily - 7 x weekly - 1 sets - 3 reps - 30 hold - Seated Assisted Cervical Rotation with Towel  - 2 x daily - 7 x weekly - 3 sets - 10 reps  PATIENT EDUCATION: Education details: MOI, diagnosis, prognosis, anatomy, exercise progression, DOMS expectations, acceptable levels of pain, thermotherapy, massage therapy,  envelope of function, HEP, POC  Person educated: Patient Education method: Explanation, Demonstration, Tactile cues, Verbal cues, and  Handouts Education comprehension: verbalized understanding, returned demonstration, verbal cues required, tactile cues required, and needs further education   HOME EXERCISE PROGRAM:  Access Code: [email or web address] URL: [email or web address] Date: 08/25/2022 Prepared by: Zebedee Iba     ASSESSMENT:   CLINICAL IMPRESSION: Patient is a 57 y.o. male who was seen today for physical therapy evaluation and treatment for c/c of cervicalgia. Pt's s/s appear consistent with cervical OA and stiffness without signs of radiculopathy. Pt's pain is moderately sensitive and irritable with movement. Pt's is more stiffness dominant at this time. Pt did have good response to manual at first session. Plan to continue with cervical mobility and AROM as tolerated at future sessions.  Consider manual therapy with focus on joint mobs and STM as tolerated. Pt would benefit from continued skilled therapy in order to reach goals and maximize functional bilat shoulder strength and ROM for return to ADL and exercise for management of chronic health issues.   OBJECTIVE IMPAIRMENTS decreased strength, impaired UE functional use, improper body mechanics, postural dysfunction, pain, and hypomobility .    ACTIVITY LIMITATIONS community activity, occupation, dressing, shopping, bathing, self-care, and exercise/recreation .    PERSONAL FACTORS: 1-2comorbidities, and Time since onset of injury/illness/exacerbation are also affecting patient's functional outcome.     REHAB POTENTIAL: Good   CLINICAL DECISION MAKING: stable/uncomplicated   EVALUATION COMPLEXITY: Low   GOALS: SHORT TERM GOALS: Target Date 10/06/2022     Pt  will become independent with final HEP in order to demonstrate synthesis of PT education on pain management and progression of upper quarter function.      Goal status: INITIAL   2.  Pt will be able to demonstrate ability to perform AROM C/S without pain in order to demonstrate functional  improvement in upper quarter for self-care and house hold duties.      Goal status: INITIAL   3.  Pt will be able to demonstrate at least 75% cervical rotation  in order to demonstrate functional improvement in upper quarter and cervical ROM for driving related tasks.      Goal status: INITIAL      LONG TERM GOALS: Target Date 11/17/2022     Pt  will become independent with final HEP in order to demonstrate synthesis of PT education on pain management and progression of upper quarter function.      Goal status: INITIAL   2.  Pt will be able to demonstrate/report ability to sit/stand/sleep for extended periods of time without pain in order to demonstrate functional improvement and tolerance to static positioning.     Goal status: INITIAL   3.  Pt will score >/= 68 on FOTO to demonstrate improvement in perceived neck function.      PLAN: PT  FREQUENCY: 1x/week   PT DURATION: 12weeks  (likely D/C in 6-8)    PLANNED INTERVENTIONS: Therapeutic exercises, Therapeutic activity, Neuromuscular re-education, Balance training, Gait training, Patient/Family education, Self Care, Joint mobilization, Joint manipulation, DME instructions, Aquatic Therapy, Dry Needling, Electrical stimulation, Spinal manipulation, Spinal mobilization, Cryotherapy, Moist heat, scar mobilization, Splintting, Taping, Vasopneumatic device, Traction, Ultrasound, Ionotophoresis 4mg /ml Dexamethasone, Manual therapy, and Re-evaluation   PLAN FOR NEXT SESSION: cervical mobs/manual traction;  cervical mobility as tolerated       Zebedee Iba, PT 08/25/2022, 4:33 PM

## 2022-08-31 ENCOUNTER — Other Ambulatory Visit: Payer: Self-pay | Admitting: Internal Medicine

## 2022-09-01 NOTE — Telephone Encounter (Signed)
Please advise on refill request   Allergies  Allergen Reactions   Aleve [Naproxen] Other (See Comments)    Pt became very hot and throat was dry and itchy     Current Outpatient Medications:    cephALEXin (KEFLEX) 500 MG capsule, Take 1 capsule (500 mg total) by mouth 3 (three) times daily. (Patient not taking: Reported on 06/24/2022), Disp: 21 capsule, Rfl: 0   Eszopiclone 3 MG TABS, TAKE 1 TABLET AT BEDTIME ASNEEDED FOR SLEEP, Disp: 90 tablet, Rfl: 1   JARDIANCE 10 MG TABS tablet, TAKE 1 TABLET DAILY BEFORE BREAKFAST, Disp: 90 tablet, Rfl: 1   metFORMIN (GLUCOPHAGE) 500 MG tablet, TAKE 1 TABLET TWICE DAILY  WITH MEALS, Disp: 180 tablet, Rfl: 0   Multiple Vitamin (MULTIVITAMIN) tablet, Take 1 tablet by mouth daily., Disp: , Rfl:    ondansetron (ZOFRAN ODT) 8 MG disintegrating tablet, Take 1 tablet (8 mg total) by mouth every 8 (eight) hours as needed for nausea or vomiting. (Patient not taking: Reported on 06/24/2022), Disp: 15 tablet, Rfl: 0   oxycodone (OXY-IR) 5 MG capsule, Take one to two tablets every 6 hours as needed for severe pain (Patient not taking: Reported on 06/24/2022), Disp: 20 capsule, Rfl: 0   promethazine (PHENERGAN) 25 MG tablet, Take 1 tablet (25 mg total) by mouth every 8 (eight) hours as needed for nausea or vomiting. (Patient not taking: Reported on 06/24/2022), Disp: 20 tablet, Rfl: 0   ramipril (ALTACE) 5 MG capsule, TAKE 1 CAPSULE DAILY, Disp: 90 capsule, Rfl: 2   rosuvastatin (CRESTOR) 10 MG tablet, TAKE 1 TABLET DAILY, Disp: 30 tablet, Rfl: 5   zolpidem (AMBIEN) 10 MG tablet, Take 1 tablet (10 mg total) by mouth at bedtime as needed for sleep., Disp: 30 tablet, Rfl: 1

## 2022-09-01 NOTE — Telephone Encounter (Signed)
Zolpidem refilled.

## 2022-09-02 ENCOUNTER — Ambulatory Visit (HOSPITAL_BASED_OUTPATIENT_CLINIC_OR_DEPARTMENT_OTHER): Payer: 59 | Admitting: Physical Therapy

## 2022-09-02 ENCOUNTER — Encounter (HOSPITAL_BASED_OUTPATIENT_CLINIC_OR_DEPARTMENT_OTHER): Payer: Self-pay | Admitting: Physical Therapy

## 2022-09-02 DIAGNOSIS — M542 Cervicalgia: Secondary | ICD-10-CM

## 2022-09-02 NOTE — Therapy (Signed)
OUTPATIENT PHYSICAL THERAPY CERVICAL TREATMENT   Patient Name: Spencer Duffy MRN: [id number] DOB:April 13, 1966, 57 y.o., male Today's Date: 09/02/2022  END OF SESSION:  PT End of Session - 09/02/22 0842     Visit Number 2    Number of Visits 14    Date for PT Re-Evaluation 11/23/22    Authorization Type [employer]    PT Start Time 0845    PT Stop Time 0925    PT Time Calculation (min) 40 min    Activity Tolerance Patient tolerated treatment well    Behavior During Therapy WFL for tasks assessed/performed             Past Medical History:  Diagnosis Date   Arthritis    Diabetes mellitus    TYPE II   GERD (gastroesophageal reflux disease)    Hay fever    ALLERGIES   Heart murmur    Past Surgical History:  Procedure Laterality Date   BILATERAL KNEE ARTHROSCOPY     X2   ELBOW ARTHROSCOPY WITH TENDON RECONSTRUCTION Right 12/22/2016   Neuroplasty Digital Nerve Right 06/21/2013   @ [facility]   ROTATOR CUFF REPAIR Bilateral    SKIN CANCER EXCISION     ON CHEST   Tarsal Exostectomy Right 06/21/2013   @ [facility]   Patient Active Problem List   Diagnosis Date Noted   Insomnia 06/10/2020   Hallux rigidus of right foot 10/09/2019   Obstructive sleep apnea 08/31/2019   Avulsion of skin of elbow, right, initial encounter 08/05/2016   Status post foot surgery 07/03/2013   Type II diabetes mellitus, well controlled (HCC) 06/01/2013   Foot pain, right 05/11/2013   Bone spur of right foot 05/11/2013   Neuralgia of right foot 05/11/2013   Acute right lower quadrant pain 12/14/2012   Right lateral epicondylitis 05/11/2010   SHOULDER PAIN 10/16/2008   AODM 07/17/2008   GERD 07/17/2008    PCP: Kristian Covey, MD   REFERRING PROVIDER:   Pollyann Savoy, MD    REFERRING DIAG:  Diagnosis  M50.30 (ICD-10-CM) - DDD (degenerative disc disease), cervical    THERAPY DIAG:  Cervicalgia  Rationale for Evaluation and Treatment: Rehabilitation  ONSET DATE: Feb 2024  SUBJECTIVE:                                                                                                                                                                                                          SUBJECTIVE STATEMENT:  Pt notes the ROM is better but the pain is similar to rotation. He is able  to turn more. Driving has been easier.    Eval: Pt states the pain started just prior to Feb. Pt had gone to the chiro but has had no relief. Pt has difficulty with rotation, especially with driving. He finds that this is the most limiting and concerning due to his travel for work. Pt was testing and does not have RA. Pt has known OA into the neck following a recent x-ray. Pt is a back sleeper and turning his head is extremely uncomfortable when waking up after a night. Moving from a static position is very uncomfortable. Pt notes that thin pillows make it worse. Pt denies NT or UE weakness. Pt denies 5D's 3N's. Pt states the pain feels deep. Pain does not linger for more than 20-30s. Pt has tried light AROM but is unsure of how much to push.    Hand dominance: Right  PERTINENT HISTORY:  Bilat RC repairs, elbow arthroscopy, DM2, sleep apnea  PAIN:  Are you having pain? No: NPRS scale: 0/10; 6/10 at worst Pain location: central C/S Pain description: sharp pain with turning Aggravating factors: rotational motions, waking up in the morning, driving  Relieving factors: rest,   PRECAUTIONS: None  WEIGHT BEARING RESTRICTIONS: No  FALLS:  Has patient fallen in last 6 months? 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  Anterior osteophytes were  noted.   Facet joint arthropathy was noted.   Impression: These findings are consistent with cervical spondylosis and  facet joint arthropathy.  PATIENT SURVEYS:  FOTO 59 68 @ DC  TODAY'S TREATMENT:                                                                                                                              DATE:  5/16  Manual: C2-7 UPA grade IV; grade IV side glide; manual traction; manual traction with chin nod  Cervical SNAG rotation 10x each; extension and combined R SB 10x each  Chin tuck supine 10x  Chin tuck with rotation 10x each      5/8    Exercises - Seated Cervical Retraction  - 2-3 x daily - 7 x weekly - 1 sets - 10 reps - Seated Cervical Retraction and Rotation  - 2-3 x daily - 7 x weekly - 1 sets - 10 reps - Seated Levator Scapulae Stretch  - 2 x daily - 7 x weekly - 1 sets - 3 reps - 30 hold - Seated Assisted Cervical Rotation with Towel  - 2 x daily - 7 x weekly - 3 sets - 10 reps  PATIENT EDUCATION: Education details: anatomy, exercise progression, DOMS expectations,  acceptable levels of pain, thermotherapy, massage therapy,  envelope of function, HEP, POC  Person educated: Patient Education method: Explanation, Demonstration, Tactile cues, Verbal cues, and Handouts Education comprehension: verbalized understanding, returned demonstration, verbal cues required, tactile cues required, and needs further education   HOME EXERCISE PROGRAM:  Access Code: [email or web address] URL: [email or web address] Date: 08/25/2022 Prepared by: Zebedee Iba     ASSESSMENT:   CLINICAL IMPRESSION: Pt with signficant improvement in rotational ROM following manual therapy and exercise. Pt with abolishment of pain with movement following treatment session. Pt responds very well to manual, especially side gliding. HEP updated to work into side bending motion. Plan to continue with joint mobs and self cervical glides. Consider gravity resisted chin tuck at next. Pt would  benefit from continued skilled therapy in order to reach goals and maximize functional bilat shoulder strength and ROM for return to ADL and exercise for management of chronic health issues.   OBJECTIVE IMPAIRMENTS decreased strength, impaired UE functional use, improper body mechanics, postural dysfunction, pain, and hypomobility .    ACTIVITY LIMITATIONS community activity, occupation, dressing, shopping, bathing, self-care, and exercise/recreation .    PERSONAL FACTORS: 1-2comorbidities, and Time since onset of injury/illness/exacerbation are also affecting patient's functional outcome.     REHAB POTENTIAL: Good   CLINICAL DECISION MAKING: stable/uncomplicated   EVALUATION COMPLEXITY: Low   GOALS: SHORT TERM GOALS: Target Date 10/06/2022     Pt  will become independent with final HEP in order to demonstrate synthesis of PT education on pain management and progression of upper quarter function.      Goal status: INITIAL   2.  Pt will be able to demonstrate ability to perform AROM C/S without pain in order to demonstrate functional improvement in upper quarter for self-care and house hold duties.      Goal status: INITIAL   3.  Pt will be able to demonstrate at least 75% cervical rotation  in order to demonstrate functional improvement in upper quarter and cervical ROM for driving related tasks.      Goal status: INITIAL      LONG TERM GOALS: Target Date 11/17/2022     Pt  will become independent with final HEP in order to demonstrate synthesis of PT education on pain management and progression of upper quarter function.      Goal status: INITIAL   2.  Pt will be able to demonstrate/report ability to sit/stand/sleep for extended periods of time without pain in order to demonstrate functional improvement and tolerance to static positioning.     Goal status: INITIAL   3.  Pt will score >/= 68 on FOTO to demonstrate improvement in perceived neck function.      PLAN: PT  FREQUENCY: 1x/week   PT DURATION: 12weeks  (likely D/C in 6-8)    PLANNED INTERVENTIONS: Therapeutic exercises, Therapeutic activity, Neuromuscular re-education, Balance training, Gait training, Patient/Family education, Self Care, Joint mobilization, Joint manipulation, DME instructions, Aquatic Therapy, Dry Needling, Electrical stimulation, Spinal manipulation, Spinal mobilization, Cryotherapy, Moist heat, scar mobilization, Splintting, Taping, Vasopneumatic device, Traction, Ultrasound, Ionotophoresis 4mg /ml Dexamethasone, Manual therapy, and Re-evaluation   PLAN FOR NEXT SESSION: cervical mobs/manual traction;  cervical mobility as tolerated       Zebedee Iba, PT 09/02/2022, 9:31 AM

## 2022-09-03 ENCOUNTER — Other Ambulatory Visit: Payer: Self-pay | Admitting: Family Medicine

## 2022-09-09 ENCOUNTER — Encounter (HOSPITAL_BASED_OUTPATIENT_CLINIC_OR_DEPARTMENT_OTHER): Payer: Self-pay | Admitting: Physical Therapy

## 2022-09-09 ENCOUNTER — Ambulatory Visit (HOSPITAL_BASED_OUTPATIENT_CLINIC_OR_DEPARTMENT_OTHER): Payer: 59 | Admitting: Physical Therapy

## 2022-09-09 DIAGNOSIS — M542 Cervicalgia: Secondary | ICD-10-CM

## 2022-09-09 NOTE — Therapy (Signed)
OUTPATIENT PHYSICAL THERAPY CERVICAL TREATMENT   Patient Name: Spencer Duffy MRN: [id number] DOB:[birth year], 57 y.o., male Today's Date: 09/09/2022  END OF SESSION:  PT End of Session - 09/09/22 1453     Visit Number 3    Number of Visits 14    Date for PT Re-Evaluation 11/23/22    Authorization Type [employer]    PT Start Time 1402    PT Stop Time 1441    PT Time Calculation (min) 39 min    Activity Tolerance Patient tolerated treatment well    Behavior During Therapy WFL for tasks assessed/performed              Past Medical History:  Diagnosis Date   Arthritis    Diabetes mellitus    TYPE II   GERD (gastroesophageal reflux disease)    Hay fever    ALLERGIES   Heart murmur    Past Surgical History:  Procedure Laterality Date   BILATERAL KNEE ARTHROSCOPY     X2   ELBOW ARTHROSCOPY WITH TENDON RECONSTRUCTION Right 12/22/2016   Neuroplasty Digital Nerve Right 06/21/2013   @ [facility]   ROTATOR CUFF REPAIR Bilateral    SKIN CANCER EXCISION     ON CHEST   Tarsal Exostectomy Right 06/21/2013   @ [facility]   Patient Active Problem List   Diagnosis Date Noted   Insomnia 06/10/2020   Hallux rigidus of right foot 10/09/2019   Obstructive sleep apnea 08/31/2019   Avulsion of skin of elbow, right, initial encounter 08/05/2016   Status post foot surgery 07/03/2013   Type II diabetes mellitus, well controlled (HCC) 06/01/2013   Foot pain, right 05/11/2013   Bone spur of right foot 05/11/2013   Neuralgia of right foot 05/11/2013   Acute right lower quadrant pain 12/14/2012   Right lateral epicondylitis 05/11/2010   SHOULDER PAIN 10/16/2008   AODM 07/17/2008   GERD 07/17/2008    PCP: Kristian Covey, MD   REFERRING PROVIDER:   Pollyann Savoy, MD    REFERRING DIAG:  Diagnosis  M50.30 (ICD-10-CM) - DDD (degenerative disc disease), cervical    THERAPY DIAG:  Cervicalgia  Rationale for Evaluation and Treatment: Rehabilitation  ONSET DATE: Feb 2024  SUBJECTIVE:                                                                                                                                                                                                          SUBJECTIVE STATEMENT:  Pt stats that the neck is in a ton of pain today. He slept in  a different bed for a few days and sleeping on the couch caused increase in pain.    Eval: Pt states the pain started just prior to Feb. Pt had gone to the chiro but has had no relief. Pt has difficulty with rotation, especially with driving. He finds that this is the most limiting and concerning due to his travel for work. Pt was testing and does not have RA. Pt has known OA into the neck following a recent x-ray. Pt is a back sleeper and turning his head is extremely uncomfortable when waking up after a night. Moving from a static position is very uncomfortable. Pt notes that thin pillows make it worse. Pt denies NT or UE weakness. Pt denies 5D's 3N's. Pt states the pain feels deep. Pain does not linger for more than 20-30s. Pt has tried light AROM but is unsure of how much to push.    Hand dominance: Right  PERTINENT HISTORY:  Bilat RC repairs, elbow arthroscopy, DM2, sleep apnea  PAIN:  Are you having pain? Yes: NPRS scale: 5/10; 6/10 at worst Pain location: central C/S Pain description: sharp pain with turning Aggravating factors: rotational motions, waking up in the morning, driving  Relieving factors: rest,   PRECAUTIONS: None  WEIGHT BEARING RESTRICTIONS: No  FALLS:  Has patient fallen in last 6 months? 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   Anterior osteophytes were  noted.  Facet joint arthropathy was noted.   Impression: These findings are consistent with cervical spondylosis and  facet joint arthropathy.  PATIENT SURVEYS:  FOTO 59 68 @ DC  TODAY'S TREATMENT:                                                                                                                              DATE:  5/16  Manual: C2-7 UPA grade IV; grade IV side glide; R and L inf medial closing glide grade IV; manual traction; manual traction with chin nod  5/16  Manual: C2-7 UPA grade IV; grade IV side glide; manual traction; manual traction with chin nod  Cervical SNAG rotation 10x each; extension and combined R SB 10x each  Chin tuck supine 10x  Chin tuck with rotation 10x each      5/8    Exercises - Seated Cervical Retraction  - 2-3 x daily - 7 x weekly - 1 sets - 10 reps - Seated Cervical Retraction and Rotation  - 2-3 x daily - 7 x weekly - 1 sets - 10 reps - Seated Levator Scapulae Stretch  - 2 x daily - 7 x weekly - 1 sets -  3 reps - 30 hold - Seated Assisted Cervical Rotation with Towel  - 2 x daily - 7 x weekly - 3 sets - 10 reps  PATIENT EDUCATION: Education details: anatomy, exercise progression, DOMS expectations, acceptable levels of pain, thermotherapy, massage therapy,  envelope of function, HEP, POC  Person educated: Patient Education method: Explanation, Demonstration, Tactile cues, Verbal cues, and Handouts Education comprehension: verbalized understanding, returned demonstration, verbal cues required, tactile cues required, and needs further education   HOME EXERCISE PROGRAM:  Access Code: [email or web address] URL: [email or web address] Date: 08/25/2022 Prepared by: Zebedee Iba     ASSESSMENT:   CLINICAL IMPRESSION: Pt presents with flare of pain from recent travel and irritating sleeping position/conditions. Pt was able to increase rotational ROM by 20 degrees bilaterally as well as had abolishment of  pain by end of session. No new HEP introduced given pain increase. Plan to continue with joint mobs and self cervical glides. Consider gravity resisted chin tuck as tolerated. Pt would benefit from continued skilled therapy in order to reach goals and maximize functional bilat shoulder strength and ROM for return to ADL and exercise for management of chronic health issues.   OBJECTIVE IMPAIRMENTS decreased strength, impaired UE functional use, improper body mechanics, postural dysfunction, pain, and hypomobility .    ACTIVITY LIMITATIONS community activity, occupation, dressing, shopping, bathing, self-care, and exercise/recreation .    PERSONAL FACTORS: 1-2comorbidities, and Time since onset of injury/illness/exacerbation are also affecting patient's functional outcome.     REHAB POTENTIAL: Good   CLINICAL DECISION MAKING: stable/uncomplicated   EVALUATION COMPLEXITY: Low   GOALS: SHORT TERM GOALS: Target Date 10/06/2022     Pt  will become independent with final HEP in order to demonstrate synthesis of PT education on pain management and progression of upper quarter function.      Goal status: INITIAL   2.  Pt will be able to demonstrate ability to perform AROM C/S without pain in order to demonstrate functional improvement in upper quarter for self-care and house hold duties.      Goal status: INITIAL   3.  Pt will be able to demonstrate at least 75% cervical rotation  in order to demonstrate functional improvement in upper quarter and cervical ROM for driving related tasks.      Goal status: INITIAL      LONG TERM GOALS: Target Date 11/17/2022     Pt  will become independent with final HEP in order to demonstrate synthesis of PT education on pain management and progression of upper quarter function.      Goal status: INITIAL   2.  Pt will be able to demonstrate/report ability to sit/stand/sleep for extended periods of time without pain in order to demonstrate functional  improvement and tolerance to static positioning.     Goal status: INITIAL   3.  Pt will score >/= 68 on FOTO to demonstrate improvement in perceived neck function.      PLAN: PT FREQUENCY: 1x/week   PT DURATION: 12weeks  (likely D/C in 6-8)    PLANNED INTERVENTIONS: Therapeutic exercises, Therapeutic activity, Neuromuscular re-education, Balance training, Gait training, Patient/Family education, Self Care, Joint mobilization, Joint manipulation, DME instructions, Aquatic Therapy, Dry Needling, Electrical stimulation, Spinal manipulation, Spinal mobilization, Cryotherapy, Moist heat, scar mobilization, Splintting, Taping, Vasopneumatic device, Traction, Ultrasound, Ionotophoresis 4mg /ml Dexamethasone, Manual therapy, and Re-evaluation   PLAN FOR NEXT SESSION: cervical mobs/manual traction;  cervical mobility as tolerated       Zebedee Iba, PT 09/09/2022, 3:02 PM

## 2022-09-09 NOTE — Progress Notes (Signed)
Office Visit Note  Patient: Spencer Duffy             Date of Birth: March 21, 1966           MRN: [id number]             PCP: Kristian Covey, MD Referring: Kristian Covey, MD Visit Date: 09/23/2022 Occupation: @GUAROCC @  Subjective:  Pain and swelling in both knees   History of Present Illness: Spencer Duffy is a 57 y.o. male with history of osteoarthritis.  Patient states that he has been going to physical therapy on a regular basis for neck discomfort.  He has noticed improvement in the neck pain.  He has some stiffness and discomfort in his hands.  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  He has intermittent pain and discomfort in his knee joints and also intermittent swelling.  The right elbow tendinitis has resolved.  He has some discomfort in his feet.  Has been taking Tylenol for discomfort.    Activities of Daily Living:  Patient reports morning stiffness for 5-10 minutes.   Patient Reports nocturnal pain.  Difficulty dressing/grooming: Denies Difficulty climbing stairs: Denies Difficulty getting out of chair: Denies Difficulty using hands for taps, buttons, cutlery, and/or writing: Denies  Review of Systems  Constitutional:  Negative for fatigue.  HENT:  Positive for mouth dryness. Negative for mouth sores.   Eyes:  Negative for dryness.  Respiratory:  Negative for shortness of breath.   Cardiovascular:  Negative for chest pain and palpitations.  Gastrointestinal:  Negative for blood in stool, constipation and diarrhea.  Endocrine: Negative for increased urination.  Genitourinary:  Negative for involuntary urination.  Musculoskeletal:  Positive for joint pain, joint pain, joint swelling and morning stiffness. Negative for gait problem, myalgias, muscle weakness, muscle tenderness and myalgias.  Skin:  Negative for color change, rash and sensitivity to sunlight.  Allergic/Immunologic: Negative for susceptible to infections.  Neurological:  Negative for  dizziness and headaches.  Hematological:  Negative for swollen glands.  Psychiatric/Behavioral:  Positive for sleep disturbance. Negative for depressed mood. The patient is not nervous/anxious.     PMFS History:  Patient Active Problem List   Diagnosis Date Noted   Insomnia 06/10/2020   Hallux rigidus of right foot 10/09/2019   Obstructive sleep apnea 08/31/2019   Avulsion of skin of elbow, right, initial encounter 08/05/2016   Status post foot surgery 07/03/2013   Type II diabetes mellitus, well controlled (HCC) 06/01/2013   Foot pain, right 05/11/2013   Bone spur of right foot 05/11/2013   Neuralgia of right foot 05/11/2013   Acute right lower quadrant pain 12/14/2012   Right lateral epicondylitis 05/11/2010   SHOULDER PAIN 10/16/2008   AODM 07/17/2008   GERD 07/17/2008    Past Medical History:  Diagnosis Date   Arthritis    Diabetes mellitus    TYPE II   GERD (gastroesophageal reflux disease)    Hay fever    ALLERGIES   Heart murmur     Family History  Problem Relation Age of Onset   Arthritis Other    Hypertension Other    Diabetes Other        PARENT, GRANDPARENT   Lung cancer Father    Heart attack Father    Colon cancer Paternal Grandmother        50's   Diabetes Paternal Grandmother    Diabetes Sister    Head & neck cancer Brother  Diabetes Brother    Diabetes Maternal Grandmother    Stroke Paternal Grandfather    Past Surgical History:  Procedure Laterality Date   BILATERAL KNEE ARTHROSCOPY     X2   ELBOW ARTHROSCOPY WITH TENDON RECONSTRUCTION Right 12/22/2016   Neuroplasty Digital Nerve Right 06/21/2013   @ [facility]   ROTATOR CUFF REPAIR Bilateral    SKIN CANCER EXCISION     ON CHEST   Tarsal Exostectomy Right 06/21/2013   @ [facility]   Social History   Social History Narrative   Not on file   Immunization History  Administered Date(s) Administered   Hepatitis A 06/17/2000, 01/17/2001   Hepatitis B 03/05/2009, 04/07/2009, 09/24/2009   Influenza Inj  Mdck Quad Pf 02/08/2019   Influenza Split 01/28/2012   Influenza Whole 02/07/2009   Influenza,inj,Quad PF,6+ Mos 02/08/2017, 04/07/2018, 02/07/2022   Influenza,inj,Quad PF,6-35 Mos 03/26/2021   Influenza-Unspecified 01/18/2016, 02/11/2020   PFIZER(Purple Top)SARS-COV-2 Vaccination 06/28/2019, 07/23/2019, 02/19/2020   Pneumococcal Conjugate-13 08/10/2012   Td 02/17/2009   Tdap 02/17/2009, 12/04/2021   Typhoid Live 05/22/2018   Zoster Recombinat (Shingrix) 05/19/2018, 09/29/2018     Objective: Vital Signs: BP 121/76 (BP Location: Left Arm, Patient Position: Sitting, Cuff Size: Normal)   Pulse 64   Resp 17   Ht 5\' 11"  (1.803 m)   Wt 198 lb (89.8 kg)   BMI 27.62 kg/m    Physical Exam Vitals and nursing note reviewed.  Constitutional:      Appearance: He is well-developed.  HENT:     Head: Normocephalic and atraumatic.  Eyes:     Conjunctiva/sclera: Conjunctivae normal.     Pupils: Pupils are equal, round, and reactive to light.  Cardiovascular:     Rate and Rhythm: Normal rate and regular rhythm.     Heart sounds: Normal heart sounds.  Pulmonary:     Effort: Pulmonary effort is normal.     Breath sounds: Normal breath sounds.  Abdominal:     General: Bowel sounds are normal.     Palpations: Abdomen is soft.  Musculoskeletal:     Cervical back: Normal range of motion and neck supple.  Skin:    General: Skin is warm and dry.     Capillary Refill: Capillary refill takes less than 2 seconds.  Neurological:     Mental Status: He is alert and oriented to person, place, and time.  Psychiatric:        Behavior: Behavior normal.      Musculoskeletal Exam: He had limited lateral rotation of the cervical spine.  Thoracic and lumbar spine were in good range of motion.  Shoulder joints, elbow joints, wrist joints with good range of motion.  He had bilateral PIP and DIP thickening with no synovitis.  Hip joints and knee joints with good range of motion.  No warmth swelling or  effusion was noted over the knee joints.  There was no tenderness over ankles or MTPs.  CDAI Exam: CDAI Score: -- Patient Global: --; Provider Global: -- Swollen: --; Tender: -- Joint Exam 09/23/2022   No joint exam has been documented for this visit   There is currently no information documented on the homunculus. Go to the Rheumatology activity and complete the homunculus joint exam.  Investigation: No additional findings.  Imaging: No results found.  Recent Labs: Lab Results  Component Value Date   WBC 5.5 12/04/2021   HGB 13.4 12/04/2021   PLT 250.0 12/04/2021   NA 137 12/04/2021   K 4.7 12/04/2021  CL 102 12/04/2021   CO2 29 12/04/2021   GLUCOSE 130 (H) 12/04/2021   BUN 18 12/04/2021   CREATININE 0.93 12/04/2021   BILITOT 0.6 12/04/2021   ALKPHOS 75 12/04/2021   AST 16 12/04/2021   ALT 20 12/04/2021   PROT 6.4 12/04/2021   ALBUMIN 4.5 12/04/2021   CALCIUM 9.1 12/04/2021    Speciality Comments: No specialty comments available.  Procedures:  No procedures performed Allergies: Aleve [naproxen]   Assessment / Plan:     Visit Diagnoses: Primary osteoarthritis of both hands - Labs obtained on May 21, 2022 RF negative, anti-CCP negative, CRP and sed rate were normal.  Uric acid was normal.  He continues to have pain and discomfort in his hands.  Bilateral PIP and DIP thickening was noted.  He does yard work and works out.  He tried protection muscle strengthening was discussed.  No warmth or synovitis was noted.  A handout on hand exercises was given.  Joint protection was discussed.  S/P trigger finger release -doing well.  Status post bilateral trigger thumb release by Dr. Dion Saucier.  S/P carpal tunnel release-left -doing well.  April 2023 by Dr. Mina Marble  Primary osteoarthritis of both knees -he continues to have pain and discomfort in his bilateral knee joints.  He also gives history of intermittent swelling.  No warmth swelling or effusion was noted.  X-rays  obtained in the past showed bilateral moderate osteoarthritis and chondromalacia patella was noted.  I discussed the option of Visco supplement injections which he declined.  He would not be a good candidate for cortisone injection as he is diabetic.  Patient stated that he will reach out to [place] when he is ready for viscosupplement injections.  A handout on lower extremity exercises was given.  I also discussed the option of Cymbalta.  Indications side effects were discussed.  Patient declined Cymbalta.  Primary osteoarthritis of both feet - He is followed by Dr. Ardelle Anton.  He had surgical fusion of the right first MTP.  X-rays in the past showed osteoarthritis.  Proper fitting shoes were advised.  S/P rotator cuff repair - History of right clavicle fracture  Right lateral epicondylitis - Resolved.  Hallux rigidus of both feet - Status post bilateral first MTP arthrodesis right August 08, 2019 left April 14, 2022  DDD (degenerative disc disease), cervical-he continues to have limited range of motion of the cervical spine.  He is going to physical therapy and noticing improvement.  Primary insomnia  Type II diabetes mellitus, well controlled (HCC)  Obstructive sleep apnea  Family history of rheumatoid arthritis-paternal first cousin  Orders: No orders of the defined types were placed in this encounter.  No orders of the defined types were placed in this encounter.    Follow-Up Instructions: Return if symptoms worsen or fail to improve, for Osteoarthritis.   Pollyann Savoy, MD  Note - This record has been created using Animal nutritionist.  Chart creation errors have been sought, but may not always  have been located. Such creation errors do not reflect on  the standard of medical care.

## 2022-09-16 ENCOUNTER — Ambulatory Visit (HOSPITAL_BASED_OUTPATIENT_CLINIC_OR_DEPARTMENT_OTHER): Payer: 59

## 2022-09-16 ENCOUNTER — Encounter (HOSPITAL_BASED_OUTPATIENT_CLINIC_OR_DEPARTMENT_OTHER): Payer: Self-pay

## 2022-09-16 DIAGNOSIS — M542 Cervicalgia: Secondary | ICD-10-CM

## 2022-09-16 NOTE — Therapy (Signed)
OUTPATIENT PHYSICAL THERAPY CERVICAL TREATMENT   Patient Name: Spencer Duffy MRN: [id number] DOB:[birth year], 57 y.o., male Today's Date: 09/16/2022  END OF SESSION:  PT End of Session - 09/16/22 1547     Visit Number 4    Number of Visits 14    Date for PT Re-Evaluation 11/23/22    Authorization Type [employer]    PT Start Time 1517    PT Stop Time 1555    PT Time Calculation (min) 38 min    Activity Tolerance Patient tolerated treatment well    Behavior During Therapy WFL for tasks assessed/performed               Past Medical History:  Diagnosis Date   Arthritis    Diabetes mellitus    TYPE II   GERD (gastroesophageal reflux disease)    Hay fever    ALLERGIES   Heart murmur    Past Surgical History:  Procedure Laterality Date   BILATERAL KNEE ARTHROSCOPY     X2   ELBOW ARTHROSCOPY WITH TENDON RECONSTRUCTION Right 12/22/2016   Neuroplasty Digital Nerve Right 06/21/2013   @ [facility]   ROTATOR CUFF REPAIR Bilateral    SKIN CANCER EXCISION     ON CHEST   Tarsal Exostectomy Right 06/21/2013   @ [facility]   Patient Active Problem List   Diagnosis Date Noted   Insomnia 06/10/2020   Hallux rigidus of right foot 10/09/2019   Obstructive sleep apnea 08/31/2019   Avulsion of skin of elbow, right, initial encounter 08/05/2016   Status post foot surgery 07/03/2013   Type II diabetes mellitus, well controlled (HCC) 06/01/2013   Foot pain, right 05/11/2013   Bone spur of right foot 05/11/2013   Neuralgia of right foot 05/11/2013   Acute right lower quadrant pain 12/14/2012   Right lateral epicondylitis 05/11/2010   SHOULDER PAIN 10/16/2008   AODM 07/17/2008   GERD 07/17/2008    PCP: Kristian Covey, MD   REFERRING PROVIDER:   Pollyann Savoy, MD    REFERRING DIAG:  Diagnosis  M50.30 (ICD-10-CM) - DDD (degenerative disc disease), cervical    THERAPY DIAG:  Cervicalgia  Rationale for Evaluation and Treatment: Rehabilitation  ONSET DATE: Feb 2024  SUBJECTIVE:                                                                                                                                                                                                          SUBJECTIVE STATEMENT:  Pt reports improved pain level and denies pain at rest. "This is the best  it's been since I started coming to PT." Improved sleeping at night.    Eval: Pt states the pain started just prior to Feb. Pt had gone to the chiro but has had no relief. Pt has difficulty with rotation, especially with driving. He finds that this is the most limiting and concerning due to his travel for work. Pt was testing and does not have RA. Pt has known OA into the neck following a recent x-ray. Pt is a back sleeper and turning his head is extremely uncomfortable when waking up after a night. Moving from a static position is very uncomfortable. Pt notes that thin pillows make it worse. Pt denies NT or UE weakness. Pt denies 5D's 3N's. Pt states the pain feels deep. Pain does not linger for more than 20-30s. Pt has tried light AROM but is unsure of how much to push.    Hand dominance: Right  PERTINENT HISTORY:  Bilat RC repairs, elbow arthroscopy, DM2, sleep apnea  PAIN:  Are you having pain? Yes: NPRS scale: 5/10; 6/10 at worst Pain location: central C/S Pain description: sharp pain with turning Aggravating factors: rotational motions, waking up in the morning, driving  Relieving factors: rest,   PRECAUTIONS: None  WEIGHT BEARING RESTRICTIONS: No  FALLS:  Has patient fallen in last 6 months? 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   Anterior osteophytes were  noted.  Facet joint arthropathy was noted.   Impression: These findings are consistent with cervical spondylosis and  facet joint arthropathy.  PATIENT SURVEYS:  FOTO [id number] @ [facility]  TODAY'S TREATMENT:                                                                                                                              DATE:  5/30  Manual traction Cervical PROM/manual stretching STM cervical paraspinals/SO release  Supine chin tuck 5" x10 TB row/ext GTB 2x15 ea Bilateral ER RTB 2x10 Horizontal abduction 2x10  5/16  Manual: C2-7 UPA grade IV; grade IV side glide; manual traction; manual traction with chin nod  Cervical SNAG rotation 10x each; extension and combined R SB 10x each  Chin tuck supine 10x  Chin tuck with rotation 10x each      5/8    Exercises - Seated Cervical Retraction  - 2-3 x daily - 7 x weekly - 1 sets - 10 reps - Seated Cervical Retraction and Rotation  - 2-3 x daily - 7 x weekly - 1 sets - 10 reps - Seated Levator Scapulae Stretch  - 2 x daily - 7 x weekly - 1 sets - 3 reps -  30 hold - Seated Assisted Cervical Rotation with Towel  - 2 x daily - 7 x weekly - 3 sets - 10 reps  PATIENT EDUCATION: Education details: anatomy, exercise progression, DOMS expectations, acceptable levels of pain, thermotherapy, massage therapy,  envelope of function, HEP, POC  Person educated: Patient Education method: Explanation, Demonstration, Tactile cues, Verbal cues, and Handouts Education comprehension: verbalized understanding, returned demonstration, verbal cues required, tactile cues required, and needs further education   HOME EXERCISE PROGRAM:  Access Code: [email or web address] URL: [email or web address] Date: 08/25/2022 Prepared by: Zebedee Iba     ASSESSMENT:   CLINICAL IMPRESSION: Pt had good tolerance for manual interventions.  He denied tenderness in upper traps bilaterally.  Patient was most tender in right SCM.  Mild  tenderness in left SCM.  Tightness with endrange cervical rotation bilaterally.  Patient denied pain with postural re-ed exercises.  Will continue to progress as tolerated.   OBJECTIVE IMPAIRMENTS decreased strength, impaired UE functional use, improper body mechanics, postural dysfunction, pain, and hypomobility .    ACTIVITY LIMITATIONS community activity, occupation, dressing, shopping, bathing, self-care, and exercise/recreation .    PERSONAL FACTORS: 1-2comorbidities, and Time since onset of injury/illness/exacerbation are also affecting patient's functional outcome.     REHAB POTENTIAL: Good   CLINICAL DECISION MAKING: stable/uncomplicated   EVALUATION COMPLEXITY: Low   GOALS: SHORT TERM GOALS: Target Date 10/06/2022     Pt  will become independent with final HEP in order to demonstrate synthesis of PT education on pain management and progression of upper quarter function.      Goal status: INITIAL   2.  Pt will be able to demonstrate ability to perform AROM C/S without pain in order to demonstrate functional improvement in upper quarter for self-care and house hold duties.      Goal status: INITIAL   3.  Pt will be able to demonstrate at least 75% cervical rotation  in order to demonstrate functional improvement in upper quarter and cervical ROM for driving related tasks.      Goal status: INITIAL      LONG TERM GOALS: Target Date 11/17/2022     Pt  will become independent with final HEP in order to demonstrate synthesis of PT education on pain management and progression of upper quarter function.      Goal status: INITIAL   2.  Pt will be able to demonstrate/report ability to sit/stand/sleep for extended periods of time without pain in order to demonstrate functional improvement and tolerance to static positioning.     Goal status: INITIAL   3.  Pt will score >/= 68 on FOTO to demonstrate improvement in perceived neck function.      PLAN: PT FREQUENCY:  1x/week   PT DURATION: 12weeks  (likely D/C in 6-8)    PLANNED INTERVENTIONS: Therapeutic exercises, Therapeutic activity, Neuromuscular re-education, Balance training, Gait training, Patient/Family education, Self Care, Joint mobilization, Joint manipulation, DME instructions, Aquatic Therapy, Dry Needling, Electrical stimulation, Spinal manipulation, Spinal mobilization, Cryotherapy, Moist heat, scar mobilization, Splintting, Taping, Vasopneumatic device, Traction, Ultrasound, Ionotophoresis 4mg /ml Dexamethasone, Manual therapy, and Re-evaluation   PLAN FOR NEXT SESSION: cervical mobs/manual traction;  cervical mobility as tolerated       Donnel Saxon Tanice Petre, PTA 09/16/2022, 4:57 PM

## 2022-09-23 ENCOUNTER — Ambulatory Visit: Payer: 59 | Attending: Rheumatology | Admitting: Rheumatology

## 2022-09-23 ENCOUNTER — Ambulatory Visit (HOSPITAL_BASED_OUTPATIENT_CLINIC_OR_DEPARTMENT_OTHER): Payer: 59 | Admitting: Physical Therapy

## 2022-09-23 ENCOUNTER — Encounter: Payer: Self-pay | Admitting: Rheumatology

## 2022-09-23 ENCOUNTER — Encounter (HOSPITAL_BASED_OUTPATIENT_CLINIC_OR_DEPARTMENT_OTHER): Payer: Self-pay | Admitting: Physical Therapy

## 2022-09-23 VITALS — BP 121/76 | HR 64 | Resp 17 | Ht 71.0 in | Wt 198.0 lb

## 2022-09-23 DIAGNOSIS — M5030 Other cervical disc degeneration, unspecified cervical region: Secondary | ICD-10-CM | POA: Diagnosis present

## 2022-09-23 DIAGNOSIS — M2021 Hallux rigidus, right foot: Secondary | ICD-10-CM

## 2022-09-23 DIAGNOSIS — E119 Type 2 diabetes mellitus without complications: Secondary | ICD-10-CM | POA: Diagnosis present

## 2022-09-23 DIAGNOSIS — M542 Cervicalgia: Secondary | ICD-10-CM | POA: Insufficient documentation

## 2022-09-23 DIAGNOSIS — G4733 Obstructive sleep apnea (adult) (pediatric): Secondary | ICD-10-CM | POA: Diagnosis present

## 2022-09-23 DIAGNOSIS — M19042 Primary osteoarthritis, left hand: Secondary | ICD-10-CM

## 2022-09-23 DIAGNOSIS — M7711 Lateral epicondylitis, right elbow: Secondary | ICD-10-CM

## 2022-09-23 DIAGNOSIS — M2022 Hallux rigidus, left foot: Secondary | ICD-10-CM | POA: Diagnosis present

## 2022-09-23 DIAGNOSIS — Z9889 Other specified postprocedural states: Secondary | ICD-10-CM | POA: Diagnosis present

## 2022-09-23 DIAGNOSIS — M170 Bilateral primary osteoarthritis of knee: Secondary | ICD-10-CM | POA: Diagnosis present

## 2022-09-23 DIAGNOSIS — M19071 Primary osteoarthritis, right ankle and foot: Secondary | ICD-10-CM

## 2022-09-23 DIAGNOSIS — M19072 Primary osteoarthritis, left ankle and foot: Secondary | ICD-10-CM

## 2022-09-23 DIAGNOSIS — Z8261 Family history of arthritis: Secondary | ICD-10-CM | POA: Diagnosis present

## 2022-09-23 DIAGNOSIS — F5101 Primary insomnia: Secondary | ICD-10-CM

## 2022-09-23 DIAGNOSIS — M19041 Primary osteoarthritis, right hand: Secondary | ICD-10-CM

## 2022-09-23 NOTE — Patient Instructions (Signed)
Exercises for Chronic Knee Pain Chronic knee pain is pain that lasts longer than 3 months. For most people with chronic knee pain, exercise and weight loss is an important part of treatment. Your health care provider may want you to focus on: Making the muscles that support your knee stronger. This can take pressure off your knee and reduce pain. Preventing knee stiffness. How far you can move your knee, keeping it there or making it farther. Losing weight (if this applies) to take pressure off your knee, lower your risk for injury, and make it easier for you to exercise. Your provider will help you make an exercise program that fits your needs and physical abilities. Below are simple, low-impact exercises you can do at home. Ask your provider or physical therapist how often you should do your exercise program and how many times to repeat each exercise. General safety tips  Get your provider's approval before doing any exercises. Start slowly and stop any time you feel pain. Do not exercise if your knee pain is flaring up. Warm up first. Stretching a cold muscle can cause an injury. Do 5-10 minutes of easy movement or light stretching before beginning your exercises. Do 5-10 minutes of low-impact activity (like walking or cycling) before starting strengthening exercises. Contact your provider any time you have pain during or after exercising. Exercise can cause discomfort but should not be painful. It is normal to be a little stiff or sore after exercising. Stretching and range-of-motion exercises Front thigh stretch  Stand up straight and support your body by holding on to a chair or resting one hand on a wall. With your legs straight and close together, bend one knee to lift your heel up toward your butt. Using one hand for support, grab your ankle with your free hand. Pull your foot up closer toward your butt to feel the stretch in front of your thigh. Hold the stretch for 30  seconds. Repeat __________ times. Complete this exercise __________ times a day. Back thigh stretch  Sit on the floor with your back straight and your legs out straight in front of you. Place the palms of your hands on the floor and slide them toward your feet as you bend at the hip. Try to touch your nose to your knees and feel the stretch in the back of your thighs. Hold for 30 seconds. Repeat __________ times. Complete this exercise __________ times a day. Calf stretch  Stand facing a wall. Place the palms of your hands flat against the wall, arms extended, and lean slightly against the wall. Get into a lunge position with one leg bent at the knee and the other leg stretched out straight behind you. Keep both feet facing the wall and increase the bend in your knee while keeping the heel of the other leg flat on the ground. You should feel the stretch in your calf. Hold for 30 seconds. Repeat __________ times. Complete this exercise __________ times a day. Strengthening exercises Straight leg lift  Lie on your back with one knee bent and the other leg out straight. Slowly lift the straight leg without bending the knee. Lift until your foot is about 12 inches (30 cm) off the floor. Hold for 3-5 seconds and slowly lower your leg. Repeat __________ times. Complete this exercise __________ times a day. Single leg dip  Stand between two chairs and put both hands on the backs of the chairs for support. Extend one leg out straight with your body   weight resting on the heel of the standing leg. Slowly bend your standing knee to dip your body to the level that is comfortable for you. Hold for 3-5 seconds. Repeat __________ times. Complete this exercise __________ times a day. Hamstring curls  Stand straight, knees close together, facing the back of a chair. Hold on to the back of a chair with both hands. Keep one leg straight. Bend the other knee while bringing the heel up toward the butt  until the knee is bent at a 90-degree angle (right angle). Hold for 3-5 seconds. Repeat __________ times. Complete this exercise __________ times a day. Wall squat  Stand straight with your back, hips, and head against a wall. Step forward one foot at a time with your back still against the wall. Your feet should be 2 feet (61 cm) from the wall at shoulder width. Keeping your back, hips, and head against the wall, slide down the wall to as close to a sitting position as you can get. Hold for 5-10 seconds, then slowly slide back up. Repeat __________ times. Complete this exercise __________ times a day. Step-ups  Stand in front of a sturdy platform or stool that is about 6 inches (15 cm) high. Slowly step up with your left / right foot, keeping your knee in line with your hip and foot. Do not let your knee bend so far that you cannot see your toes. Hold on to a chair for balance, but do not use it for support. Slowly unlock your knee and lower yourself to the starting position. Repeat __________ times. Complete this exercise __________ times a day. Contact a health care provider if: Your exercises cause pain. Your pain is worse after you exercise. Your pain prevents you from doing your exercises. This information is not intended to replace advice given to you by your health care provider. Make sure you discuss any questions you have with your health care provider. Document Revised: 04/20/2022 Document Reviewed: 04/20/2022 Elsevier Patient Education  2024 [employer]  Hand Exercises Hand exercises can be helpful for almost anyone. They can strengthen your hands and improve flexibility and movement. The exercises can also increase blood flow to the hands. These results can make your work and daily tasks easier for you. Hand exercises can be especially helpful for people who have joint pain from arthritis or nerve damage from using their hands over and over. These exercises can also help  people who injure a hand. Exercises Most of these hand exercises are gentle stretching and motion exercises. It is usually safe to do them often throughout the day. Warming up your hands before exercise may help reduce stiffness. You can do this with gentle massage or by placing your hands in warm water for 10-15 minutes. It is normal to feel some stretching, pulling, tightness, or mild discomfort when you begin new exercises. In time, this will improve. Remember to always be careful and stop right away if you feel sudden, very bad pain or your pain gets worse. You want to get better and be safe. Ask your health care provider which exercises are safe for you. Do exercises exactly as told by your provider and adjust them as told. Do not begin these exercises until told by your provider. Knuckle bend or "claw" fist  Stand or sit with your arm, hand, and all five fingers pointed straight up. Make sure to keep your wrist straight. Gently bend your fingers down toward your palm until the tips of your   fingers are touching your palm. Keep your big knuckle straight and only bend the small knuckles in your fingers. Hold this position for 10 seconds. Straighten your fingers back to your starting position. Repeat this exercise 5-10 times with each hand. Full finger fist  Stand or sit with your arm, hand, and all five fingers pointed straight up. Make sure to keep your wrist straight. Gently bend your fingers into your palm until the tips of your fingers are touching the middle of your palm. Hold this position for 10 seconds. Extend your fingers back to your starting position, stretching every joint fully. Repeat this exercise 5-10 times with each hand. Straight fist  Stand or sit with your arm, hand, and all five fingers pointed straight up. Make sure to keep your wrist straight. Gently bend your fingers at the big knuckle, where your fingers meet your hand, and at the middle knuckle. Keep the knuckle at  the tips of your fingers straight and try to touch the bottom of your palm. Hold this position for 10 seconds. Extend your fingers back to your starting position, stretching every joint fully. Repeat this exercise 5-10 times with each hand. Tabletop  Stand or sit with your arm, hand, and all five fingers pointed straight up. Make sure to keep your wrist straight. Gently bend your fingers at the big knuckle, where your fingers meet your hand, as far down as you can. Keep the small knuckles in your fingers straight. Think of forming a tabletop with your fingers. Hold this position for 10 seconds. Extend your fingers back to your starting position, stretching every joint fully. Repeat this exercise 5-10 times with each hand. Finger spread  Place your hand flat on a table with your palm facing down. Make sure your wrist stays straight. Spread your fingers and thumb apart from each other as far as you can until you feel a gentle stretch. Hold this position for 10 seconds. Bring your fingers and thumb tight together again. Hold this position for 10 seconds. Repeat this exercise 5-10 times with each hand. Making circles  Stand or sit with your arm, hand, and all five fingers pointed straight up. Make sure to keep your wrist straight. Make a circle by touching the tip of your thumb to the tip of your index finger. Hold for 10 seconds. Then open your hand wide. Repeat this motion with your thumb and each of your fingers. Repeat this exercise 5-10 times with each hand. Thumb motion  Sit with your forearm resting on a table and your wrist straight. Your thumb should be facing up toward the ceiling. Keep your fingers relaxed as you move your thumb. Lift your thumb up as high as you can toward the ceiling. Hold for 10 seconds. Bend your thumb across your palm as far as you can, reaching the tip of your thumb for the small finger (pinkie) side of your palm. Hold for 10 seconds. Repeat this exercise  5-10 times with each hand. Grip strengthening  Hold a stress ball or other soft ball in the middle of your hand. Slowly increase the pressure, squeezing the ball as much as you can without causing pain. Think of bringing the tips of your fingers into the middle of your palm. All of your finger joints should bend when doing this exercise. Hold your squeeze for 10 seconds, then relax. Repeat this exercise 5-10 times with each hand. Contact a health care provider if: Your hand pain or discomfort gets much worse when   you do an exercise. Your hand pain or discomfort does not improve within 2 hours after you exercise. If you have either of these problems, stop doing these exercises right away. Do not do them again unless your provider says that you can. Get help right away if: You develop sudden, severe hand pain or swelling. If this happens, stop doing these exercises right away. Do not do them again unless your provider says that you can. This information is not intended to replace advice given to you by your health care provider. Make sure you discuss any questions you have with your health care provider. Document Revised: 04/20/2022 Document Reviewed: 04/20/2022 Elsevier Patient Education  2024 [employer]  

## 2022-09-23 NOTE — Therapy (Signed)
OUTPATIENT PHYSICAL THERAPY CERVICAL TREATMENT   Patient Name: Spencer Duffy MRN: [id number] DOB:Jan 02, 1966, 57 y.o., male Today's Date: 09/23/2022  END OF SESSION:  PT End of Session - 09/23/22 1617     Visit Number 5    Number of Visits 14    Date for PT Re-Evaluation 11/23/22    Authorization Type [employer]    PT Start Time 1615    PT Stop Time 1645    PT Time Calculation (min) 30 min    Activity Tolerance Patient tolerated treatment well    Behavior During Therapy WFL for tasks assessed/performed                Past Medical History:  Diagnosis Date   Arthritis    Diabetes mellitus    TYPE II   GERD (gastroesophageal reflux disease)    Hay fever    ALLERGIES   Heart murmur    Past Surgical History:  Procedure Laterality Date   BILATERAL KNEE ARTHROSCOPY     X2   ELBOW ARTHROSCOPY WITH TENDON RECONSTRUCTION Right 12/22/2016   Neuroplasty Digital Nerve Right 06/21/2013   @ [facility]   ROTATOR CUFF REPAIR Bilateral    SKIN CANCER EXCISION     ON CHEST   Tarsal Exostectomy Right 06/21/2013   @ [facility]   Patient Active Problem List   Diagnosis Date Noted   Insomnia 06/10/2020   Hallux rigidus of right foot 10/09/2019   Obstructive sleep apnea 08/31/2019   Avulsion of skin of elbow, right, initial encounter 08/05/2016   Status post foot surgery 07/03/2013   Type II diabetes mellitus, well controlled (HCC) 06/01/2013   Foot pain, right 05/11/2013   Bone spur of right foot 05/11/2013   Neuralgia of right foot 05/11/2013   Acute right lower quadrant pain 12/14/2012   Right lateral epicondylitis 05/11/2010   SHOULDER PAIN 10/16/2008   AODM 07/17/2008   GERD 07/17/2008    PCP: Kristian Covey, MD   REFERRING PROVIDER:   Pollyann Savoy, MD    REFERRING DIAG:  Diagnosis  M50.30 (ICD-10-CM) - DDD (degenerative disc disease), cervical    THERAPY DIAG:  Cervicalgia  Rationale for Evaluation and Treatment: Rehabilitation  ONSET DATE: Feb 2024  SUBJECTIVE:                                                                                                                                                                                                          SUBJECTIVE STATEMENT:  Pt has more motion than before but it is still limiting with turning  his head. Pain only happens at end range.    Eval: Pt states the pain started just prior to Feb. Pt had gone to the chiro but has had no relief. Pt has difficulty with rotation, especially with driving. He finds that this is the most limiting and concerning due to his travel for work. Pt was testing and does not have RA. Pt has known OA into the neck following a recent x-ray. Pt is a back sleeper and turning his head is extremely uncomfortable when waking up after a night. Moving from a static position is very uncomfortable. Pt notes that thin pillows make it worse. Pt denies NT or UE weakness. Pt denies 5D's 3N's. Pt states the pain feels deep. Pain does not linger for more than 20-30s. Pt has tried light AROM but is unsure of how much to push.    Hand dominance: Right  PERTINENT HISTORY:  Bilat RC repairs, elbow arthroscopy, DM2, sleep apnea  PAIN:  Are you having pain? No: NPRS scale: 5/10; 6/10 at worst Pain location: central C/S Pain description: sharp pain with turning Aggravating factors: rotational motions, waking up in the morning, driving  Relieving factors: rest,   PRECAUTIONS: None  WEIGHT BEARING RESTRICTIONS: No  FALLS:  Has patient fallen in last 6 months? 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  Anterior osteophytes were   noted.  Facet joint arthropathy was noted.   Impression: These findings are consistent with cervical spondylosis and  facet joint arthropathy.  PATIENT SURVEYS:  FOTO 59 68 @ DC  6/6 FOTO 75   TODAY'S TREATMENT:                                                                                                                              DATE:  6/6  Manual: C2-7 UPA grade IV; grade IV side glide; manual traction; manual traction with chin nod  SCM stretch 30s 3x T/S ext with towel roll 5s 10x   Symptom management, HEP, POC change, self management of pain, freq of exercise in order to maintain ROM    5/30  Manual traction Cervical PROM/manual stretching STM cervical paraspinals/SO release  Supine chin tuck 5" x10 TB row/ext GTB 2x15 ea Bilateral ER RTB 2x10 Horizontal abduction 2x10  5/16  Manual: C2-7 UPA grade IV; grade IV side glide; manual traction; manual traction with chin nod  Cervical SNAG rotation 10x each; extension and combined R SB 10x each  Chin tuck supine 10x  Chin tuck with rotation 10x each      5/8    Exercises - Seated Cervical Retraction  - 2-3  x daily - 7 x weekly - 1 sets - 10 reps - Seated Cervical Retraction and Rotation  - 2-3 x daily - 7 x weekly - 1 sets - 10 reps - Seated Levator Scapulae Stretch  - 2 x daily - 7 x weekly - 1 sets - 3 reps - 30 hold - Seated Assisted Cervical Rotation with Towel  - 2 x daily - 7 x weekly - 3 sets - 10 reps  PATIENT EDUCATION: Education details: anatomy, exercise progression, DOMS expectations, acceptable levels of pain, thermotherapy, massage therapy,  envelope of function, HEP, POC  Person educated: Patient Education method: Explanation, Demonstration, Tactile cues, Verbal cues, and Handouts Education comprehension: verbalized understanding, returned demonstration, verbal cues required, tactile cues required, and needs further education   HOME EXERCISE PROGRAM:  Access Code: [email or web address] URL:  [email or web address] Date: 08/25/2022 Prepared by: Zebedee Iba     ASSESSMENT:   CLINICAL IMPRESSION: Pt with improved ROM and pain free rotation following manual therapy. Pt able to reach at least 75 deg of rotation to the L and 65 to the R without pain as compared to beginning of session. Pt HEP updated today with T/S mobility in addition to lateral neck stretch. Pt advised on frequency of edu at home to maintain ROM. As pt is able to maintain current ROM gains with consistency of HEP at home, plan to decrease freq of visits at this time. Pt has met and exceeded expected FOTO outcome measure. Pt would benefit from continued skilled therapy in order to reach goals and maximize functional C/S ROM for prevention of further functional decline.    OBJECTIVE IMPAIRMENTS decreased strength, impaired UE functional use, improper body mechanics, postural dysfunction, pain, and hypomobility .    ACTIVITY LIMITATIONS community activity, occupation, dressing, shopping, bathing, self-care, and exercise/recreation .    PERSONAL FACTORS: 1-2comorbidities, and Time since onset of injury/illness/exacerbation are also affecting patient's functional outcome.     REHAB POTENTIAL: Good   CLINICAL DECISION MAKING: stable/uncomplicated   EVALUATION COMPLEXITY: Low   GOALS: SHORT TERM GOALS: Target Date 10/06/2022     Pt  will become independent with final HEP in order to demonstrate synthesis of PT education on pain management and progression of upper quarter function.      Goal status: met   2.  Pt will be able to demonstrate ability to perform AROM C/S without pain in order to demonstrate functional improvement in upper quarter for self-care and house hold duties.      Goal status: met   3.  Pt will be able to demonstrate at least 75% cervical rotation  in order to demonstrate functional improvement in upper quarter and cervical ROM for driving related tasks.      Goal status: met       LONG TERM GOALS: Target Date 11/17/2022     Pt  will become independent with final HEP in order to demonstrate synthesis of PT education on pain management and progression of upper quarter function.      Goal status: INITIAL   2.  Pt will be able to demonstrate/report ability to sit/stand/sleep for extended periods of time without pain in order to demonstrate functional improvement and tolerance to static positioning.     Goal status: INITIAL   3.  Pt will score >/= 68 on FOTO to demonstrate improvement in perceived neck function.    MET     PLAN: PT FREQUENCY: 1x/week   PT DURATION: 12weeks  (likely D/C in 6-8)  PLANNED INTERVENTIONS: Therapeutic exercises, Therapeutic activity, Neuromuscular re-education, Balance training, Gait training, Patient/Family education, Self Care, Joint mobilization, Joint manipulation, DME instructions, Aquatic Therapy, Dry Needling, Electrical stimulation, Spinal manipulation, Spinal mobilization, Cryotherapy, Moist heat, scar mobilization, Splintting, Taping, Vasopneumatic device, Traction, Ultrasound, Ionotophoresis 4mg /ml Dexamethasone, Manual therapy, and Re-evaluation   PLAN FOR NEXT SESSION: cervical mobs/manual traction;  cervical mobility as tolerated       Zebedee Iba, PT 09/23/2022, 8:07 PM

## 2022-09-27 ENCOUNTER — Encounter: Payer: Self-pay | Admitting: Family Medicine

## 2022-09-27 ENCOUNTER — Ambulatory Visit (INDEPENDENT_AMBULATORY_CARE_PROVIDER_SITE_OTHER): Payer: 59 | Admitting: Family Medicine

## 2022-09-27 ENCOUNTER — Encounter (HOSPITAL_BASED_OUTPATIENT_CLINIC_OR_DEPARTMENT_OTHER): Payer: 59

## 2022-09-27 VITALS — BP 92/60 | HR 75 | Temp 97.8°F | Ht 71.0 in | Wt 198.5 lb

## 2022-09-27 DIAGNOSIS — E119 Type 2 diabetes mellitus without complications: Secondary | ICD-10-CM | POA: Diagnosis not present

## 2022-09-27 DIAGNOSIS — Z7984 Long term (current) use of oral hypoglycemic drugs: Secondary | ICD-10-CM | POA: Diagnosis not present

## 2022-09-27 LAB — POCT GLYCOSYLATED HEMOGLOBIN (HGB A1C): Hemoglobin A1C: 6.6 % — AB (ref 4.0–5.6)

## 2022-09-27 MED ORDER — SCOPOLAMINE 1 MG/3DAYS TD PT72: 1.0000 | MEDICATED_PATCH | TRANSDERMAL | 0 refills | Status: AC

## 2022-09-27 NOTE — Progress Notes (Signed)
Established Patient Office Visit  Subjective   Patient ID: Spencer Duffy, male    DOB: 07/29/1965  Age: 57 y.o. MRN: [id number]  Chief Complaint  Patient presents with   Medical Management of Chronic Issues    HPI   [name] is here for medical follow-up.  Recent left foot surgery and is healing well from that.  Overall doing fairly well.  He has history of type 2 diabetes, obstructive sleep apnea and GERD.  Has been on regimen of metformin and Jardiance for quite some time for his diabetes and this has been consistently well-controlled.  Compliant with medications.  Due for lipids in August and plans to set up physical then.  He is getting yearly diabetic eye exams.  He has upcoming deep sea fishing trip and has had motion sickness in the past.  Requesting prescription for scopolamine.  No contraindications such as glaucoma.  Past Medical History:  Diagnosis Date   Arthritis    Diabetes mellitus    TYPE II   GERD (gastroesophageal reflux disease)    Hay fever    ALLERGIES   Heart murmur    Past Surgical History:  Procedure Laterality Date   BILATERAL KNEE ARTHROSCOPY     X2   ELBOW ARTHROSCOPY WITH TENDON RECONSTRUCTION Right 12/22/2016   Neuroplasty Digital Nerve Right 06/21/2013   @ [facility]   ROTATOR CUFF REPAIR Bilateral    SKIN CANCER EXCISION     ON CHEST   Tarsal Exostectomy Right 06/21/2013   @ [facility]    reports that he has never smoked. He has been exposed to tobacco smoke. He has quit using smokeless tobacco. He reports current alcohol use. He reports that he does not use drugs. family history includes Arthritis in an other family member; Colon cancer in his paternal grandmother; Diabetes in his brother, maternal grandmother, paternal grandmother, sister, and another family member; Head & neck cancer in his brother; Heart attack in his father; Hypertension in an other family member; Lung cancer in his father; Stroke in his paternal grandfather. Allergies  Allergen Reactions    Aleve [Naproxen] Other (See Comments)    Pt became very hot and throat was dry and itchy    Review of Systems  Constitutional:  Negative for malaise/fatigue.  Eyes:  Negative for blurred vision.  Respiratory:  Negative for shortness of breath.   Cardiovascular:  Negative for chest pain.  Neurological:  Negative for dizziness, weakness and headaches.      Objective:     BP 92/60 (BP Location: Left Arm, Patient Position: Sitting, Cuff Size: Normal)   Pulse 75   Temp 97.8 F (36.6 C) (Oral)   Ht 5\' 11"  (1.803 m)   Wt 198 lb 8 oz (90 kg)   SpO2 98%   BMI 27.69 kg/m  BP Readings from Last 3 Encounters:  09/27/22 92/60  09/23/22 121/76  06/24/22 105/68   Wt Readings from Last 3 Encounters:  09/27/22 198 lb 8 oz (90 kg)  09/23/22 198 lb (89.8 kg)  06/24/22 201 lb (91.2 kg)      Physical Exam Vitals reviewed.  Constitutional:      Appearance: He is well-developed.  HENT:     Right Ear: External ear normal.     Left Ear: External ear normal.  Eyes:     Pupils: Pupils are equal, round, and reactive to light.  Neck:     Thyroid: No thyromegaly.  Cardiovascular:     Rate and Rhythm: Normal rate  and regular rhythm.  Pulmonary:     Effort: Pulmonary effort is normal. No respiratory distress.     Breath sounds: Normal breath sounds. No wheezing or rales.  Musculoskeletal:     Cervical back: Neck supple.  Neurological:     Mental Status: He is alert and oriented to person, place, and time.      Results for orders placed or performed in visit on 09/27/22  POC HgB A1c  Result Value Ref Range   Hemoglobin A1C 6.6 (A) 4.0 - 5.6 %   HbA1c POC (<> result, manual entry)     HbA1c, POC (prediabetic range)     HbA1c, POC (controlled diabetic range)      Last CBC Lab Results  Component Value Date   WBC 5.5 12/04/2021   HGB 13.4 12/04/2021   HCT 39.1 12/04/2021   MCV 90.3 12/04/2021   MCH 30.6 04/07/2018   RDW 12.8 12/04/2021   PLT 250.0 12/04/2021   Last  metabolic panel Lab Results  Component Value Date   GLUCOSE 130 (H) 12/04/2021   NA 137 12/04/2021   K 4.7 12/04/2021   CL 102 12/04/2021   CO2 29 12/04/2021   BUN 18 12/04/2021   CREATININE 0.93 12/04/2021   CALCIUM 9.1 12/04/2021   PROT 6.4 12/04/2021   ALBUMIN 4.5 12/04/2021   BILITOT 0.6 12/04/2021   ALKPHOS 75 12/04/2021   AST 16 12/04/2021   ALT 20 12/04/2021   Last lipids Lab Results  Component Value Date   CHOL 114 12/04/2021   HDL 42.80 12/04/2021   LDLCALC 57 12/04/2021   TRIG 68.0 12/04/2021   CHOLHDL 3 12/04/2021   Last hemoglobin A1c Lab Results  Component Value Date   HGBA1C 6.6 (A) 09/27/2022      The ASCVD Risk score (Arnett DK, et al., 2019) failed to calculate for the following reasons:   The valid total cholesterol range is 130 to 320 mg/dL    Assessment & Plan:   #1 type 2 diabetes controlled with A1c 6.6%.  Continue current regimen.  Continue yearly diabetic eye exam.  #2 dyslipidemia treated with rosuvastatin 10 mg daily.  Recheck fasting lipids at follow-up in August or September  #3 Motion sickness prevention.  Wrote prescription for scopolamine patches 1 patch behind the ear every 3 days as needed.  He has no contraindications.  He plans to use this for deep sea fishing trip   No follow-ups on file.    Evelena Peat, MD

## 2022-09-27 NOTE — Patient Instructions (Signed)
Set up physical for couple of months or so from now.  A1C today 6.6.

## 2022-10-14 ENCOUNTER — Encounter (HOSPITAL_BASED_OUTPATIENT_CLINIC_OR_DEPARTMENT_OTHER): Payer: Self-pay | Admitting: Physical Therapy

## 2022-10-14 ENCOUNTER — Ambulatory Visit (HOSPITAL_BASED_OUTPATIENT_CLINIC_OR_DEPARTMENT_OTHER): Payer: 59 | Admitting: Physical Therapy

## 2022-10-14 DIAGNOSIS — M19041 Primary osteoarthritis, right hand: Secondary | ICD-10-CM | POA: Diagnosis not present

## 2022-10-14 DIAGNOSIS — M542 Cervicalgia: Secondary | ICD-10-CM

## 2022-10-14 NOTE — Therapy (Signed)
OUTPATIENT PHYSICAL THERAPY CERVICAL TREATMENT  PHYSICAL THERAPY DISCHARGE SUMMARY  Visits from Start of Care: 6 6 Plan: Patient agrees to discharge.  Patient goals were  met. Patient is being discharged due to meeting the stated rehab goals.         Patient Name: Spencer Duffy MRN: [id number] DOB:[birth year], 57 y.o., male Today's Date: 10/14/2022  END OF SESSION:  PT End of Session - 10/14/22 0928     Visit Number 6    Number of Visits 14    Date for PT Re-Evaluation 11/23/22    Authorization Type [employer]    PT Start Time 0846    PT Stop Time 0924    PT Time Calculation (min) 38 min    Activity Tolerance Patient tolerated treatment well    Behavior During Therapy WFL for tasks assessed/performed                 Past Medical History:  Diagnosis Date   Arthritis    Diabetes mellitus    TYPE II   GERD (gastroesophageal reflux disease)    Hay fever    ALLERGIES   Heart murmur    Past Surgical History:  Procedure Laterality Date   BILATERAL KNEE ARTHROSCOPY     X2   ELBOW ARTHROSCOPY WITH TENDON RECONSTRUCTION Right 12/22/2016   Neuroplasty Digital Nerve Right 06/21/2013   @ [facility]   ROTATOR CUFF REPAIR Bilateral    SKIN CANCER EXCISION     ON CHEST   Tarsal Exostectomy Right 06/21/2013   @ [facility]   Patient Active Problem List   Diagnosis Date Noted   Insomnia 06/10/2020   Hallux rigidus of right foot 10/09/2019   Obstructive sleep apnea 08/31/2019   Avulsion of skin of elbow, right, initial encounter 08/05/2016   Status post foot surgery 07/03/2013   Type II diabetes mellitus, well controlled (HCC) 06/01/2013   Foot pain, right 05/11/2013   Bone spur of right foot 05/11/2013   Neuralgia of right foot 05/11/2013   Acute right lower quadrant pain 12/14/2012   Right lateral epicondylitis 05/11/2010   SHOULDER PAIN 10/16/2008   AODM 07/17/2008   GERD 07/17/2008    PCP: Kristian Covey, MD   REFERRING PROVIDER:   Pollyann Savoy, MD    REFERRING  DIAG:  Diagnosis  M50.30 (ICD-10-CM) - DDD (degenerative disc disease), cervical    THERAPY DIAG:  Cervicalgia  Rationale for Evaluation and Treatment: Rehabilitation  ONSET DATE: Feb 2024  SUBJECTIVE:  SUBJECTIVE STATEMENT:  Pt states that his C-pap strap flares it up at night. Pt still has some R sided pain as he turns but has been able to manage the pain fairly well himself with HEP.    Eval: Pt states the pain started just prior to Feb. Pt had gone to the chiro but has had no relief. Pt has difficulty with rotation, especially with driving. He finds that this is the most limiting and concerning due to his travel for work. Pt was testing and does not have RA. Pt has known OA into the neck following a recent x-ray. Pt is a back sleeper and turning his head is extremely uncomfortable when waking up after a night. Moving from a static position is very uncomfortable. Pt notes that thin pillows make it worse. Pt denies NT or UE weakness. Pt denies 5D's 3N's. Pt states the pain feels deep. Pain does not linger for more than 20-30s. Pt has tried light AROM but is unsure of how much to push.    Hand dominance: Right  PERTINENT HISTORY:  Bilat RC repairs, elbow arthroscopy, DM2, sleep apnea  PAIN:  Are you having pain? Yes: NPRS scale: 1/10; 6/10 at worst Pain location: central C/S Pain description: sharp pain with turning Aggravating factors: rotational motions, waking up in the morning, driving  Relieving factors: rest,   PRECAUTIONS: None  WEIGHT BEARING RESTRICTIONS: No  FALLS:  Has patient fallen in last 6 months? 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  Anterior osteophytes were  noted.  Facet joint arthropathy was noted.   Impression: These findings are consistent with cervical spondylosis and  facet joint arthropathy.  PATIENT SURVEYS:  FOTO 59 68 @ DC  6/6 FOTO 75   TODAY'S TREATMENT:                                                                                                                              DATE:  6/27  HEP taper, exercise progression, self management of pain  Program Notes Towel extension- R and L side bend 10x each side   Exercises - Seated Cervical Retraction  - 2-3 x daily - 7 x weekly - 1 sets - 10 reps - Seated Cervical Retraction and Rotation  - 2-3 x daily - 7 x weekly - 1 sets - 10 reps - Seated Levator Scapulae Stretch  - 2 x daily - 7 x weekly - 1 sets - 3 reps - 30 hold - Seated Assisted Cervical Rotation with Towel  - 2 x daily - 7 x weekly - 3 sets - 10 reps - Sternocleidomastoid  Stretch  - 2 x daily - 7 x weekly - 1 sets - 3 reps - 30 hold - Seated Thoracic Lumbar Extension  - 1 x daily - 7 x weekly - 2 sets - 10 reps  6/6  Manual: C2-7 UPA grade IV; grade IV side glide; manual traction; manual traction with chin nod  SCM stretch 30s 3x T/S ext with towel roll 5s 10x   Symptom management, HEP, POC change, self management of pain, freq of exercise in order to maintain ROM    5/30  Manual traction Cervical PROM/manual stretching STM cervical paraspinals/SO release  Supine chin tuck 5" x10 TB row/ext GTB 2x15 ea Bilateral ER RTB 2x10 Horizontal abduction 2x10  5/16  Manual: C2-7 UPA grade IV; grade IV side glide; manual traction; manual traction with chin nod  Cervical SNAG rotation 10x each; extension and combined R SB 10x each  Chin tuck  supine 10x  Chin tuck with rotation 10x each      5/8    Exercises - Seated Cervical Retraction  - 2-3 x daily - 7 x weekly - 1 sets - 10 reps - Seated Cervical Retraction and Rotation  - 2-3 x daily - 7 x weekly - 1 sets - 10 reps - Seated Levator Scapulae Stretch  - 2 x daily - 7 x weekly - 1 sets - 3 reps - 30 hold - Seated Assisted Cervical Rotation with Towel  - 2 x daily - 7 x weekly - 3 sets - 10 reps  PATIENT EDUCATION: Education details: anatomy, exercise progression, HEP, POC  Person educated: Patient Education method: Explanation, Demonstration, Tactile cues, Verbal cues, and Handouts Education comprehension: verbalized understanding, returned demonstration, verbal cues required, tactile cues required, and needs further education   HOME EXERCISE PROGRAM:  Access Code: [email or web address] URL: [email or web address] Date: 08/25/2022 Prepared by: Zebedee Iba     ASSESSMENT:   CLINICAL IMPRESSION: Pt has met all PT goals at this time and has good understanding of self management of pain. Pt has good understanding of need for HEP and modifications needed for aggravating factors. Pt advised to ask CPAP fitting specialist about alternative strap placement or types in order to reduce muscle irritation and pain. Pt with no further needs for therapy at this time unless there is an exacerbation in pain. Plan to D/C this episode of care.    OBJECTIVE IMPAIRMENTS decreased strength, impaired UE functional use, improper body mechanics, postural dysfunction, pain, and hypomobility .    ACTIVITY LIMITATIONS community activity, occupation, dressing, shopping, bathing, self-care, and exercise/recreation .    PERSONAL FACTORS: 1-2comorbidities, and Time since onset of injury/illness/exacerbation are also affecting patient's functional outcome.     REHAB POTENTIAL: Good   CLINICAL DECISION MAKING: stable/uncomplicated   EVALUATION COMPLEXITY: Low   GOALS: SHORT TERM GOALS:  Target Date 10/06/2022     Pt  will become independent with final HEP in order to demonstrate synthesis of PT education on pain management and progression of upper quarter function.      Goal status: met   2.  Pt will be able to demonstrate ability to perform AROM C/S without pain in order to demonstrate functional improvement in upper quarter for self-care and house hold duties.      Goal status: met   3.  Pt will be able to demonstrate at least 75% cervical rotation  in order to demonstrate functional improvement in upper quarter and cervical ROM for driving related tasks.      Goal  status: met      LONG TERM GOALS: Target Date 11/17/2022     Pt  will become independent with final HEP in order to demonstrate synthesis of PT education on pain management and progression of upper quarter function.      Goal status: met   2.  Pt will be able to demonstrate/report ability to sit/stand/sleep for extended periods of time without pain in order to demonstrate functional improvement and tolerance to static positioning.     Goal status: met   3.  Pt will score >/= 68 on FOTO to demonstrate improvement in perceived neck function.    MET     PLAN: PT FREQUENCY: 1x/week   PT DURATION: 12weeks  (likely D/C in 6-8)    PLANNED INTERVENTIONS: Therapeutic exercises, Therapeutic activity, Neuromuscular re-education, Balance training, Gait training, Patient/Family education, Self Care, Joint mobilization, Joint manipulation, DME instructions, Aquatic Therapy, Dry Needling, Electrical stimulation, Spinal manipulation, Spinal mobilization, Cryotherapy, Moist heat, scar mobilization, Splintting, Taping, Vasopneumatic device, Traction, Ultrasound, Ionotophoresis 4mg /ml Dexamethasone, Manual therapy, and Re-evaluation   PLAN FOR NEXT SESSION: cervical mobs/manual traction;  cervical mobility as tolerated       Zebedee Iba, PT 10/14/2022, 9:40 AM

## 2022-11-04 ENCOUNTER — Encounter (HOSPITAL_BASED_OUTPATIENT_CLINIC_OR_DEPARTMENT_OTHER): Payer: 59 | Admitting: Physical Therapy

## 2022-11-27 ENCOUNTER — Other Ambulatory Visit: Payer: Self-pay | Admitting: Family Medicine

## 2023-01-03 ENCOUNTER — Encounter: Payer: 59 | Admitting: Family Medicine

## 2023-01-14 ENCOUNTER — Other Ambulatory Visit: Payer: Self-pay | Admitting: Family Medicine

## 2023-02-01 ENCOUNTER — Ambulatory Visit: Payer: 59 | Admitting: Family Medicine

## 2023-02-01 ENCOUNTER — Encounter: Payer: Self-pay | Admitting: Family Medicine

## 2023-02-01 VITALS — BP 110/74 | HR 59 | Temp 97.7°F | Ht 71.65 in | Wt 203.7 lb

## 2023-02-01 DIAGNOSIS — Z7984 Long term (current) use of oral hypoglycemic drugs: Secondary | ICD-10-CM | POA: Diagnosis not present

## 2023-02-01 DIAGNOSIS — Z23 Encounter for immunization: Secondary | ICD-10-CM | POA: Diagnosis not present

## 2023-02-01 DIAGNOSIS — Z0000 Encounter for general adult medical examination without abnormal findings: Secondary | ICD-10-CM | POA: Diagnosis not present

## 2023-02-01 DIAGNOSIS — E119 Type 2 diabetes mellitus without complications: Secondary | ICD-10-CM | POA: Diagnosis not present

## 2023-02-01 NOTE — Progress Notes (Signed)
Established Patient Office Visit  Subjective   Patient ID: Spencer Duffy, male    DOB: February 09, 1966  Age: 57 y.o. MRN: [id number]  No chief complaint on file.   HPI   Spencer Duffy is seen today for physical exam.  He has history of obstructive sleep apnea, type 2 diabetes, hyperlipidemia.  Generally doing well.  [place]  Health maintenance reviewed:  Health Maintenance  Topic Date Due   FOOT EXAM  08/10/2013   Diabetic kidney evaluation - eGFR measurement  12/05/2022   Diabetic kidney evaluation - Urine ACR  12/05/2022   COVID-19 Vaccine (4 - 2023-24 season) 12/19/2022   HIV Screening  12/02/2026 (Originally 11/29/1981)   OPHTHALMOLOGY EXAM  02/02/2023   HEMOGLOBIN A1C  03/29/2023   Colonoscopy  03/31/2027   DTaP/Tdap/Td (4 - Td or Tdap) 12/05/2031   INFLUENZA VACCINE  Completed   Hepatitis C Screening  Completed   Zoster Vaccines- Shingrix  Completed   HPV VACCINES  Aged Out   -Would like to get flu vaccine here today. -Has diabetic eye exam set up for next week  Social history-married with 2 children's.  His son is finishing up undergrad and plans to enroll in doctorate program at [employer].  His daughter is finishing up vet school at [employer].  Non-smoker.  No regular alcohol use.   Family history-Brother and sister with type 2 diabetes.  His father had lung cancer and history of heart disease.  His father may have had aortic stenosis but apparently died of MI around age 61.  Mother is 48.  Has history of diabetes but otherwise healthy.  Past Medical History:  Diagnosis Date   Arthritis    Diabetes mellitus    TYPE II   GERD (gastroesophageal reflux disease)    Hay fever    ALLERGIES   Heart murmur    Past Surgical History:  Procedure Laterality Date   BILATERAL KNEE ARTHROSCOPY     X2   ELBOW ARTHROSCOPY WITH TENDON RECONSTRUCTION Right 12/22/2016   Neuroplasty Digital Nerve Right 06/21/2013   @ [facility]    ROTATOR CUFF REPAIR Bilateral    SKIN CANCER EXCISION     ON CHEST   Tarsal Exostectomy Right 06/21/2013   @ [facility]    reports that he has never smoked. He has been exposed to tobacco smoke. He has quit using smokeless tobacco. He reports current alcohol use. He reports that he does not use drugs. family history includes Arthritis in an other family member; Colon cancer in his paternal grandmother; Diabetes in his brother, maternal grandmother, paternal grandmother, sister, and another family member; Head & neck cancer in his brother; Heart attack in his father; Hypertension in an other family member; Lung cancer in his father; Stroke in his paternal grandfather. Allergies  Allergen Reactions   Aleve [Naproxen] Other (See Comments)    Pt became very hot and throat was dry and itchy     Review of Systems  Constitutional:  Negative for chills, fever, malaise/fatigue and weight loss.  HENT:  Negative for hearing loss.   Eyes:  Negative for blurred vision and double vision.  Respiratory:  Negative for cough and shortness of breath.   Cardiovascular:  Negative for chest pain, palpitations and leg swelling.  Gastrointestinal:  Negative for abdominal pain, blood in stool, constipation and diarrhea.  Genitourinary:  Negative for dysuria.  Skin:  Negative for rash.  Neurological:  Negative for dizziness, speech change, seizures, loss of consciousness and headaches.  Psychiatric/Behavioral:  Negative for depression.       Objective:     BP 110/74 (BP Location: Left Arm, Patient Position: Sitting, Cuff Size: Normal)   Pulse (!) 59   Temp 97.7 F (36.5 C) (Oral)   Ht 5' 11.65" (1.82 m)   Wt 203 lb 11.2 oz (92.4 kg)   SpO2 99%   BMI 27.89 kg/m  BP Readings from Last 3 Encounters:  02/01/23 110/74  09/27/22 92/60  09/23/22 121/76   Wt Readings from Last 3 Encounters:  02/01/23 203 lb 11.2 oz (92.4 kg)  09/27/22 198 lb 8 oz (90 kg)  09/23/22 198 lb (89.8 kg)      Physical  Exam Vitals reviewed.  Constitutional:      General: He is not in acute distress.    Appearance: He is well-developed. He is not ill-appearing.  HENT:     Head: Normocephalic and atraumatic.     Right Ear: External ear normal.     Left Ear: External ear normal.  Eyes:     Pupils: Pupils are equal, round, and reactive to light.  Neck:     Thyroid: No thyromegaly.  Cardiovascular:     Rate and Rhythm: Normal rate and regular rhythm.     Heart sounds: Normal heart sounds. No murmur heard. Pulmonary:     Effort: No respiratory distress.     Breath sounds: No wheezing or rales.  Abdominal:     General: Bowel sounds are normal. There is no distension.     Palpations: Abdomen is soft. There is no mass.     Tenderness: There is no abdominal tenderness. There is no guarding or rebound.  Musculoskeletal:     Cervical back: Normal range of motion and neck supple.     Right lower leg: No edema.     Left lower leg: No edema.  Lymphadenopathy:     Cervical: No cervical adenopathy.  Skin:    Findings: No rash.  Neurological:     Mental Status: He is alert and oriented to person, place, and time.     Cranial Nerves: No cranial nerve deficit.      No results found for any visits on 02/01/23.    The ASCVD Risk score (Arnett DK, et al., 2019) failed to calculate for the following reasons:   The valid total cholesterol range is 130 to 320 mg/dL    Assessment & Plan:   Problem List Items Addressed This Visit       Unprioritized   Type II diabetes mellitus, well controlled (HCC) - Primary   Relevant Orders   Microalbumin / creatinine urine ratio   Hemoglobin A1c   Other Visit Diagnoses     Physical exam       Relevant Orders   Basic metabolic panel   Lipid panel   CBC with Differential/Platelet   Hepatic function panel   PSA   Need for influenza vaccination       Relevant Orders   Flu vaccine trivalent PF, 6mos and older(Flulaval,Afluria,Fluarix,Fluzone) (Completed)      Here for complete physical today.    -Flu vaccine given. -Shingrix already given -Prior hepatitis C screen negative -Tetanus due 2033 -Colonoscopy due 2028 -Obtain labs as above.  Include follow-up A1c and microalbumin screen with his diabetes history.  No follow-ups on file.    Evelena Peat, MD

## 2023-02-03 ENCOUNTER — Other Ambulatory Visit: Payer: 59

## 2023-02-03 ENCOUNTER — Other Ambulatory Visit (INDEPENDENT_AMBULATORY_CARE_PROVIDER_SITE_OTHER): Payer: 59

## 2023-02-03 DIAGNOSIS — E119 Type 2 diabetes mellitus without complications: Secondary | ICD-10-CM

## 2023-02-03 DIAGNOSIS — Z0000 Encounter for general adult medical examination without abnormal findings: Secondary | ICD-10-CM | POA: Diagnosis not present

## 2023-02-03 LAB — BASIC METABOLIC PANEL
BUN: 19 mg/dL (ref 6–23)
CO2: 24 meq/L (ref 19–32)
Calcium: 9.7 mg/dL (ref 8.4–10.5)
Chloride: 103 meq/L (ref 96–112)
Creatinine, Ser: 0.96 mg/dL (ref 0.40–1.50)
GFR: 88.57 mL/min (ref >60.00)
Glucose, Bld: 126 mg/dL — ABNORMAL HIGH (ref 70–99)
Potassium: 4.2 meq/L (ref 3.5–5.1)
Sodium: 141 meq/L (ref 135–145)

## 2023-02-03 LAB — CBC WITH DIFFERENTIAL/PLATELET
Basophils Absolute: 0 10*3/uL (ref 0.0–0.1)
Basophils Relative: 0.3 % (ref 0.0–3.0)
Eosinophils Absolute: 0.1 10*3/uL (ref 0.0–0.7)
Eosinophils Relative: 0.9 % (ref 0.0–5.0)
HCT: 42.5 % (ref 39.0–52.0)
Hemoglobin: 14.2 g/dL (ref 13.0–17.0)
Lymphocytes Relative: 26.3 % (ref 12.0–46.0)
Lymphs Abs: 1.7 10*3/uL (ref 0.7–4.0)
MCHC: 33.4 g/dL (ref 30.0–36.0)
MCV: 93.1 fL (ref 78.0–100.0)
Monocytes Absolute: 0.4 10*3/uL (ref 0.1–1.0)
Monocytes Relative: 6.9 % (ref 3.0–12.0)
Neutro Abs: 4.3 10*3/uL (ref 1.4–7.7)
Neutrophils Relative %: 65.6 % (ref 43.0–77.0)
Platelets: 256 10*3/uL (ref 150.0–400.0)
RBC: 4.57 Mil/uL (ref 4.22–5.81)
RDW: 12.7 % (ref 11.5–15.5)
WBC: 6.5 10*3/uL (ref 4.0–10.5)

## 2023-02-03 LAB — LIPID PANEL
Cholesterol: 121 mg/dL (ref 0–200)
HDL: 54.1 mg/dL (ref >39.00)
LDL Cholesterol: 55 mg/dL (ref 0–99)
NonHDL: 67.34
Total CHOL/HDL Ratio: 2
Triglycerides: 61 mg/dL (ref 0.0–149.0)
VLDL: 12.2 mg/dL (ref 0.0–40.0)

## 2023-02-03 LAB — MICROALBUMIN / CREATININE URINE RATIO
Creatinine,U: 15.1 mg/dL
Microalb Creat Ratio: 4.6 mg/g (ref 0.0–30.0)
Microalb, Ur: <0.7 mg/dL (ref 0.0–1.9)

## 2023-02-03 LAB — PSA: PSA: 0.3 ng/mL (ref 0.10–4.00)

## 2023-02-03 LAB — HEPATIC FUNCTION PANEL
ALT: 20 U/L (ref 0–53)
AST: 15 U/L (ref 0–37)
Albumin: 4.6 g/dL (ref 3.5–5.2)
Alkaline Phosphatase: 77 U/L (ref 39–117)
Bilirubin, Direct: 0.2 mg/dL (ref 0.0–0.3)
Total Bilirubin: 0.6 mg/dL (ref 0.2–1.2)
Total Protein: 6.8 g/dL (ref 6.0–8.3)

## 2023-02-03 LAB — HEMOGLOBIN A1C: Hgb A1c MFr Bld: 7 % — ABNORMAL HIGH (ref 4.6–6.5)

## 2023-02-07 LAB — HM DIABETES EYE EXAM

## 2023-02-13 ENCOUNTER — Other Ambulatory Visit: Payer: Self-pay | Admitting: Family Medicine

## 2023-02-21 ENCOUNTER — Ambulatory Visit (INDEPENDENT_AMBULATORY_CARE_PROVIDER_SITE_OTHER): Payer: 59 | Admitting: Family Medicine

## 2023-02-21 ENCOUNTER — Encounter: Payer: Self-pay | Admitting: Family Medicine

## 2023-02-21 VITALS — BP 100/70 | HR 70 | Temp 97.5°F | Ht 71.0 in | Wt 194.5 lb

## 2023-02-21 DIAGNOSIS — S29019A Strain of muscle and tendon of unspecified wall of thorax, initial encounter: Secondary | ICD-10-CM

## 2023-02-21 MED ORDER — CYCLOBENZAPRINE HCL 10 MG PO TABS: 10.0000 mg | ORAL_TABLET | Freq: Three times a day (TID) | ORAL | 0 refills | Status: DC | PRN

## 2023-02-21 NOTE — Progress Notes (Signed)
Established Patient Office Visit  Subjective   Patient ID: Spencer Duffy, male    DOB: December 31, 1965  Age: 57 y.o. MRN: [id number]  Chief Complaint  Patient presents with   Back Pain    HPI   [name] is seen today with right sided mid thoracic back pain.  First noted a couple weeks ago.  He has [place].  Denies any specific injury.  Pain is sharp and shooting at times and worse with movement.  Prickly worse rolling over in bed.  Better after about 45 minutes of getting up and moving around.  He has tried several things including topical sports cream, ibuprofen 600 mg, and Tylenol with limited improvement.  Muscles do feel tight.  Denies any other specific injuries  Past Medical History:  Diagnosis Date   Arthritis    Diabetes mellitus    TYPE II   GERD (gastroesophageal reflux disease)    Hay fever    ALLERGIES   Heart murmur    Past Surgical History:  Procedure Laterality Date   BILATERAL KNEE ARTHROSCOPY     X2   ELBOW ARTHROSCOPY WITH TENDON RECONSTRUCTION Right 12/22/2016   Neuroplasty Digital Nerve Right 06/21/2013   @ [facility]   ROTATOR CUFF REPAIR Bilateral    SKIN CANCER EXCISION     ON CHEST   Tarsal Exostectomy Right 06/21/2013   @ [facility]    reports that he has never smoked. He has been exposed to tobacco smoke. He has quit using smokeless tobacco. He reports current alcohol use. He reports that he does not use drugs. family history includes Arthritis in an other family member; Colon cancer in his paternal grandmother; Diabetes in his brother, maternal grandmother, paternal grandmother, sister, and another family member; Head & neck cancer in his brother; Heart attack in his father; Hypertension in an other family member; Lung cancer in his father; Stroke in his paternal grandfather. Allergies  Allergen Reactions   Aleve [Naproxen] Other (See Comments)    Pt became very hot and throat was dry and itchy     Review of Systems  Constitutional:  Negative for weight loss.  Respiratory:  Negative for cough and shortness of breath.   Cardiovascular:  Negative for chest pain.      Objective:     BP 100/70 (BP Location: Left Arm, Patient Position: Sitting, Cuff Size: Normal)   Pulse 70   Temp (!) 97.5 F (36.4 C) (Oral)   Ht 5\' 11"  (1.803 m)   Wt 194 lb 8 oz (88.2 kg)   SpO2 99%   BMI 27.13 kg/m  BP Readings from Last 3 Encounters:  02/21/23 100/70  02/01/23 110/74  09/27/22 92/60   Wt Readings from Last 3 Encounters:  02/21/23 194 lb 8 oz (88.2 kg)  02/01/23 203 lb 11.2 oz (92.4 kg)  09/27/22 198 lb 8 oz (90 kg)      Physical Exam Vitals reviewed.  Constitutional:      General: He is not in acute distress.    Appearance: He is not ill-appearing.  Cardiovascular:     Rate and Rhythm: Normal rate and regular rhythm.  Pulmonary:     Effort: Pulmonary effort is normal. No respiratory distress.     Breath sounds: Normal breath sounds. No wheezing, rhonchi or rales.  Musculoskeletal:     Comments: He has some increased palpable muscle tension right mid thoracic area just right of  the spine.  No spinal tenderness.  Neurological:     Mental Status: He is alert.      No results found for any visits on 02/21/23.  Last CBC Lab Results  Component Value Date   WBC 6.5 02/03/2023   HGB 14.2 02/03/2023   HCT 42.5 02/03/2023   MCV 93.1 02/03/2023   MCH 30.6 04/07/2018   RDW 12.7 02/03/2023   PLT 256.0 02/03/2023   Last metabolic panel Lab Results  Component Value Date   GLUCOSE 126 (H) 02/03/2023   NA 141 02/03/2023   K 4.2 02/03/2023   CL 103 02/03/2023   CO2 24 02/03/2023   BUN 19 02/03/2023   CREATININE 0.96 02/03/2023   GFR 88.57 02/03/2023   CALCIUM 9.7 02/03/2023   PROT 6.8 02/03/2023   ALBUMIN 4.6 02/03/2023   BILITOT 0.6 02/03/2023   ALKPHOS 77 02/03/2023   AST 15 02/03/2023   ALT 20 02/03/2023   Last lipids Lab Results  Component Value Date    CHOL 121 02/03/2023   HDL 54.10 02/03/2023   LDLCALC 55 02/03/2023   TRIG 61.0 02/03/2023   CHOLHDL 2 02/03/2023   Last hemoglobin A1c Lab Results  Component Value Date   HGBA1C 7.0 (H) 02/03/2023      The ASCVD Risk score (Arnett DK, et al., 2019) failed to calculate for the following reasons:   The valid total cholesterol range is 130 to 320 mg/dL    Assessment & Plan:   Right sided thoracic muscle strain.  Suspect from recent physical work.  Probably has significant amount of muscle spasm.  -Discussed ongoing conservative therapies including heat, muscle massage, topical sports creams, and nonsteroidal with either over-the-counter ibuprofen or meloxicam 15 mg daily which he has at home -Add Flexeril 10 mg every 8 hours as needed for muscle spasm.  He is aware this may be sedating -If not improving with the above over the next week or so be in touch and consider trial of physical therapy  Evelena Peat, MD

## 2023-04-03 NOTE — Progress Notes (Deleted)
HPI M never smoker followed for OSA, Insomnia,  complicated by GERD, DM2, HST 09/27/19- AHI 17.2/ hr, desaturation to 82%, body weight 208 lbs  --------------------------------------------------------------------------------  04/02/22-  56 yoM never smoker followed for OSA, Insomnia,  complicated by GERD, DM2, Covid infection August 2022,  - Lunesta 3 mg   Tried clonazepam, Belsomra 20,  CPAP auto 5-20/ [employer]-  compliance    66.7%, AHI 2.9/ hr                                          Body weight today- 203 lbs Covid vax-3 Phizer Flu vax-had His CPAP works well.  When traveling he uses an oral appliance which he had refitted by Dr. Myrtis Ser. Alfonso Patten works very well for his chronic insomnia.  He has wanted to keep a backup medication to minimize risk of developing tolerance but could not tell if Belsomra was effective at all.  We discussed Ambien as an option and we will give him a prescription to hold.  04/04/23- 57 yoM never smoker followed for OSA, Insomnia,  complicated by GERD, DM2, Covid infection August 2022,  - Lunesta 3 mg   Tried clonazepam and Belsomra 20,  CPAP auto 5-20/ [employer] Download-  compliance                                          Body weight today-   ROS-see HPI   + = positive Constitutional:    weight loss, night sweats, fevers, chills, fatigue, lassitude. HEENT:    headaches, difficulty swallowing, tooth/dental problems, sore throat,       sneezing, itching, ear ache, nasal congestion, post nasal drip, snoring CV:    chest pain, orthopnea, PND, swelling in lower extremities, anasarca,                                   dizziness, palpitations Resp:   shortness of breath with exertion or at rest.                productive cough,   non-productive cough, coughing up of blood.              change in color of mucus.  wheezing.   Skin:    rash or lesions. GI:  No-   heartburn, indigestion, abdominal pain, nausea, vomiting, diarrhea,                 change in  bowel habits, loss of appetite GU: dysuria, change in color of urine, no urgency or frequency.   flank pain. MS:  + joint pain, stiffness, decreased range of motion, back pain. Neuro-     nothing unusual Psych:  change in mood or affect.  depression or anxiety.   memory loss.  OBJ- Physical Exam General- Alert, Oriented, Affect-appropriate, Distress- none acute, + appears fit Skin- rash-none, lesions- none, excoriation- none Lymphadenopathy- none Head- atraumatic            Eyes- Gross vision intact, PERRLA, conjunctivae and secretions clear            Ears- Hearing, canals-normal            Nose- Clear, no-Septal dev, mucus, polyps, erosion, perforation  Throat- Mallampati II-III , mucosa clear , drainage- none, tonsils- atrophic Neck- flexible , trachea midline, no stridor , thyroid nl, carotid no bruit Chest - symmetrical excursion , unlabored           Heart/CV- RRR , no murmur , no gallop  , no rub, nl s1 s2                           - JVD- none , edema- none, stasis changes- none, varices- none           Lung- clear to P&A, wheeze- none, cough- none , dullness-none, rub- none           Chest wall-  Abd-  Br/ Gen/ Rectal- Not done, not indicated Extrem- Neuro- grossly intact to observation

## 2023-04-05 ENCOUNTER — Other Ambulatory Visit: Payer: Self-pay | Admitting: Internal Medicine

## 2023-04-05 ENCOUNTER — Ambulatory Visit: Payer: 59 | Admitting: Internal Medicine

## 2023-04-06 ENCOUNTER — Other Ambulatory Visit: Payer: Self-pay | Admitting: Internal Medicine

## 2023-04-06 NOTE — Telephone Encounter (Signed)
Ambien refilled

## 2023-04-08 NOTE — Telephone Encounter (Signed)
Eszopiclone refilled 

## 2023-04-11 ENCOUNTER — Encounter: Payer: Self-pay | Admitting: Family Medicine

## 2023-05-20 ENCOUNTER — Encounter: Payer: Self-pay | Admitting: Family Medicine

## 2023-05-20 MED ORDER — METFORMIN HCL 500 MG PO TABS: ORAL_TABLET | ORAL | 1 refills | Status: DC

## 2023-06-02 ENCOUNTER — Other Ambulatory Visit: Payer: Self-pay | Admitting: Family Medicine

## 2023-06-08 NOTE — Progress Notes (Unsigned)
 HPI Spencer Duffy never smoker followed for OSA, Insomnia,  complicated by GERD, DM2, HST 09/27/19- AHI 17.2/ hr, desaturation to 82%, body weight 208 lbs  --------------------------------------------------------------------------------   04/02/22-  58 yoM never smoker followed for OSA, Insomnia,  complicated by GERD, DM2, Covid infection August 2022,  - Lunesta 3 mg   Tried clonazepam, Belsomra 20,  CPAP auto 5-20/ [employer]-  compliance    66.7%, AHI 2.9/ hr                                          Body weight today- 203 lbs Covid vax-3 Phizer Flu vax-had His CPAP works well.  When traveling he uses an oral appliance which he had refitted by Dr. Myrtis Duffy. Alfonso Patten works very well for his chronic insomnia.  He has wanted to keep a backup medication to minimize risk of developing tolerance but could not tell if Belsomra was effective at all.  We discussed Ambien as an option and we will give him a prescription to hold.  06/10/23-  58 yoM never smoker followed for OSA, Insomnia,  complicated by GERD, DM2, Covid infection August 2022,  - Ambien 10 mg/ Lunesta 3 mg,   Tried clonazepam, Belsomra 20, Oral Appliance([employer]atz) for travel- uncomfortable despite refit, so no longer using. CPAP auto 5-20/ [employer]-  compliance    48.9%, AHI 5.8/hr      Missed nights while travelling.                            Body weight today- 203 lbs Discussed the use of AI scribe software for clinical note transcription with the patient, who gave verbal consent to proceed.  History of Present Illness   The patient, with a history of sleep apnea, presents for a routine follow-up. He reports consistent use of his CPAP machine, with an average of 7 hours and 42 minutes of use per night and an apnea-hypopnea index (AHI) of 4.2. However, he notes that on a recent night when he only used the machine for 2 hours and 25 minutes, his AHI was 14.5. He has tried using an oral appliance for travel, but found it uncomfortable and it  caused sores in his mouth. 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 He also reports alternating use of Ambien and Lunesta to aid sleep, with occasional nights of not taking either medication.     ROS-see HPI   + = positive Constitutional:    weight loss, night sweats, fevers, chills, fatigue, lassitude. HEENT:    headaches, difficulty swallowing, tooth/dental problems, sore throat,       sneezing, itching, ear ache, nasal congestion, post nasal drip, snoring CV:    chest pain, orthopnea, PND, swelling in lower extremities, anasarca,                                   dizziness, palpitations Resp:   shortness of breath with exertion or at rest.                productive cough,   non-productive cough, coughing up of blood.  change in color of mucus.  wheezing.   Skin:    rash or lesions. GI:  No-   heartburn, indigestion, abdominal pain, nausea, vomiting, diarrhea,                 change in bowel habits, loss of appetite GU: dysuria, change in color of urine, no urgency or frequency.   flank pain. MS:   joint pain, stiffness, decreased range of motion, back pain.+stiff neck Neuro-     nothing unusual Psych:  change in mood or affect.  depression or anxiety.   memory loss.  OBJ- Physical Exam General- Alert, Oriented, Affect-appropriate, Distress- none acute, + appears fit Skin- rash-none, lesions- none, excoriation- none Lymphadenopathy- none Head- atraumatic            Eyes- Gross vision intact, PERRLA, conjunctivae and secretions clear            Ears- Hearing, canals-normal            Nose- Clear, no-Septal dev, mucus, polyps, erosion, perforation             Throat- Mallampati II-III , mucosa clear , drainage- none, tonsils- atrophic Neck- flexible , trachea midline, no stridor , thyroid nl, carotid no bruit Chest - symmetrical excursion  , unlabored           Heart/CV- RRR , no murmur , no gallop  , no rub, nl s1 s2                           - JVD- none , edema- none, stasis changes- none, varices- none           Lung- clear to P&A, wheeze- none, cough- none , dullness-none, rub- none           Chest wall-  Abd-  Br/ Gen/ Rectal- Not done, not indicated Extrem- Neuro- grossly intact to observation  Assessment and Plan    Obstructive Sleep Apnea Patient is compliant with CPAP use, reporting an average AHI of 4.2. Noted one night of poor compliance with an AHI of 14.5. Patient reports discomfort with oral appliance from Dr. Myrtis Duffy due to sores in the mouth. Patient prefers CPAP mask that connects at the top of the head. His [employer] machine is about 57 years old. -Continue CPAP use as tolerated. -Consider mask fitting with DME provider to explore other mask options.  Insomnia Patient is alternating between Ambien and Lunesta for sleep aid. Expressed concern about daily use. -Continue Ambien and Lunesta as needed for sleep. -Consider experimenting with half doses on nights when sleep aid is questionable.  COVID-19 Patient recently exposed to COVID-19 and developed symptoms. Resolved.. -No plan discussed.  General Health Maintenance No changes in breathing or heart health reported. Physical exam revealed steady heartbeat and clear lungs. -Continue current management.

## 2023-06-10 ENCOUNTER — Ambulatory Visit: Payer: 59 | Admitting: Internal Medicine

## 2023-06-10 ENCOUNTER — Encounter: Payer: Self-pay | Admitting: Internal Medicine

## 2023-06-10 VITALS — BP 122/60 | HR 67 | Temp 97.7°F | Ht 71.0 in | Wt 203.6 lb

## 2023-06-10 DIAGNOSIS — G4733 Obstructive sleep apnea (adult) (pediatric): Secondary | ICD-10-CM | POA: Diagnosis not present

## 2023-06-10 NOTE — Patient Instructions (Signed)
 We can continue CPAP auto 5-20  We can continue current meds for sleep  Please let us know if we can help

## 2023-06-13 ENCOUNTER — Encounter: Payer: Self-pay | Admitting: Podiatry

## 2023-06-13 ENCOUNTER — Ambulatory Visit (INDEPENDENT_AMBULATORY_CARE_PROVIDER_SITE_OTHER): Payer: 59

## 2023-06-13 ENCOUNTER — Ambulatory Visit (INDEPENDENT_AMBULATORY_CARE_PROVIDER_SITE_OTHER): Payer: 59 | Admitting: Podiatry

## 2023-06-13 DIAGNOSIS — M79672 Pain in left foot: Secondary | ICD-10-CM

## 2023-06-13 DIAGNOSIS — S9032XA Contusion of left foot, initial encounter: Secondary | ICD-10-CM

## 2023-06-13 MED ORDER — HYDROCODONE-ACETAMINOPHEN 5-325 MG PO TABS: 1.0000 | ORAL_TABLET | Freq: Four times a day (QID) | ORAL | 0 refills | Status: DC | PRN

## 2023-06-15 NOTE — Progress Notes (Signed)
 Subjective: Chief Complaint  Patient presents with   Foot Injury    RM#14 dropped a log on left foot no swelling some bruising patient just concerned about previous fusion.   58 year old male presents the office today for an acute appointment given pain to his left foot.  He had an injury that occurred on Saturday when a log fell on his foot.  This occurred on June 11, 2023.  He has been icing and taking over-the-counter pain reliever.  It hurts to put pressure on it but has felt somewhat better since the injury.  No other injuries at the time.  Objective: AAO x3, NAD DP/PT pulses palpable bilaterally, CRT less than 3 seconds Majority tenderness is localized on the dorsal aspect the first MTPJ on the left side on the of the hardware.  There is no other areas of pinpoint tenderness on the other metatarsals, toes or proximal or along the ankle.  There is some slight edema but is no erythema or warmth.  There are some mild bruising present. No pain with calf compression, swelling, warmth, erythema  Assessment: Contusion left foot  Plan: -All treatment options discussed with the patient including all alternatives, risks, complications.  -X-rays were obtained reviewed.  3 views obtained.  Hardware intact but any complicating factors.  No definitive evidence of acute fracture noted. -Given his discomfort I recommend he return to the cam boot.  Ice, elevation.  He can continue anti-inflammatories.  Did prescribe a short course of Vicodin as needed for pain. -Patient encouraged to call the office with any questions, concerns, change in symptoms.   Return if symptoms worsen or fail to improve.  If symptoms persist repeat x-ray and advised if they are not resolved in next 1 to 2 weeks to let me know or sooner if there is any worsening.   Vivi Barrack DPM

## 2023-07-18 ENCOUNTER — Ambulatory Visit (INDEPENDENT_AMBULATORY_CARE_PROVIDER_SITE_OTHER): Admitting: Family Medicine

## 2023-07-18 VITALS — BP 130/86 | HR 88 | Temp 98.2°F | Wt 194.5 lb

## 2023-07-18 DIAGNOSIS — M62838 Other muscle spasm: Secondary | ICD-10-CM | POA: Diagnosis not present

## 2023-07-18 MED ORDER — PREDNISONE 20 MG PO TABS: ORAL_TABLET | ORAL | 0 refills | Status: DC

## 2023-07-18 MED ORDER — HYDROCODONE-ACETAMINOPHEN 5-325 MG PO TABS: ORAL_TABLET | ORAL | 0 refills | Status: DC

## 2023-07-18 MED ORDER — METHOCARBAMOL 500 MG PO TABS: 500.0000 mg | ORAL_TABLET | Freq: Three times a day (TID) | ORAL | 1 refills | Status: DC | PRN

## 2023-07-18 NOTE — Progress Notes (Signed)
 Established Patient Office Visit  Subjective   Patient ID: Spencer Duffy, male    DOB: 11/03/1965  Age: 58 y.o. MRN: [id number]  Chief Complaint  Patient presents with   Neck Pain    Patient complains of neck pain, x3 days, Tried Ibuprofen, Flexeril,     HPI   Spencer Duffy is seen with bilateral neck pain and muscle stiffness.  [place] went fishing on his boat and noted small amount of soreness during the day Friday but this was particularly worse Saturday when he got up.  He denied any injury.  He had tremendous difficulty turning head to the left and initially his pain seem to be more left paracervical muscles that now seems to have some right-sided involvement as well.  He tried applying some heat, Aspercreme, Biofreeze, Flexeril, ibuprofen, and couple hydrocodone he had leftover without improvement.  Did get a little sleep with a hydrocodone left over from prior rx..  Tremendous difficulty sleeping though the past few nights secondary to discomfort.  His wife tried to massage the muscles but he had difficult time tolerating  Denies any radiculitis symptoms.  No upper extremity numbness.  No fever.  Past Medical History:  Diagnosis Date   Arthritis    Diabetes mellitus    TYPE II   GERD (gastroesophageal reflux disease)    Hay fever    ALLERGIES   Heart murmur    Past Surgical History:  Procedure Laterality Date   BILATERAL KNEE ARTHROSCOPY     X2   ELBOW ARTHROSCOPY WITH TENDON RECONSTRUCTION Right 12/22/2016   Neuroplasty Digital Nerve Right 06/21/2013   @ [facility]   ROTATOR CUFF REPAIR Bilateral    SKIN CANCER EXCISION     ON CHEST   Tarsal Exostectomy Right 06/21/2013   @ [facility]    reports that he has never smoked. He has been exposed to tobacco smoke. He has quit using smokeless tobacco. He reports current alcohol use. He reports that he does not use drugs. family history includes Arthritis in an other family member; Colon cancer in his paternal  grandmother; Diabetes in his brother, maternal grandmother, paternal grandmother, sister, and another family member; Head & neck cancer in his brother; Heart attack in his father; Hypertension in an other family member; Lung cancer in his father; Stroke in his paternal grandfather. Allergies  Allergen Reactions   Aleve [Naproxen] Other (See Comments)    Pt became very hot and throat was dry and itchy    Review of Systems  Constitutional:  Negative for chills and fever.  Cardiovascular:  Negative for chest pain.  Musculoskeletal:  Positive for neck pain.  Neurological:  Negative for tingling and focal weakness.      Objective:     BP 130/86 (BP Location: Left Arm, Patient Position: Sitting, Cuff Size: Normal)   Pulse 88   Temp 98.2 F (36.8 C) (Oral)   Wt 194 lb 8 oz (88.2 kg)   SpO2 97%   BMI 27.13 kg/m  BP Readings from Last 3 Encounters:  07/18/23 130/86  06/10/23 122/60  02/21/23 100/70   Wt Readings from Last 3 Encounters:  07/18/23 194 lb 8 oz (88.2 kg)  06/10/23 203 lb 9.6 oz (92.4 kg)  02/21/23 194 lb 8 oz (88.2 kg)      Physical Exam Vitals reviewed.  Constitutional:      General: He is not in acute distress.    Appearance: He is  not ill-appearing.  Neck:     Comments: He has some bilateral paracervical muscle tenderness and stiffness.  Bilateral trapezius tenderness.  Can slowly flex neck a few degrees but has difficulty with extension and rotation to the right or left more than just a few degrees secondary to pain Neurological:     Mental Status: He is alert.     Comments: Good grip strength bilaterally.  Normal sensory function upper extremities.  1+ reflexes biceps and brachioradialis bilaterally      No results found for any visits on 07/18/23.    The ASCVD Risk score (Arnett DK, et al., 2019) failed to calculate for the following reasons:   The valid total cholesterol range is 130 to 320 mg/dL    Assessment & Plan:   Bilateral cervical neck  muscle pain and stiffness.  Does not seem to have a true torticollis.  Symptoms progressed over the past several days.  He tried multiple conservative things as above without relief.  We discussed trying the following  -Robaxin 500 mg every 6-8 hours as needed for muscle spasm -Continue moist heat as needed -Prednisone 20 mg 2 tablets daily for 5 days.  He is aware this will likely exacerbate his blood sugars short-term -Limited hydrocodone with Vicodin 5/3 on 25 mg 1-2 every 6 hours as needed for severe pain #20 with no refill -Continue topical sports creams and gentle muscle massage as tolerated -- Gradually try to increase range of motion -Consider trial of physical therapy if not improving over the next couple of days   Evelena Peat, MD

## 2023-07-18 NOTE — Patient Instructions (Signed)
 Continue application of moist heat intermittently  Continue topical sports creams and gentle muscle massage as tolerated  Try to gradually progress range of motion  Let me know if no improvement in a couple days and we can consider getting into see physical therapy

## 2023-08-05 ENCOUNTER — Other Ambulatory Visit: Payer: Self-pay | Admitting: Family Medicine

## 2023-09-05 ENCOUNTER — Encounter: Payer: Self-pay | Admitting: Family Medicine

## 2023-09-05 MED ORDER — METFORMIN HCL 500 MG PO TABS: ORAL_TABLET | ORAL | 1 refills | Status: DC

## 2023-11-18 NOTE — Procedures (Signed)
Mask fit

## 2023-11-29 ENCOUNTER — Other Ambulatory Visit: Payer: Self-pay | Admitting: Family Medicine

## 2023-12-26 ENCOUNTER — Ambulatory Visit (INDEPENDENT_AMBULATORY_CARE_PROVIDER_SITE_OTHER): Admitting: Family Medicine

## 2023-12-26 ENCOUNTER — Ambulatory Visit

## 2023-12-26 ENCOUNTER — Encounter: Payer: Self-pay | Admitting: Family Medicine

## 2023-12-26 VITALS — BP 104/74 | HR 74 | Temp 98.0°F | Wt 196.7 lb

## 2023-12-26 DIAGNOSIS — R0600 Dyspnea, unspecified: Secondary | ICD-10-CM

## 2023-12-26 DIAGNOSIS — R059 Cough, unspecified: Secondary | ICD-10-CM | POA: Diagnosis not present

## 2023-12-26 MED ORDER — HYDROCODONE BIT-HOMATROP MBR 5-1.5 MG/5ML PO SOLN: 5.0000 mL | Freq: Four times a day (QID) | ORAL | 0 refills | Status: DC | PRN

## 2023-12-26 NOTE — Progress Notes (Signed)
 Established Patient Office Visit  Subjective   Patient ID: Spencer Duffy, male    DOB: [birth year]  Age: 58 y.o. MRN: [id number]  Chief Complaint  Patient presents with   Cough   Shortness of Breath    HPI   Spencer Duffy is seen today with persistent cough which started back around the end of July.  He had been to [place] and then to [place]  prior to onset of symptoms.  He initially had some sore throat and nasal congestion.  Ended up going to urgent care on August 4 and had influenza and COVID testing which were negative.  He was diagnosed clinically with possible pneumonia although no chest x-ray was done.  He was given some type of hydrocodone  cough syrup along with Zithromax and prednisone  and taking over-the-counter Mucinex.  Sore throat and nasal congestion symptoms improved.  At this point he has persistent cough and does have some dyspnea with activity.  Symptoms seem to be worse late in the day.  No fever.  No chills.  No chest pain.  No orthopnea.  No peripheral edema.  He is not aware of any obvious persistent wheezing.  Patient is a non-smoker.  No chronic lung issues.  Past Medical History:  Diagnosis Date   Arthritis    Diabetes mellitus    TYPE II   GERD (gastroesophageal reflux disease)    Hay fever    ALLERGIES   Heart murmur    Past Surgical History:  Procedure Laterality Date   BILATERAL KNEE ARTHROSCOPY     X2   ELBOW ARTHROSCOPY WITH TENDON RECONSTRUCTION Right 12/22/2016   Neuroplasty Digital Nerve Right 06/21/2013   @ [facility]   ROTATOR CUFF REPAIR Bilateral    SKIN CANCER EXCISION     ON CHEST   Tarsal Exostectomy Right 06/21/2013   @ [facility]    reports that he has never smoked. He has been exposed to tobacco smoke. He has quit using smokeless tobacco. He reports current alcohol use. He reports that he does not use drugs. family history includes Arthritis in an other family member; Colon cancer in his paternal grandmother; Diabetes in his brother, maternal grandmother,  paternal grandmother, sister, and another family member; Head & neck cancer in his brother; Heart attack in his father; Hypertension in an other family member; Lung cancer in his father; Stroke in his paternal grandfather. Allergies  Allergen Reactions   Aleve [Naproxen] Other (See Comments)    Pt became very hot and throat was dry and itchy    Review of Systems  Constitutional:  Negative for chills and fever.  Respiratory:  Positive for cough, sputum production and shortness of breath. Negative for hemoptysis.   Cardiovascular:  Negative for chest pain and leg swelling.      Objective:     BP 104/74   Pulse 74   Temp 98 F (36.7 C) (Oral)   Wt 196 lb 11.2 oz (89.2 kg)   SpO2 97%   BMI 27.43 kg/m  BP Readings from Last 3 Encounters:  12/26/23 104/74  07/18/23 130/86  06/10/23 122/60   Wt Readings from Last 3 Encounters:  12/26/23 196 lb 11.2 oz (89.2 kg)  07/18/23 194 lb 8 oz (88.2 kg)  06/10/23 203 lb 9.6 oz (92.4 kg)      Physical Exam Vitals reviewed.  Constitutional:      General: He is not in acute distress.    Appearance: He is not ill-appearing.  HENT:  Mouth/Throat:     Mouth: Mucous membranes are moist.     Pharynx: Oropharynx is clear.  Cardiovascular:     Rate and Rhythm: Normal rate and regular rhythm.  Pulmonary:     Breath sounds: Normal breath sounds. No decreased breath sounds, wheezing or rales.  Neurological:     Mental Status: He is alert.      No results found for any visits on 12/26/23.    The ASCVD Risk score (Arnett DK, et al., 2019) failed to calculate for the following reasons:   The valid total cholesterol range is 130 to 320 mg/dL    Assessment & Plan:   Problem List Items Addressed This Visit   None Visit Diagnoses       Cough, unspecified type    -  Primary   Relevant Orders   DG Chest 2 View     Dyspnea, unspecified type       Relevant Orders   DG Chest 2 View     Spencer Duffy seen with onset almost 6 weeks ago of  cough and upper respiratory symptoms.  Was seen at urgent care and treated with Zithromax but has had some persistent cough.  Now has some dyspnea with activity as well. O2 sats 97% room air at rest.  -Obtain PA and lateral chest x-ray to further assess-although lung exam today is clear with no wheezes, rales, or diminished breath sounds - We elected to go ahead and send in a refill of cough medicine with Hycodan to use 1 teaspoon nightly as needed for severe cough.  Follow-up promptly for any fever, increased shortness of breath, or other concerns.  No follow-ups on file.    Wolm Scarlet, MD

## 2024-01-02 ENCOUNTER — Ambulatory Visit: Payer: Self-pay | Admitting: Family Medicine

## 2024-01-06 ENCOUNTER — Other Ambulatory Visit: Payer: Self-pay | Admitting: Internal Medicine

## 2024-01-06 NOTE — Telephone Encounter (Signed)
 Pt requesting refill of Ambien . Please advise, thank you!

## 2024-01-06 NOTE — Telephone Encounter (Signed)
 Ambien refilled

## 2024-01-18 ENCOUNTER — Ambulatory Visit (INDEPENDENT_AMBULATORY_CARE_PROVIDER_SITE_OTHER): Admitting: Orthopaedic Surgery

## 2024-01-18 ENCOUNTER — Ambulatory Visit (HOSPITAL_BASED_OUTPATIENT_CLINIC_OR_DEPARTMENT_OTHER)

## 2024-01-18 DIAGNOSIS — M542 Cervicalgia: Secondary | ICD-10-CM

## 2024-01-18 NOTE — Progress Notes (Signed)
 Chief Complaint: Neck pain     History of Present Illness:    Spencer Duffy is a 58 y.o. male presents today with 9 months of neck pain particular with turning to the side.  He is having pops and cracks in the neck which do cause significant pain.  He works in Consulting civil engineer and does get pain with persistent looking at his monitor but has also owns a small farm in [place] for which she is getting pain with most activities of this.  Denies any previous surgery.  He does take ibuprofen and Tylenol  which helps.    PMH/PSH/Family History/Social History/Meds/Allergies:    Past Medical History:  Diagnosis Date   Arthritis    Diabetes mellitus    TYPE II   GERD (gastroesophageal reflux disease)    Hay fever    ALLERGIES   Heart murmur    Past Surgical History:  Procedure Laterality Date   BILATERAL KNEE ARTHROSCOPY     X2   ELBOW ARTHROSCOPY WITH TENDON RECONSTRUCTION Right 12/22/2016   Neuroplasty Digital Nerve Right 06/21/2013   @ [facility]   ROTATOR CUFF REPAIR Bilateral    SKIN CANCER EXCISION     ON CHEST   Tarsal Exostectomy Right 06/21/2013   @ [facility]   Social History   Socioeconomic History   Marital status: Married    Spouse name: Not on file   Number of children: Not on file   Years of education: Not on file   Highest education level: Not on file  Occupational History    Employer: [employer]  Tobacco Use   Smoking status: Never    Passive exposure: Past   Smokeless tobacco: Former  Building services engineer status: Never Used  Substance and Sexual Activity   Alcohol use: Yes    Comment: wine occ    Drug use: No   Sexual activity: Not on file  Other Topics Concern   Not on file  Social History Narrative   Not on file   Social Drivers of Health   Financial Resource Strain: Not on file  Food Insecurity: Not on file  Transportation Needs: Not on file  Physical Activity: Not on file  Stress: Not on file  Social Connections: Not on file   Family History  Problem Relation  Age of Onset   Arthritis Other    Hypertension Other    Diabetes Other        PARENT, GRANDPARENT   Lung cancer Father    Heart attack Father    Colon cancer Paternal Grandmother        70's   Diabetes Paternal Grandmother    Diabetes Sister    Head & neck cancer Brother    Diabetes Brother    Diabetes Maternal Grandmother    Stroke Paternal Grandfather    Allergies  Allergen Reactions   Aleve [Naproxen] Other (See Comments)    Pt became very hot and throat was dry and itchy   Current Outpatient Medications  Medication Sig Dispense Refill   zolpidem  (AMBIEN ) 10 MG tablet TAKE 1 TABLET BY MOUTH AT BEDTIME AS NEEDED FOR SLEEP 30 tablet 5   Eszopiclone  3 MG TABS TAKE 1 TABLET AT BEDTIME ASNEEDED FOR SLEEP 90 tablet 1   HYDROcodone  bit-homatropine (HYCODAN) 5-1.5 MG/5ML syrup Take 5 mLs by mouth every 6 (six) hours as needed. 120 mL 0   JARDIANCE  10 MG TABS tablet TAKE 1 TABLET DAILY BEFORE BREAKFAST 90 tablet 1  metFORMIN  (GLUCOPHAGE ) 500 MG tablet TAKE 1 TABLET TWICE DAILY  WITH MEALS 180 tablet 1   Multiple Vitamin (MULTIVITAMIN) tablet Take 1 tablet by mouth daily.     ramipril  (ALTACE ) 5 MG capsule TAKE 1 CAPSULE DAILY 90 capsule 1   rosuvastatin  (CRESTOR ) 10 MG tablet TAKE 1 TABLET DAILY 90 tablet 1   scopolamine  (TRANSDERM-SCOP) 1 MG/3DAYS Place 1 patch (1.5 mg total) onto the skin every 3 (three) days. 4 patch 0   No current facility-administered medications for this visit.   No results found.  Review of Systems:   A ROS was performed including pertinent positives and negatives as documented in the HPI.  Physical Exam :   Constitutional: NAD and appears stated age Neurological: Alert and oriented Psych: Appropriate affect and cooperative There were no vitals taken for this visit.   Comprehensive Musculoskeletal Exam:    Presents with significant crepitus with 30 degrees range of motion in both directions of the cervical spine.  Flexion does cause some pain about  the posterior cervical spine.  No radiation to either arm.  Negative Hoffmann bilaterally.  Muscular sensory exam is intact completely in the right upper extremity as well as left upper extremity   Imaging:   Xray (4 view cervical spine): Multiple level degenerative disc disease with facet arthropathy   I personally reviewed and interpreted the radiographs.   Assessment and Plan:   58 y.o. male cervical multiple level degenerative disc disease with facet arthropathy.  At this time he has trialed physical therapy in the past.  Given this I would like to plan to refer him to Dr. Eldonna for discussion of possible radiofrequency ablation for his cervical arthritis.  I will plan to see him back as needed  -Return to clinic as needed   I personally saw and evaluated the patient, and participated in the management and treatment plan.  Elspeth Parker, MD Attending Physician, Orthopedic Surgery  This document was dictated using Dragon voice recognition software. A reasonable attempt at proof reading has been made to minimize errors.

## 2024-02-01 ENCOUNTER — Other Ambulatory Visit: Payer: Self-pay | Admitting: Family Medicine

## 2024-02-06 ENCOUNTER — Encounter: Payer: Self-pay | Admitting: Family Medicine

## 2024-02-06 ENCOUNTER — Ambulatory Visit: Payer: Self-pay | Admitting: Family Medicine

## 2024-02-06 ENCOUNTER — Ambulatory Visit (INDEPENDENT_AMBULATORY_CARE_PROVIDER_SITE_OTHER): Admitting: Family Medicine

## 2024-02-06 VITALS — BP 96/66 | HR 64 | Temp 98.0°F | Ht 71.65 in | Wt 195.7 lb

## 2024-02-06 DIAGNOSIS — Z23 Encounter for immunization: Secondary | ICD-10-CM

## 2024-02-06 DIAGNOSIS — E119 Type 2 diabetes mellitus without complications: Secondary | ICD-10-CM

## 2024-02-06 DIAGNOSIS — Z0000 Encounter for general adult medical examination without abnormal findings: Secondary | ICD-10-CM | POA: Diagnosis not present

## 2024-02-06 DIAGNOSIS — Z7984 Long term (current) use of oral hypoglycemic drugs: Secondary | ICD-10-CM

## 2024-02-06 LAB — CBC WITH DIFFERENTIAL/PLATELET
Basophils Absolute: 0 K/uL (ref 0.0–0.1)
Basophils Relative: 0.4 % (ref 0.0–3.0)
Eosinophils Absolute: 0.1 K/uL (ref 0.0–0.7)
Eosinophils Relative: 1.2 % (ref 0.0–5.0)
HCT: 41.7 % (ref 39.0–52.0)
Hemoglobin: 14.1 g/dL (ref 13.0–17.0)
Lymphocytes Relative: 31.9 % (ref 12.0–46.0)
Lymphs Abs: 1.6 K/uL (ref 0.7–4.0)
MCHC: 33.8 g/dL (ref 30.0–36.0)
MCV: 90.2 fl (ref 78.0–100.0)
Monocytes Absolute: 0.4 K/uL (ref 0.1–1.0)
Monocytes Relative: 7.5 % (ref 3.0–12.0)
Neutro Abs: 3 K/uL (ref 1.4–7.7)
Neutrophils Relative %: 59 % (ref 43.0–77.0)
Platelets: 260 K/uL (ref 150.0–400.0)
RBC: 4.63 Mil/uL (ref 4.22–5.81)
RDW: 13.2 % (ref 11.5–15.5)
WBC: 5.1 K/uL (ref 4.0–10.5)

## 2024-02-06 LAB — HEPATIC FUNCTION PANEL
ALT: 14 U/L (ref 0–53)
AST: 13 U/L (ref 0–37)
Albumin: 4.6 g/dL (ref 3.5–5.2)
Alkaline Phosphatase: 62 U/L (ref 39–117)
Bilirubin, Direct: 0.1 mg/dL (ref 0.0–0.3)
Total Bilirubin: 0.6 mg/dL (ref 0.2–1.2)
Total Protein: 6.4 g/dL (ref 6.0–8.3)

## 2024-02-06 LAB — LIPID PANEL
Cholesterol: 124 mg/dL (ref 0–200)
HDL: 45.5 mg/dL (ref >39.00)
LDL Cholesterol: 66 mg/dL (ref 0–99)
NonHDL: 78.04
Total CHOL/HDL Ratio: 3
Triglycerides: 62 mg/dL (ref 0.0–149.0)
VLDL: 12.4 mg/dL (ref 0.0–40.0)

## 2024-02-06 LAB — MICROALBUMIN / CREATININE URINE RATIO
Creatinine,U: 61.3 mg/dL
Microalb Creat Ratio: UNDETERMINED mg/g (ref 0.0–30.0)
Microalb, Ur: <0.7 mg/dL

## 2024-02-06 LAB — BASIC METABOLIC PANEL WITH GFR
BUN: 22 mg/dL (ref 6–23)
CO2: 27 meq/L (ref 19–32)
Calcium: 8.9 mg/dL (ref 8.4–10.5)
Chloride: 102 meq/L (ref 96–112)
Creatinine, Ser: 0.82 mg/dL (ref 0.40–1.50)
GFR: 97.73 mL/min (ref >60.00)
Glucose, Bld: 118 mg/dL — ABNORMAL HIGH (ref 70–99)
Potassium: 4.2 meq/L (ref 3.5–5.1)
Sodium: 137 meq/L (ref 135–145)

## 2024-02-06 LAB — HEMOGLOBIN A1C: Hgb A1c MFr Bld: 7.2 % — ABNORMAL HIGH (ref 4.6–6.5)

## 2024-02-06 MED ORDER — ROSUVASTATIN CALCIUM 10 MG PO TABS: 10.0000 mg | ORAL_TABLET | Freq: Every day | ORAL | 3 refills | Status: AC

## 2024-02-06 MED ORDER — METFORMIN HCL 500 MG PO TABS: ORAL_TABLET | ORAL | 3 refills | Status: AC

## 2024-02-06 MED ORDER — EMPAGLIFLOZIN 10 MG PO TABS: 10.0000 mg | ORAL_TABLET | Freq: Every day | ORAL | 3 refills | Status: AC

## 2024-02-06 NOTE — Progress Notes (Signed)
 Established Patient Office Visit  Subjective   Patient ID: Spencer Duffy, male    DOB: 09/12/1965  Age: 58 y.o. MRN: [id number]  Chief Complaint  Patient presents with   Annual Exam    HPI   Ramond seen today for physical exam.  He has history of type 2 diabetes, GERD, struct of sleep apnea, osteoarthritis of multiple joints, intermittent insomnia.  Generally doing well at this time.  His job is required a lot of travel recently.  Needs refills of several chronic medications today including Jardiance , metformin , and rosuvastatin .  His weight is down 8 pounds from last year and he states this is intentional and due to some recent dietary changes.  He is getting annual diabetic eye exam.  Health maintenance reviewed:  Health Maintenance  Topic Date Due   Pneumococcal Vaccine: 50+ Years (2 of 2 - PPSV23, PCV20, or PCV21) 10/05/2012   FOOT EXAM  08/10/2013   Diabetic kidney evaluation - Urine ACR  04/08/2019   HEMOGLOBIN A1C  08/04/2023   Influenza Vaccine  11/18/2023   COVID-19 Vaccine (4 - 2025-26 season) 12/19/2023   Diabetic kidney evaluation - eGFR measurement  02/03/2024   HIV Screening  12/02/2026 (Originally 11/29/1980)   OPHTHALMOLOGY EXAM  02/07/2024   Colonoscopy  03/31/2027   DTaP/Tdap/Td (4 - Td or Tdap) 12/05/2031   Hepatitis B Vaccines 19-59 Average Risk  Completed   Hepatitis C Screening  Completed   Zoster Vaccines- Shingrix   Completed   HPV VACCINES  Aged Out   Meningococcal B Vaccine  Aged Out   - Does need flu vaccine he consents to getting that. -He had prior Prevnar 13.  Declines 20 valent pneumonia vaccine  Family history-Brother and sister with type 2 diabetes.  His father had lung cancer and history of heart disease.  His father may have had aortic stenosis but apparently died of MI around age 29.  Mother is 11.  Has history of diabetes but otherwise healthy.  He does state his brother had oral cancer but history of oral tobacco use which was a causative  factor  Social history-married.  He has a daughter who finished vet school last year and is in Financial risk analyst.  Son is in Sports administrator at [employer].  Patient is non-smoker.  No regular alcohol use.  Past Medical History:  Diagnosis Date   Arthritis    Diabetes mellitus    TYPE II   GERD (gastroesophageal reflux disease)    Hay fever    ALLERGIES   Heart murmur    Past Surgical History:  Procedure Laterality Date   BILATERAL KNEE ARTHROSCOPY     X2   ELBOW ARTHROSCOPY WITH TENDON RECONSTRUCTION Right 12/22/2016   Neuroplasty Digital Nerve Right 06/21/2013   @ [facility]   ROTATOR CUFF REPAIR Bilateral    SKIN CANCER EXCISION     ON CHEST   Tarsal Exostectomy Right 06/21/2013   @ [facility]    reports that he has never smoked. He has been exposed to tobacco smoke. He has quit using smokeless tobacco. He reports current alcohol use. He reports that he does not use drugs. family history includes Arthritis in an other family member; Colon cancer in his paternal grandmother; Diabetes in his brother, maternal grandmother, paternal grandmother, sister, and another family member; Head & neck cancer in his brother; Heart attack in his father; Hypertension in an other family member; Lung cancer in his father; Stroke in his paternal grandfather. Allergies  Allergen  Reactions   Aleve [Naproxen] Other (See Comments)    Pt became very hot and throat was dry and itchy     Review of Systems  Constitutional:  Negative for chills, fever, malaise/fatigue and weight loss.  HENT:  Negative for hearing loss.   Eyes:  Negative for blurred vision and double vision.  Respiratory:  Negative for cough and shortness of breath.   Cardiovascular:  Negative for chest pain, palpitations and leg swelling.  Gastrointestinal:  Negative for abdominal pain, blood in stool, constipation and diarrhea.  Genitourinary:  Negative for dysuria.  Skin:  Negative for rash.  Neurological:  Negative for dizziness, speech  change, seizures, loss of consciousness and headaches.  Psychiatric/Behavioral:  Negative for depression.       Objective:     BP 96/66   Pulse 64   Temp 98 F (36.7 C) (Oral)   Ht 5' 11.65 (1.82 m)   Wt 195 lb 11.2 oz (88.8 kg)   SpO2 97%   BMI 26.80 kg/m  BP Readings from Last 3 Encounters:  02/06/24 96/66  12/26/23 104/74  07/18/23 130/86   Wt Readings from Last 3 Encounters:  02/06/24 195 lb 11.2 oz (88.8 kg)  12/26/23 196 lb 11.2 oz (89.2 kg)  07/18/23 194 lb 8 oz (88.2 kg)      Physical Exam Vitals reviewed.  Constitutional:      General: He is not in acute distress.    Appearance: He is well-developed.  HENT:     Head: Normocephalic and atraumatic.     Right Ear: External ear normal.     Left Ear: External ear normal.  Eyes:     Conjunctiva/sclera: Conjunctivae normal.     Pupils: Pupils are equal, round, and reactive to light.  Neck:     Thyroid : No thyromegaly.  Cardiovascular:     Rate and Rhythm: Normal rate and regular rhythm.     Heart sounds: Normal heart sounds. No murmur heard. Pulmonary:     Effort: No respiratory distress.     Breath sounds: No wheezing or rales.  Abdominal:     General: Bowel sounds are normal. There is no distension.     Palpations: Abdomen is soft. There is no mass.     Tenderness: There is no abdominal tenderness. There is no guarding or rebound.  Musculoskeletal:     Cervical back: Normal range of motion and neck supple.  Lymphadenopathy:     Cervical: No cervical adenopathy.  Skin:    Findings: No rash.     Comments: Generally very dry skin  Neurological:     Mental Status: He is alert and oriented to person, place, and time.     Cranial Nerves: No cranial nerve deficit.      No results found for any visits on 02/06/24.  Last CBC Lab Results  Component Value Date   WBC 6.5 02/03/2023   HGB 14.2 02/03/2023   HCT 42.5 02/03/2023   MCV 93.1 02/03/2023   MCH 30.6 04/07/2018   RDW 12.7 02/03/2023   PLT  256.0 02/03/2023   Last metabolic panel Lab Results  Component Value Date   GLUCOSE 126 (H) 02/03/2023   NA 141 02/03/2023   K 4.2 02/03/2023   CL 103 02/03/2023   CO2 24 02/03/2023   BUN 19 02/03/2023   CREATININE 0.96 02/03/2023   GFR 88.57 02/03/2023   CALCIUM  9.7 02/03/2023   PROT 6.8 02/03/2023   ALBUMIN 4.6 02/03/2023   BILITOT 0.6 02/03/2023  ALKPHOS 77 02/03/2023   AST 15 02/03/2023   ALT 20 02/03/2023   Last lipids Lab Results  Component Value Date   CHOL 121 02/03/2023   HDL 54.10 02/03/2023   LDLCALC 55 02/03/2023   TRIG 61.0 02/03/2023   CHOLHDL 2 02/03/2023   Last hemoglobin A1c Lab Results  Component Value Date   HGBA1C 7.0 (H) 02/03/2023      The ASCVD Risk score (Arnett DK, et al., 2019) failed to calculate for the following reasons:   The valid total cholesterol range is 130 to 320 mg/dL    Assessment & Plan:   Problem List Items Addressed This Visit       Unprioritized   Type II diabetes mellitus, well controlled (HCC)   Relevant Medications   rosuvastatin  (CRESTOR ) 10 MG tablet   metFORMIN  (GLUCOPHAGE ) 500 MG tablet   empagliflozin  (JARDIANCE ) 10 MG TABS tablet   Other Relevant Orders   Hemoglobin A1c   Microalbumin / creatinine urine ratio   Other Visit Diagnoses       Physical exam    -  Primary   Relevant Orders   Lipid panel   Basic metabolic panel with GFR   CBC with Differential/Platelet   Hepatic function panel     Here for annual physical.  Chronic medical problems as above.  Refilled several medications today as above.  We discussed following health maintenance items  -Flu vaccine given - Colonoscopy due 2028 - Due for urine microalbumin creatinine ratio.  Continue annual diabetic eye exam - Encouraged to continue annual dermatology follow-up - Shingrix  already given - We discussed pneumonia 20 Valent vaccine and he declines at this time but will consider at some point in the next couple years  No follow-ups on  file.    Wolm Scarlet, MD

## 2024-02-07 ENCOUNTER — Encounter: Payer: Self-pay | Admitting: Physical Medicine and Rehabilitation

## 2024-02-07 ENCOUNTER — Ambulatory Visit (INDEPENDENT_AMBULATORY_CARE_PROVIDER_SITE_OTHER): Admitting: Physical Medicine and Rehabilitation

## 2024-02-07 DIAGNOSIS — M47812 Spondylosis without myelopathy or radiculopathy, cervical region: Secondary | ICD-10-CM

## 2024-02-07 DIAGNOSIS — M47819 Spondylosis without myelopathy or radiculopathy, site unspecified: Secondary | ICD-10-CM | POA: Diagnosis not present

## 2024-02-07 DIAGNOSIS — M542 Cervicalgia: Secondary | ICD-10-CM | POA: Diagnosis not present

## 2024-02-07 MED ORDER — DIAZEPAM 5 MG PO TABS: ORAL_TABLET | ORAL | 0 refills | Status: DC

## 2024-02-07 NOTE — Progress Notes (Signed)
 Spencer Duffy - 58 y.o. male MRN [id number]  Date of birth: 06-15-1965  Office Visit Note: Visit Date: 02/07/2024 PCP: Micheal Wolm ORN, MD Referred by: Micheal Wolm ORN, MD  Subjective: Chief Complaint  Patient presents with   Neck - Pain   HPI: Spencer Duffy is a 58 y.o. male who comes in today per the request of Dr. Elspeth Parker for evaluation of chronic, worsening and severe bilateral neck pain. Pain started about 1 year ago. His pain worsens with side to side rotation of his neck. Also reports difficulty sleeping due to discomfort. He describes pain as sore, tight and aching sensation, currently rates as 8 out of 10. Some relief of pain with home exercise regimen, rest and use of medications. Minimal relief of pain with recent course of formal physical therapy. He has tried traction device at home with some relief of pain. 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 Recent cervical radiographs show mild degenerative changes and facet arthropathy. No history of cervical surgery/injections. Patient denies focal weakness, numbness and tingling. No recent trauma or falls.      Review of Systems  Musculoskeletal:  Positive for neck pain.  Neurological:  Negative for tingling, sensory change, focal weakness and weakness.  All other systems reviewed and are negative.  Otherwise per HPI.  Assessment & Plan: Visit Diagnoses:    ICD-10-CM   1. Cervicalgia  M54.2 MR CERVICAL SPINE WO CONTRAST    Ambulatory referral to Physical Medicine Rehab    2. Spondylosis without myelopathy or radiculopathy  M47.819 Ambulatory referral to Physical Medicine Rehab    3. Facet arthropathy, cervical  M47.812 Ambulatory referral to Physical Medicine Rehab       Plan: Findings:  Chronic, worsening and severe bilateral neck pain. No radicular symptoms down the arms. Patient continues to have severe pain despite good conservative therapies such as formal physical therapy,  home exercise regimen, rest and use of medications. Patients clinical presentation and exam are consistent with facet mediated pain. Recent cervical radiographs show multi level degenerative changes and facet arthropathy. He does have pain with side to side rotation of his neck today. We discussed treatment plan in detail today. Next step is to perform diagnostic bilateral C4-C5 and C5-C6 medial branch blocks under fluoroscopic guidance. If good relief of pain with diagnostic blocks we discussed possibility of longer sustained pain relief with radiofrequency ablation. I also placed order for cervical MRI imaging to rule out any potential red flag issues/structural abnormalities. Patient voiced issues with claustrophobia, I did prescribe pre-procedure Valium  for him to take prior to imaging. I discussed medial branch block and radiofrequency ablation procedure in detail, Dr. Eldonna also at bedside to answer questions. No red flag symptoms noted upon exam today.     Meds & Orders:  Meds ordered this encounter  Medications   diazepam  (VALIUM ) 5 MG tablet    Sig: Take one tablet by mouth with light food one hour prior to procedure.    Dispense:  1 tablet    Refill:  0    Orders Placed This Encounter  Procedures   MR CERVICAL SPINE WO CONTRAST   Ambulatory referral to Physical Medicine Rehab    Follow-up: Return for Bilateral C4-C5 and C5-C6 medial branch blocks.   Procedures: No procedures performed      Clinical History: No specialty comments available.   He reports that he has never smoked. He has been exposed to tobacco smoke.  He has quit using smokeless tobacco.  Recent Labs    02/06/24 0927  HGBA1C 7.2*    Objective:  VS:  HT:    WT:   BMI:     BP:   HR: bpm  TEMP: ( )  RESP:  Physical Exam Vitals and nursing note reviewed.  HENT:     Head: Normocephalic and atraumatic.     Right Ear: External ear normal.     Left Ear: External ear normal.     Nose: Nose normal.      Mouth/Throat:     Mouth: Mucous membranes are moist.  Eyes:     Extraocular Movements: Extraocular movements intact.  Cardiovascular:     Rate and Rhythm: Normal rate.     Pulses: Normal pulses.  Pulmonary:     Effort: Pulmonary effort is normal.  Abdominal:     General: Abdomen is flat. There is no distension.  Musculoskeletal:        General: Tenderness present.     Cervical back: Tenderness present.     Comments: Discomfort noted with extension and side to side rotation. Patient has good strength in the upper extremities including 5 out of 5 strength in wrist extension, long finger flexion and APB. Shoulder range of motion is full bilaterally without any sign of impingement. There is no atrophy of the hands intrinsically. Sensation intact bilaterally. Negative Hoffman's sign. Negative Spurling's sign.     Skin:    General: Skin is warm and dry.     Capillary Refill: Capillary refill takes less than 2 seconds.  Neurological:     General: No focal deficit present.     Mental Status: He is alert and oriented to person, place, and time.  Psychiatric:        Mood and Affect: Mood normal.        Behavior: Behavior normal.     Ortho Exam  Imaging: No results found.  Past Medical/Family/Surgical/Social History: Medications & Allergies reviewed per EMR, new medications updated. Patient Active Problem List   Diagnosis Date Noted   Insomnia 06/10/2020   Hallux rigidus of right foot 10/09/2019   Obstructive sleep apnea 08/31/2019   Avulsion of skin of elbow, right, initial encounter 08/05/2016   Status post foot surgery 07/03/2013   Type II diabetes mellitus, well controlled (HCC) 06/01/2013   Foot pain, right 05/11/2013   Bone spur of right foot 05/11/2013   Neuralgia of right foot 05/11/2013   Acute right lower quadrant pain 12/14/2012   Right lateral epicondylitis 05/11/2010   SHOULDER PAIN 10/16/2008   AODM 07/17/2008   GERD 07/17/2008   Past Medical History:  Diagnosis  Date   Arthritis    Diabetes mellitus    TYPE II   GERD (gastroesophageal reflux disease)    Hay fever    ALLERGIES   Heart murmur    Family History  Problem Relation Age of Onset   Arthritis Other    Hypertension Other    Diabetes Other        PARENT, GRANDPARENT   Lung cancer Father    Heart attack Father    Colon cancer Paternal Grandmother        83's   Diabetes Paternal Grandmother    Diabetes Sister    Head & neck cancer Brother    Diabetes Brother    Diabetes Maternal Grandmother    Stroke Paternal Grandfather    Past Surgical History:  Procedure Laterality Date   BILATERAL KNEE ARTHROSCOPY  X2   ELBOW ARTHROSCOPY WITH TENDON RECONSTRUCTION Right 12/22/2016   Neuroplasty Digital Nerve Right 06/21/2013   @ [facility]   ROTATOR CUFF REPAIR Bilateral    SKIN CANCER EXCISION     ON CHEST   Tarsal Exostectomy Right 06/21/2013   @ [facility]   Social History   Occupational History    Employer: [employer]  Tobacco Use   Smoking status: Never    Passive exposure: Past   Smokeless tobacco: Former  Building services engineer status: Never Used  Substance and Sexual Activity   Alcohol use: Yes    Comment: wine occ    Drug use: No   Sexual activity: Not on file

## 2024-02-07 NOTE — Progress Notes (Signed)
 Pain Scale   Average Pain 8 Patient advising he has chronic neck pain and stiffness he has had PT and it didn't help and advising he uses an IRON Neck and that does help with pain.        +Driver, -BT, -Dye Allergies.

## 2024-02-08 ENCOUNTER — Encounter: Payer: Self-pay | Admitting: Family Medicine

## 2024-02-08 LAB — OPHTHALMOLOGY REPORT-SCANNED

## 2024-02-13 ENCOUNTER — Telehealth: Payer: Self-pay

## 2024-02-13 NOTE — Telephone Encounter (Signed)
 Does patient need MRI prior to injection or is it OK to schedule?

## 2024-02-20 ENCOUNTER — Encounter: Payer: Self-pay | Admitting: Radiology

## 2024-02-28 ENCOUNTER — Ambulatory Visit
Admission: RE | Admit: 2024-02-28 | Discharge: 2024-02-28 | Disposition: A | Discharge status: Home / Self Care | Source: Ambulatory Visit | Attending: Physical Medicine and Rehabilitation | Admitting: Physical Medicine and Rehabilitation

## 2024-03-01 ENCOUNTER — Encounter: Payer: Self-pay | Admitting: Physical Medicine and Rehabilitation

## 2024-03-05 NOTE — Addendum Note (Signed)
 Addended by: METTA KRISTEN CROME on: 03/05/2024 10:16 AM   Modules accepted: Orders

## 2024-03-08 ENCOUNTER — Other Ambulatory Visit: Payer: Self-pay | Admitting: Internal Medicine

## 2024-03-09 NOTE — Telephone Encounter (Signed)
 Lunesta refilled

## 2024-03-14 ENCOUNTER — Encounter: Payer: Self-pay | Admitting: Physical Medicine and Rehabilitation

## 2024-03-14 ENCOUNTER — Ambulatory Visit: Admitting: Physical Medicine and Rehabilitation

## 2024-03-14 DIAGNOSIS — M542 Cervicalgia: Secondary | ICD-10-CM

## 2024-03-14 DIAGNOSIS — M47812 Spondylosis without myelopathy or radiculopathy, cervical region: Secondary | ICD-10-CM

## 2024-03-14 MED ORDER — MELOXICAM 15 MG PO TABS: 15.0000 mg | ORAL_TABLET | Freq: Every day | ORAL | 0 refills | Status: DC

## 2024-03-14 NOTE — Progress Notes (Signed)
 Pain Scale   Average Pain 8 Patient advised he has neck pain the radiates bilateral shoulders. Patient here for MRI review.         +Driver, -BT, -Dye Allergies.

## 2024-03-14 NOTE — Progress Notes (Signed)
 COLVIN BLATT - 58 y.o. male MRN [id number]  Date of birth: 09/01/1965  Office Visit Note: Visit Date: 03/14/2024 PCP: Micheal Wolm ORN, MD Referred by: Micheal Wolm ORN, MD  Subjective: Chief Complaint  Patient presents with   Neck - Pain   HPI: Spencer Duffy is a 58 y.o. male who comes in today for evaluation of chronic, worsening and severe bilateral neck pain, right greater than left. Pain ongoing for over a year. His pain worsens with side to side rotation, laying on right side of neck also causes severe discomfort. He describes pain as sharp sensation, currently rates as 8 out of 10. Some relief of pain with home exercise regimen, rest and use of medications. Minimal relief of pain with recent course of formal physical therapy. He has tried traction device at home with some relief of pain. Recent cervical MRI imaging shows mild-to-moderate facet arthritis throughout the cervical spine. Mild to moderate at C4-C5 and C5-C6. Shallow broad-based central and left paracentral protrusion at C3-C4 without central canal stenosis. Mild left foraminal narrowing is also seen at this level. Patient denies focal weakness, numbness and tingling. No recent trauma or falls.       Review of Systems  Musculoskeletal:  Negative for neck pain.  Neurological:  Negative for tingling, sensory change, focal weakness and weakness.  All other systems reviewed and are negative.  Otherwise per HPI.  Assessment & Plan: Visit Diagnoses:    ICD-10-CM   1. Cervicalgia  M54.2 Ambulatory referral to Physical Medicine Rehab    2. Facet arthropathy, cervical  M47.812 Ambulatory referral to Physical Medicine Rehab       Plan: Findings:  Chronic, worsening and severe bilateral neck pain. Patient continues to have severe pain despite good conservative therapies such as formal physical therapy, home exercise regimen, home exercise regimen, rest and use of medications. I discussed cervical MRI with him today using  imaging and spine model. There is multi level facet arthropathy noted. No significant nerve impingement. Patients clinical presentation and exam are consistent with facet mediated pain. He does have pain with side to side rotation of neck. His pain is consistent to lower aspect of neck, more C4-C5 and C5-C6 facet referral pattern. There is mild to moderate facet arthropathy at these levels. We discussed treatment plan in detail today. Next step is to perform diagnostic and hopefully therapeutic bilateral C4-C5 and C5-C6 facet joint injections under fluoroscopic guidance. If good relief of pain we can repeat these injections infrequently as needed. Could also look at radiofrequency ablation for longer sustained pain relief. We discussed medication management, I encouraged him to continue with topical Votaren gel, I also prescribed short course of meloxicam . He has no questions at this time. No red flag symptoms noted upon exam today.     Meds & Orders:  Meds ordered this encounter  Medications   meloxicam  (MOBIC ) 15 MG tablet    Sig: Take 1 tablet (15 mg total) by mouth daily.    Dispense:  30 tablet    Refill:  0    Orders Placed This Encounter  Procedures   Ambulatory referral to Physical Medicine Rehab    Follow-up: Return for Bilateral C4-C5 and C5-C6 facet joint injections.   Procedures: No procedures performed      Clinical History: CLINICAL DATA:  Neck pain for 8 months.  No known injury.   EXAM: MRI CERVICAL SPINE WITHOUT CONTRAST   TECHNIQUE: Multiplanar, multisequence MR imaging of the cervical spine was  performed. No intravenous contrast was administered.   COMPARISON:  Plain film cervical spine 01/18/2024.   FINDINGS: Alignment: Normal.   Vertebrae: No fracture, evidence of discitis, or bone lesion.   Cord: Normal signal throughout.   Posterior Fossa, vertebral arteries, paraspinal tissues: Negative.   Disc levels:   C2-3: Mild facet degenerative change on the  right. Otherwise negative.   C3-4: Mild-to-moderate bilateral facet arthritis is more notable on the left. There is a shallow broad-based central and left paracentral protrusion without central canal stenosis. The right foramen is open. Mild left foraminal narrowing noted.   C4-5: Mild-to-moderate bilateral facet arthritis. Otherwise negative.   C4-5: Mild-to-moderate bilateral facet arthritis. Otherwise negative.   C6-7: Mild facet degenerative change.  Otherwise negative.   C7-T1: Mild bilateral facet degenerative change. Otherwise negative.   IMPRESSION: 1. Mild-to-moderate facet arthritis throughout the cervical spine. 2. Shallow broad-based central and left paracentral protrusion at C3-4 without central canal stenosis. Mild left foraminal narrowing is also seen at this level. The foramina are otherwise open.     Electronically Signed   By: Debby Prader M.D.   On: 03/13/2024 09:07   He reports that he has never smoked. He has been exposed to tobacco smoke. He has quit using smokeless tobacco.  Recent Labs    02/06/24 0927  HGBA1C 7.2*    Objective:  VS:  HT:    WT:   BMI:     BP:   HR: bpm  TEMP: ( )  RESP:  Physical Exam Vitals and nursing note reviewed.  HENT:     Head: Normocephalic and atraumatic.     Right Ear: External ear normal.     Left Ear: External ear normal.     Nose: Nose normal.     Mouth/Throat:     Mouth: Mucous membranes are moist.  Eyes:     Pupils: Pupils are equal, round, and reactive to light.  Cardiovascular:     Rate and Rhythm: Normal rate.     Pulses: Normal pulses.  Pulmonary:     Effort: Pulmonary effort is normal.  Abdominal:     General: Abdomen is flat. There is no distension.  Musculoskeletal:        General: Tenderness present.     Cervical back: Tenderness present.     Comments: Pain noted with side-to-side rotation. Patient has good strength in the upper extremities including 5 out of 5 strength in wrist  extension, long finger flexion and APB. Shoulder range of motion is full bilaterally without any sign of impingement. There is no atrophy of the hands intrinsically. Sensation intact bilaterally. Negative Hoffman's sign. Negative Spurling's sign.     Skin:    General: Skin is warm and dry.     Capillary Refill: Capillary refill takes less than 2 seconds.  Neurological:     General: No focal deficit present.     Mental Status: He is alert and oriented to person, place, and time.  Psychiatric:        Mood and Affect: Mood normal.        Behavior: Behavior normal.     Ortho Exam  Imaging: No results found.  Past Medical/Family/Surgical/Social History: Medications & Allergies reviewed per EMR, new medications updated. Patient Active Problem List   Diagnosis Date Noted   Insomnia 06/10/2020   Hallux rigidus of right foot 10/09/2019   Obstructive sleep apnea 08/31/2019   Avulsion of skin of elbow, right, initial encounter 08/05/2016   Status post  foot surgery 07/03/2013   Type II diabetes mellitus, well controlled (HCC) 06/01/2013   Foot pain, right 05/11/2013   Bone spur of right foot 05/11/2013   Neuralgia of right foot 05/11/2013   Acute right lower quadrant pain 12/14/2012   Right lateral epicondylitis 05/11/2010   SHOULDER PAIN 10/16/2008   AODM 07/17/2008   GERD 07/17/2008   Past Medical History:  Diagnosis Date   Arthritis    Diabetes mellitus    TYPE II   GERD (gastroesophageal reflux disease)    Hay fever    ALLERGIES   Heart murmur    Family History  Problem Relation Age of Onset   Arthritis Other    Hypertension Other    Diabetes Other        PARENT, GRANDPARENT   Lung cancer Father    Heart attack Father    Colon cancer Paternal Grandmother        89's   Diabetes Paternal Grandmother    Diabetes Sister    Head & neck cancer Brother    Diabetes Brother    Diabetes Maternal Grandmother    Stroke Paternal Grandfather    Past Surgical History:   Procedure Laterality Date   BILATERAL KNEE ARTHROSCOPY     X2   ELBOW ARTHROSCOPY WITH TENDON RECONSTRUCTION Right 12/22/2016   Neuroplasty Digital Nerve Right 06/21/2013   @ [facility]   ROTATOR CUFF REPAIR Bilateral    SKIN CANCER EXCISION     ON CHEST   Tarsal Exostectomy Right 06/21/2013   @ [facility]   Social History   Occupational History    Employer: [employer]  Tobacco Use   Smoking status: Never    Passive exposure: Past   Smokeless tobacco: Former  Building Services Engineer status: Never Used  Substance and Sexual Activity   Alcohol use: Yes    Comment: wine occ    Drug use: No   Sexual activity: Not on file

## 2024-04-16 ENCOUNTER — Ambulatory Visit: Admitting: Physical Medicine and Rehabilitation

## 2024-04-16 ENCOUNTER — Other Ambulatory Visit: Payer: Self-pay

## 2024-04-16 VITALS — BP 114/72 | HR 55

## 2024-04-16 DIAGNOSIS — M47812 Spondylosis without myelopathy or radiculopathy, cervical region: Secondary | ICD-10-CM | POA: Diagnosis not present

## 2024-04-16 MED ORDER — METHYLPREDNISOLONE ACETATE 40 MG/ML IJ SUSP
40.0000 mg | Freq: Once | INTRAMUSCULAR | Status: AC
  Administered 2024-04-16: 40 mg

## 2024-04-16 NOTE — Progress Notes (Signed)
 Pain Scale   Average Pain 5 Patient advising he has chronic neck pain when turning head pain is constant.        +Driver, -BT, -Dye Allergies.

## 2024-04-16 NOTE — Progress Notes (Signed)
 "  Spencer Duffy - 58 y.o. male MRN [id number]  Date of birth: May 29, 1965  Office Visit Note: Visit Date: 04/16/2024 PCP: Micheal Wolm ORN, MD Referred by: Micheal Wolm ORN, MD  Subjective: Chief Complaint  Patient presents with   Neck - Pain   HPI:  Spencer Duffy is a 58 y.o. male who comes in today at the request of Duwaine Pouch, FNP for planned Bilateral  C4-5 and C5-6 Cervical facet/medial branch block with fluoroscopic guidance.  The patient has failed conservative care including home exercise, medications, time and activity modification.  This injection will be diagnostic and hopefully therapeutic.  Please see requesting physician notes for further details and justification.  Exam has shown concordant pain with facet joint loading.   ROS Otherwise per HPI.  Assessment & Plan: Visit Diagnoses:    ICD-10-CM   1. Cervical spondylosis without myelopathy  M47.812 XR C-ARM NO REPORT    Facet Injection    methylPREDNISolone  acetate (DEPO-MEDROL ) injection 40 mg      Plan: No additional findings.   Meds & Orders:  Meds ordered this encounter  Medications   methylPREDNISolone  acetate (DEPO-MEDROL ) injection 40 mg    Orders Placed This Encounter  Procedures   Facet Injection   XR C-ARM NO REPORT    Follow-up: Return for visit to requesting provider as needed.   Procedures: No procedures performed  Cervical Facet Joint Intra-Articular Injection with Fluoroscopic Guidance  Patient: Spencer Duffy      Date of Birth: [birth year] MRN: [id number] PCP: Micheal Wolm ORN, MD      Visit Date: 04/16/2024   Universal Protocol:    Date/Time: 12/29/20258:56 AM  Consent Given By: the patient  Position: PRONE  Additional Comments: Vital signs were monitored before and after the procedure. Patient was prepped and draped in the usual sterile fashion. The correct patient, procedure, and site was verified.   Injection Procedure Details:  Procedure Site One Meds Administered:  Meds  ordered this encounter  Medications   methylPREDNISolone  acetate (DEPO-MEDROL ) injection 40 mg     Laterality: Bilateral  Location/Site:  C4-5 C5-6  Needle size: 25 G  Needle type: Spinal  Needle Placement: Articular  Findings:  -Contrast Used: 0.5 mL iohexol 180 mg iodine/mL   -Comments: Excellent flow of contrast producing a partial arthrogram.  Procedure Details: The region overlying the facet joints mentioned above were localized under fluoroscopic visualization. The needle was inserted down to the level of the lateral mass of the superior articular process of the facet joint to be injected. Then, the needle was walked off inferiorly into the lateral aspect of the facet joint. Bi-planar images were used for confirming placement and spot radiographs were documented.  A 0.25 ml volume of Omnipaque-240 was injected into the facet joint and a standard partial arthrogram was obtained. Radiographs were obtained of the arthrogram. A 0.5 ml. volume of the steroid/anesthetic solution was injected into the joint. This procedure was repeated for each facet joint injected.   Additional Comments:  The patient tolerated the procedure well Dressing: Band-Aid    Post-procedure details: Patient was observed during the procedure. Post-procedure instructions were reviewed.  Patient left the clinic in stable condition.   Clinical History: CLINICAL DATA:  Neck pain for 8 months.  No known injury.   EXAM: MRI CERVICAL SPINE WITHOUT CONTRAST   TECHNIQUE: Multiplanar, multisequence MR imaging of the cervical spine was performed. No intravenous contrast was administered.   COMPARISON:  Plain film cervical spine 01/18/2024.  FINDINGS: Alignment: Normal.   Vertebrae: No fracture, evidence of discitis, or bone lesion.   Cord: Normal signal throughout.   Posterior Fossa, vertebral arteries, paraspinal tissues: Negative.   Disc levels:   C2-3: Mild facet degenerative change on the  right. Otherwise negative.   C3-4: Mild-to-moderate bilateral facet arthritis is more notable on the left. There is a shallow broad-based central and left paracentral protrusion without central canal stenosis. The right foramen is open. Mild left foraminal narrowing noted.   C4-5: Mild-to-moderate bilateral facet arthritis. Otherwise negative.   C4-5: Mild-to-moderate bilateral facet arthritis. Otherwise negative.   C6-7: Mild facet degenerative change.  Otherwise negative.   C7-T1: Mild bilateral facet degenerative change. Otherwise negative.   IMPRESSION: 1. Mild-to-moderate facet arthritis throughout the cervical spine. 2. Shallow broad-based central and left paracentral protrusion at C3-4 without central canal stenosis. Mild left foraminal narrowing is also seen at this level. The foramina are otherwise open.     Electronically Signed   By: Debby Prader M.D.   On: 03/13/2024 09:07     Objective:  VS:  HT:    WT:   BMI:     BP:114/72  HR:(!) 55bpm  TEMP: ( )  RESP:  Physical Exam Vitals and nursing note reviewed.  Constitutional:      General: He is not in acute distress.    Appearance: Normal appearance. He is not ill-appearing.  HENT:     Head: Normocephalic and atraumatic.     Right Ear: External ear normal.     Left Ear: External ear normal.  Eyes:     Extraocular Movements: Extraocular movements intact.  Cardiovascular:     Rate and Rhythm: Normal rate.     Pulses: Normal pulses.  Abdominal:     General: There is no distension.     Palpations: Abdomen is soft.  Musculoskeletal:        General: No signs of injury.     Cervical back: Neck supple. Tenderness present. No rigidity.     Right lower leg: No edema.     Left lower leg: No edema.     Comments: Patient has good strength in the upper extremities with 5 out of 5 strength in wrist extension long finger flexion APB.  No intrinsic hand muscle atrophy.  Negative Hoffmann's test.   Lymphadenopathy:     Cervical: No cervical adenopathy.  Skin:    Findings: No erythema or rash.  Neurological:     General: No focal deficit present.     Mental Status: He is alert and oriented to person, place, and time.     Sensory: No sensory deficit.     Motor: No weakness or abnormal muscle tone.     Coordination: Coordination normal.  Psychiatric:        Mood and Affect: Mood normal.        Behavior: Behavior normal.      Imaging: No results found. "

## 2024-04-16 NOTE — Procedures (Signed)
 Cervical Facet Joint Intra-Articular Injection with Fluoroscopic Guidance  Patient: Spencer Duffy      Date of Birth: [birth year] MRN: [id number] PCP: Micheal Wolm ORN, MD      Visit Date: 04/16/2024   Universal Protocol:    Date/Time: 12/29/20258:56 AM  Consent Given By: the patient  Position: PRONE  Additional Comments: Vital signs were monitored before and after the procedure. Patient was prepped and draped in the usual sterile fashion. The correct patient, procedure, and site was verified.   Injection Procedure Details:  Procedure Site One Meds Administered:  Meds ordered this encounter  Medications   methylPREDNISolone  acetate (DEPO-MEDROL ) injection 40 mg     Laterality: Bilateral  Location/Site:  C4-5 C5-6  Needle size: 25 G  Needle type: Spinal  Needle Placement: Articular  Findings:  -Contrast Used: 0.5 mL iohexol 180 mg iodine/mL   -Comments: Excellent flow of contrast producing a partial arthrogram.  Procedure Details: The region overlying the facet joints mentioned above were localized under fluoroscopic visualization. The needle was inserted down to the level of the lateral mass of the superior articular process of the facet joint to be injected. Then, the needle was walked off inferiorly into the lateral aspect of the facet joint. Bi-planar images were used for confirming placement and spot radiographs were documented.  A 0.25 ml volume of Omnipaque-240 was injected into the facet joint and a standard partial arthrogram was obtained. Radiographs were obtained of the arthrogram. A 0.5 ml. volume of the steroid/anesthetic solution was injected into the joint. This procedure was repeated for each facet joint injected.   Additional Comments:  The patient tolerated the procedure well Dressing: Band-Aid    Post-procedure details: Patient was observed during the procedure. Post-procedure instructions were reviewed.  Patient left the clinic in stable  condition.

## 2024-05-20 ENCOUNTER — Other Ambulatory Visit: Payer: Self-pay | Admitting: Family Medicine

## 2024-05-22 ENCOUNTER — Ambulatory Visit

## 2024-05-22 VITALS — BP 104/58 | HR 74 | Temp 97.7°F | Ht 72.0 in | Wt 192.2 lb

## 2024-05-22 DIAGNOSIS — G4725 Circadian rhythm sleep disorder, jet lag type: Secondary | ICD-10-CM

## 2024-05-22 DIAGNOSIS — G4733 Obstructive sleep apnea (adult) (pediatric): Secondary | ICD-10-CM | POA: Diagnosis not present

## 2024-05-22 DIAGNOSIS — F5101 Primary insomnia: Secondary | ICD-10-CM | POA: Diagnosis not present

## 2024-05-22 NOTE — Assessment & Plan Note (Addendum)
 Renew lunesta  and Ambien .

## 2024-05-22 NOTE — Progress Notes (Signed)
 "  Pulmonology Office Visit   Subjective:  Patient ID: Spencer Duffy, male    DOB: 1965/07/02  MRN: [id number]  Referred by: Micheal Wolm ORN, MD  CC:  Chief Complaint  Patient presents with   Sleep Apnea    CPAP doing well.  Sleep in good.    HPI Spencer Duffy is a 59 y.o. male non-smoker with OSA, GERD, DM2, insomnia presents for follow-up. Used to follow Dr. Neysa. Dr. Neysa 06/10/2023: Compliance not at 5.  Missing CPAP use during travel.  Respective notes from provider reviewed as appropriate to gather relevant information for patient care.   Discussed the use of AI scribe software for clinical note transcription with the patient, who gave verbal consent to proceed.  History of Present Illness   Spencer Duffy is a 59 year old male with obstructive sleep apnea who presents for follow-up.  He has been using a CPAP machine since 2021 but has not updated it since then. He experiences issues with the machine, including it starting at a high pressure of 20 instead of the usual 6, and problems with the touchscreen. The machine's usage data is inaccurate, showing only 15 minutes of use despite wearing it all night. He travels frequently for work, about 50% of the time, and sometimes takes his CPAP machine with him. He has tried a dental device for travel but found it uncomfortable and ineffective.  He alternates between Lunesta  and Ambien  to aid sleep, taking Lunesta  on a 90-day cycle and Ambien  on a 30-day cycle. He has noticed that his AHI is lower when he takes Ambien  compared to Lunesta , although he is unsure if this is a consistent effect. He has a history of using Ambien  regularly in the past, which led to decreased effectiveness, prompting a switch to Lunesta . He occasionally abstains from these medications to avoid building resistance.  He experiences neck pain due to arthritis in his spine, which disrupts his sleep when he moves at night. He uses exercises and Voltaren  gel to manage the  pain, which helps reduce nighttime awakenings. He typically goes to bed between 10 and 11 PM, falls asleep within 15 minutes with medication, and wakes up at 5:30 AM without an alarm. He does not take naps and generally feels refreshed during the day.  He drinks a couple of cups of coffee in the morning and consumes alcohol minimally, occasionally having a glass of wine with dinner in [place].      OSA history: Diagnosed with OSA many years ago.  Has CPAP as well as oral appliance.  Used to have oral appliance which he is not using. On Ambien  10mg  and Lunesta  3mg  to aid with sleep.Has tried clonazepam  and belsomra .    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   PAP download compliance data: [employer].  Data not available but from app, AHI 5.7. Shows only 25 min/day of use but is using it regularly.  Encore/Airview Pressure: Hours of usage: Days used >4hr: Leak: AHI:  PRIOR TESTS and IMAGING: PSG/HSAT:  HST 09/27/19- AHI 17.2/ hr, desaturation to 82%, body weight 208 lbs      08/30/2019   11:00 AM  Results of the Epworth flowsheet  Sitting and reading 3  Watching TV 1  Sitting, inactive in a public place (e.g. a theatre or a meeting) 2  As a passenger in a car  for an hour without a break 2  Lying down to rest in the afternoon when circumstances permit 3  Sitting and talking to someone 0  Sitting quietly after a lunch without alcohol 1  In a car, while stopped for a few minutes in traffic 1  Total score 13    Allergies: Aleve [naproxen] Current Medications[1] Past Medical History:  Diagnosis Date   Arthritis    Diabetes mellitus    TYPE II   GERD (gastroesophageal reflux disease)    Hay fever    ALLERGIES   Heart murmur    Past Surgical History:  Procedure Laterality Date   BILATERAL KNEE ARTHROSCOPY     X2   ELBOW ARTHROSCOPY WITH TENDON RECONSTRUCTION Right  12/22/2016   Neuroplasty Digital Nerve Right 06/21/2013   @ [facility]   ROTATOR CUFF REPAIR Bilateral    SKIN CANCER EXCISION     ON CHEST   Tarsal Exostectomy Right 06/21/2013   @ [facility]   Family History  Problem Relation Age of Onset   Arthritis Other    Hypertension Other    Diabetes Other        PARENT, GRANDPARENT   Lung cancer Father    Heart attack Father    Colon cancer Paternal Grandmother        81's   Diabetes Paternal Grandmother    Diabetes Sister    Head & neck cancer Brother    Diabetes Brother    Diabetes Maternal Grandmother    Stroke Paternal Grandfather    Social History   Socioeconomic History   Marital status: Married    Spouse name: Not on file   Number of children: Not on file   Years of education: Not on file   Highest education level: Not on file  Occupational History    Employer: [employer]  Tobacco Use   Smoking status: Never    Passive exposure: Past   Smokeless tobacco: Former  Building Services Engineer status: Never Used  Substance and Sexual Activity   Alcohol use: Yes    Comment: wine occ    Drug use: No   Sexual activity: Not on file  Other Topics Concern   Not on file  Social History Narrative   Not on file   Social Drivers of Health   Tobacco Use: Medium Risk (05/22/2024)   Patient History    Smoking Tobacco Use: Never    Smokeless Tobacco Use: Former    Passive Exposure: Past  Programmer, Applications: Not on [employer] Insecurity: Not on file  Transportation Needs: Not on file  Physical Activity: Not on file  Stress: Not on file  Social Connections: Not on file  Intimate Partner Violence: Not on file  Depression (PHQ2-9): Low Risk (02/06/2024)   Depression (PHQ2-9)    PHQ-2 Score: 0  Alcohol Screen: Not on file  Housing: Not on file  Utilities: Not on file  Health Literacy: Not on file       Objective:  BP (!) 104/58   Pulse 74   Temp 97.7 F (36.5 C) (Temporal)   Ht 6' (1.829 m)   Wt 192 lb 3.2 oz (87.2 kg)   SpO2  98% Comment: room air  BMI 26.07 kg/m  BMI Readings from Last 3 Encounters:  05/22/24 26.07 kg/m  02/06/24 26.80 kg/m  12/26/23 27.43 kg/m    Physical Exam: Physical Exam   ENT: Normal mucosa. No hypertrophy of inferior turbinates. Tonsils are normal sized. Modified  Mallampati score is normal. PULMONARY: Lungs clear to auscultation bilaterally, no adventitious breath sounds. CARDIOVASCULAR: Regular rate and rhythm, S1 S2 normal, no murmurs. ABDOMEN: Abdomen soft, nontender. Bowel sounds are normal. EXTREMITIES: No peripheral edema noted.       Diagnostic Review:  Last metabolic panel Lab Results  Component Value Date   GLUCOSE 118 (H) 02/06/2024   NA 137 02/06/2024   K 4.2 02/06/2024   CL 102 02/06/2024   CO2 27 02/06/2024   BUN 22 02/06/2024   CREATININE 0.82 02/06/2024   GFR 97.73 02/06/2024   CALCIUM  8.9 02/06/2024   PROT 6.4 02/06/2024   ALBUMIN 4.6 02/06/2024   BILITOT 0.6 02/06/2024   ALKPHOS 62 02/06/2024   AST 13 02/06/2024   ALT 14 02/06/2024         Assessment & Plan:   Assessment & Plan OSA (obstructive sleep apnea)     Primary insomnia Renew lunesta  and Ambien .     Circadian rhythm sleep disorder, jet lag type Travels 50% of the year mainly to [place].  Difficult to manage circadian disorder with frequent travel and limited days outside the country.       Assessment and Plan    Obstructive sleep apnea Current CPAP machine malfunctioning, affecting AHI accuracy. Machine over five years old. Discussed cost benefits of new machine purchase versus rental. - Ordered new CPAP machine. - Instructed follow-up appointment within 1-1.5 months after receiving new machine to assess AHI and settings.  Primary insomnia Managed with Lunesta  and Ambien . Difficulty falling asleep without medication. Discussed potential memory loss risks with long-term use. - Continue alternating Lunesta  and Ambien . - Discussed potential memory loss risks with  long-term use.  Circadian rhythm sleep disorder, jet lag type Frequent work travel causing jet lag. Discussed CPAP use during travel to manage sleep apnea and prevent fatigue. - Encouraged CPAP use during travel.       Notes from Dr. Neysa 06/10/2023 reviewed as to gather relevant information for patient care and formulating plan.  He was counselled about not driving while drowsy which is common side effect of sleep related disorders.   Return for 1 month after CPAP setup.   I personally spent a total of 30 minutes in the care of the patient today including preparing to see the patient, getting/reviewing separately obtained history, performing a medically appropriate exam/evaluation, counseling and educating, placing orders, documenting clinical information in the EHR, independently interpreting results, and communicating results.   Joelee Snoke, MD     [1]  Current Outpatient Medications:    empagliflozin  (JARDIANCE ) 10 MG TABS tablet, Take 1 tablet (10 mg total) by mouth daily before breakfast., Disp: 90 tablet, Rfl: 3   Eszopiclone  3 MG TABS, TAKE 1 TABLET AT BEDTIME ASNEEDED FOR SLEEP, Disp: 90 tablet, Rfl: 1   metFORMIN  (GLUCOPHAGE ) 500 MG tablet, TAKE 1 TABLET TWICE DAILY  WITH MEALS, Disp: 180 tablet, Rfl: 3   Multiple Vitamin (MULTIVITAMIN) tablet, Take 1 tablet by mouth daily., Disp: , Rfl:    ramipril  (ALTACE ) 5 MG capsule, TAKE 1 CAPSULE DAILY, Disp: 90 capsule, Rfl: 1   rosuvastatin  (CRESTOR ) 10 MG tablet, Take 1 tablet (10 mg total) by mouth daily., Disp: 90 tablet, Rfl: 3   scopolamine  (TRANSDERM-SCOP) 1 MG/3DAYS, Place 1 patch (1.5 mg total) onto the skin every 3 (three) days., Disp: 4 patch, Rfl: 0   zolpidem  (AMBIEN ) 10 MG tablet, TAKE 1 TABLET BY MOUTH AT BEDTIME AS NEEDED FOR SLEEP, Disp: 30 tablet, Rfl: 5  "

## 2024-05-22 NOTE — Patient Instructions (Signed)
" °  VISIT SUMMARY: During your visit, we discussed your obstructive sleep apnea, primary insomnia, and circadian rhythm sleep disorder related to frequent travel. We reviewed your current treatments and made adjustments to improve your sleep quality.  YOUR PLAN: -OBSTRUCTIVE SLEEP APNEA: Obstructive sleep apnea is a condition where your airway becomes blocked during sleep, causing breathing interruptions. Your current CPAP machine is malfunctioning, so we have ordered a new one for you. Please follow up within 1-1.5 months after receiving the new machine to assess your AHI and settings.  -PRIMARY INSOMNIA: Primary insomnia is difficulty falling or staying asleep. You are currently managing this with Lunesta  and Ambien . Continue alternating these medications, but be aware of the potential memory loss risks with long-term use.  -CIRCADIAN RHYTHM SLEEP DISORDER, JET LAG TYPE: This disorder is caused by frequent travel across time zones, leading to sleep disturbances. Continue using your CPAP machine during travel to manage your sleep apnea and prevent fatigue.  INSTRUCTIONS: Please follow up within 1-1.5 months after receiving your new CPAP machine to assess your AHI and settings.    Contains text generated by Abridge.   "

## 2024-06-11 ENCOUNTER — Ambulatory Visit: Payer: 59 | Admitting: Internal Medicine

## 2024-07-10 ENCOUNTER — Encounter
